# Patient Record
Sex: Female | Born: 1965 | Race: Black or African American | Hispanic: No | Marital: Married | Smoking: Never smoker
Health system: Southern US, Community
[De-identification: ages and names within clinical notes are randomized; demographics above are authoritative.]

## PROBLEM LIST (undated history)

## (undated) DIAGNOSIS — J209 Acute bronchitis, unspecified: Secondary | ICD-10-CM

## (undated) DIAGNOSIS — J45909 Unspecified asthma, uncomplicated: Secondary | ICD-10-CM

## (undated) DIAGNOSIS — R7303 Prediabetes: Secondary | ICD-10-CM

## (undated) DIAGNOSIS — D649 Anemia, unspecified: Secondary | ICD-10-CM

## (undated) DIAGNOSIS — E7800 Pure hypercholesterolemia, unspecified: Secondary | ICD-10-CM

## (undated) DIAGNOSIS — G501 Atypical facial pain: Secondary | ICD-10-CM

## (undated) DIAGNOSIS — M542 Cervicalgia: Secondary | ICD-10-CM

## (undated) DIAGNOSIS — K219 Gastro-esophageal reflux disease without esophagitis: Secondary | ICD-10-CM

## (undated) DIAGNOSIS — J309 Allergic rhinitis, unspecified: Secondary | ICD-10-CM

## (undated) DIAGNOSIS — E119 Type 2 diabetes mellitus without complications: Secondary | ICD-10-CM

## (undated) DIAGNOSIS — R569 Unspecified convulsions: Secondary | ICD-10-CM

## (undated) DIAGNOSIS — R51 Headache: Secondary | ICD-10-CM

## (undated) HISTORY — DX: Cervicalgia: M54.2

## (undated) HISTORY — DX: Acute bronchitis, unspecified: J20.9

## (undated) HISTORY — DX: Allergic rhinitis, unspecified: J30.9

## (undated) HISTORY — PX: ABDOMINAL HYSTERECTOMY: SHX81

## (undated) HISTORY — DX: Headache: R51

## (undated) HISTORY — DX: Gastro-esophageal reflux disease without esophagitis: K21.9

## (undated) HISTORY — DX: Unspecified asthma, uncomplicated: J45.909

## (undated) HISTORY — DX: Atypical facial pain: G50.1

## (undated) HISTORY — DX: Unspecified convulsions: R56.9

## (undated) HISTORY — DX: Pure hypercholesterolemia, unspecified: E78.00

## (undated) HISTORY — DX: Type 2 diabetes mellitus without complications: E11.9

## (undated) HISTORY — PX: EYE SURGERY: SHX253

---

## 1997-12-05 ENCOUNTER — Encounter: Admission: RE | Admit: 1997-12-05 | Discharge: 1998-03-05 | Payer: Self-pay | Admitting: Family Medicine

## 1998-08-16 ENCOUNTER — Emergency Department (HOSPITAL_COMMUNITY): Admission: EM | Admit: 1998-08-16 | Discharge: 1998-08-16 | Payer: Self-pay | Admitting: Emergency Medicine

## 2000-08-20 ENCOUNTER — Encounter: Payer: Self-pay | Admitting: *Deleted

## 2000-08-20 ENCOUNTER — Ambulatory Visit (HOSPITAL_COMMUNITY): Admission: RE | Admit: 2000-08-20 | Discharge: 2000-08-20 | Payer: Self-pay | Admitting: *Deleted

## 2000-09-04 ENCOUNTER — Emergency Department (HOSPITAL_COMMUNITY): Admission: EM | Admit: 2000-09-04 | Discharge: 2000-09-04 | Payer: Self-pay | Admitting: Internal Medicine

## 2000-12-24 ENCOUNTER — Encounter (INDEPENDENT_AMBULATORY_CARE_PROVIDER_SITE_OTHER): Payer: Self-pay | Admitting: *Deleted

## 2000-12-24 ENCOUNTER — Inpatient Hospital Stay (HOSPITAL_COMMUNITY): Admission: AD | Admit: 2000-12-24 | Discharge: 2000-12-27 | Payer: Self-pay | Admitting: *Deleted

## 2000-12-24 ENCOUNTER — Encounter: Payer: Self-pay | Admitting: *Deleted

## 2000-12-31 ENCOUNTER — Inpatient Hospital Stay (HOSPITAL_COMMUNITY): Admission: AD | Admit: 2000-12-31 | Discharge: 2000-12-31 | Payer: Self-pay | Admitting: *Deleted

## 2001-04-02 ENCOUNTER — Other Ambulatory Visit: Admission: RE | Admit: 2001-04-02 | Discharge: 2001-04-02 | Payer: Self-pay | Admitting: Obstetrics and Gynecology

## 2001-10-10 ENCOUNTER — Emergency Department (HOSPITAL_COMMUNITY): Admission: EM | Admit: 2001-10-10 | Discharge: 2001-10-10 | Payer: Self-pay | Admitting: Emergency Medicine

## 2003-12-20 ENCOUNTER — Other Ambulatory Visit: Admission: RE | Admit: 2003-12-20 | Discharge: 2003-12-20 | Payer: Self-pay | Admitting: Obstetrics and Gynecology

## 2004-01-11 ENCOUNTER — Inpatient Hospital Stay (HOSPITAL_COMMUNITY): Admission: RE | Admit: 2004-01-11 | Discharge: 2004-01-14 | Payer: Self-pay | Admitting: Obstetrics and Gynecology

## 2004-01-11 ENCOUNTER — Encounter (INDEPENDENT_AMBULATORY_CARE_PROVIDER_SITE_OTHER): Payer: Self-pay | Admitting: Specialist

## 2004-01-11 ENCOUNTER — Encounter (INDEPENDENT_AMBULATORY_CARE_PROVIDER_SITE_OTHER): Payer: Self-pay | Admitting: *Deleted

## 2004-01-29 HISTORY — PX: VESICOVAGINAL FISTULA CLOSURE W/ TAH: SUR271

## 2004-01-29 HISTORY — PX: CYSTECTOMY: SUR359

## 2004-10-18 ENCOUNTER — Ambulatory Visit: Payer: Self-pay | Admitting: Pulmonary Disease

## 2005-03-10 ENCOUNTER — Ambulatory Visit: Payer: Self-pay | Admitting: Pulmonary Disease

## 2006-03-13 ENCOUNTER — Ambulatory Visit: Payer: Self-pay | Admitting: Pulmonary Disease

## 2006-05-06 ENCOUNTER — Emergency Department (HOSPITAL_COMMUNITY): Admission: EM | Admit: 2006-05-06 | Discharge: 2006-05-06 | Payer: Self-pay | Admitting: Emergency Medicine

## 2006-05-14 ENCOUNTER — Ambulatory Visit: Payer: Self-pay | Admitting: Pulmonary Disease

## 2007-05-20 ENCOUNTER — Ambulatory Visit: Payer: Self-pay | Admitting: Pulmonary Disease

## 2007-06-30 DIAGNOSIS — J309 Allergic rhinitis, unspecified: Secondary | ICD-10-CM | POA: Insufficient documentation

## 2007-06-30 DIAGNOSIS — K219 Gastro-esophageal reflux disease without esophagitis: Secondary | ICD-10-CM

## 2007-10-13 ENCOUNTER — Ambulatory Visit: Payer: Self-pay | Admitting: Pulmonary Disease

## 2007-10-13 ENCOUNTER — Encounter: Payer: Self-pay | Admitting: Adult Health

## 2007-10-13 DIAGNOSIS — M542 Cervicalgia: Secondary | ICD-10-CM

## 2007-10-15 LAB — CONVERTED CEMR LAB
ALT: 21 units/L (ref 0–35)
Basophils Relative: 0.6 % (ref 0.0–1.0)
Bilirubin, Direct: 0.2 mg/dL (ref 0.0–0.3)
CO2: 29 meq/L (ref 19–32)
Eosinophils Absolute: 0.2 10*3/uL (ref 0.0–0.7)
GFR calc non Af Amer: 73 mL/min
Hemoglobin: 13.6 g/dL (ref 12.0–15.0)
Lymphocytes Relative: 24.9 % (ref 12.0–46.0)
MCHC: 33.9 g/dL (ref 30.0–36.0)
MCV: 88.4 fL (ref 78.0–100.0)
Monocytes Absolute: 0.8 10*3/uL (ref 0.1–1.0)
Monocytes Relative: 6.6 % (ref 3.0–12.0)
Neutro Abs: 7.8 10*3/uL — ABNORMAL HIGH (ref 1.4–7.7)
Neutrophils Relative %: 65.8 % (ref 43.0–77.0)
Platelets: 210 10*3/uL (ref 150–400)
RBC: 4.53 M/uL (ref 3.87–5.11)
RDW: 12.6 % (ref 11.5–14.6)
TSH: 1.34 microintl units/mL (ref 0.35–5.50)
WBC: 11.9 10*3/uL — ABNORMAL HIGH (ref 4.5–10.5)

## 2007-12-22 ENCOUNTER — Ambulatory Visit: Payer: Self-pay | Admitting: Pulmonary Disease

## 2007-12-22 DIAGNOSIS — J209 Acute bronchitis, unspecified: Secondary | ICD-10-CM

## 2007-12-22 DIAGNOSIS — R519 Headache, unspecified: Secondary | ICD-10-CM | POA: Insufficient documentation

## 2007-12-22 DIAGNOSIS — R569 Unspecified convulsions: Secondary | ICD-10-CM | POA: Insufficient documentation

## 2007-12-22 DIAGNOSIS — R51 Headache: Secondary | ICD-10-CM

## 2007-12-22 DIAGNOSIS — G501 Atypical facial pain: Secondary | ICD-10-CM | POA: Insufficient documentation

## 2007-12-26 LAB — CONVERTED CEMR LAB
HDL: 61.5 mg/dL (ref >39.0)
Sed Rate: 19 mm/hr (ref 0–22)
VLDL: 9 mg/dL (ref 0–40)

## 2008-03-30 ENCOUNTER — Telehealth: Payer: Self-pay | Admitting: Pulmonary Disease

## 2008-04-03 ENCOUNTER — Ambulatory Visit: Payer: Self-pay | Admitting: Pulmonary Disease

## 2008-10-27 ENCOUNTER — Ambulatory Visit: Payer: Self-pay | Admitting: Pulmonary Disease

## 2008-10-27 LAB — CONVERTED CEMR LAB
ALT: 22 units/L (ref 0–35)
AST: 23 units/L (ref 0–37)
Alkaline Phosphatase: 38 units/L — ABNORMAL LOW (ref 39–117)
BUN: 14 mg/dL (ref 6–23)
Basophils Absolute: 0 10*3/uL (ref 0.0–0.1)
Basophils Relative: 0.4 % (ref 0.0–3.0)
Bilirubin Urine: NEGATIVE
Calcium: 9.4 mg/dL (ref 8.4–10.5)
Cholesterol: 231 mg/dL — ABNORMAL HIGH (ref 0–200)
Creatinine, Ser: 0.8 mg/dL (ref 0.4–1.2)
Direct LDL: 156.6 mg/dL
Eosinophils Absolute: 0.1 10*3/uL (ref 0.0–0.7)
Glucose, Bld: 95 mg/dL (ref 70–99)
HCT: 36.4 % (ref 36.0–46.0)
HDL: 59.1 mg/dL (ref >39.00)
Leukocytes, UA: NEGATIVE
Lymphocytes Relative: 43.6 % (ref 12.0–46.0)
Lymphs Abs: 2.6 10*3/uL (ref 0.7–4.0)
MCHC: 34 g/dL (ref 30.0–36.0)
Monocytes Absolute: 0.5 10*3/uL (ref 0.1–1.0)
Nitrite: NEGATIVE
Platelets: 211 10*3/uL (ref 150.0–400.0)
RBC: 4.16 M/uL (ref 3.87–5.11)
RDW: 12.8 % (ref 11.5–14.6)
Sodium: 143 meq/L (ref 135–145)
Total CHOL/HDL Ratio: 4
Total Protein, Urine: NEGATIVE mg/dL
Urine Glucose: NEGATIVE mg/dL
Urobilinogen, UA: 0.2 (ref 0.0–1.0)
pH: 6.5 (ref 5.0–8.0)

## 2008-11-10 ENCOUNTER — Emergency Department (HOSPITAL_BASED_OUTPATIENT_CLINIC_OR_DEPARTMENT_OTHER): Admission: EM | Admit: 2008-11-10 | Discharge: 2008-11-11 | Payer: Self-pay | Admitting: Emergency Medicine

## 2009-02-10 ENCOUNTER — Encounter: Payer: Self-pay | Admitting: Pulmonary Disease

## 2009-05-07 ENCOUNTER — Ambulatory Visit: Payer: Self-pay | Admitting: Pulmonary Disease

## 2009-05-11 ENCOUNTER — Ambulatory Visit: Payer: Self-pay | Admitting: Pulmonary Disease

## 2009-05-15 ENCOUNTER — Telehealth: Payer: Self-pay | Admitting: Pulmonary Disease

## 2009-05-15 LAB — CONVERTED CEMR LAB
ALT: 18 units/L (ref 0–35)
AST: 19 units/L (ref 0–37)
Bilirubin, Direct: 0.1 mg/dL (ref 0.0–0.3)
Cholesterol: 251 mg/dL — ABNORMAL HIGH (ref 0–200)
Total CHOL/HDL Ratio: 4
Total Protein: 7.9 g/dL (ref 6.0–8.3)
VLDL: 11.6 mg/dL (ref 0.0–40.0)

## 2009-06-01 ENCOUNTER — Emergency Department (HOSPITAL_BASED_OUTPATIENT_CLINIC_OR_DEPARTMENT_OTHER): Admission: EM | Admit: 2009-06-01 | Discharge: 2009-06-01 | Payer: Self-pay | Admitting: Emergency Medicine

## 2010-02-11 ENCOUNTER — Ambulatory Visit: Payer: Self-pay | Admitting: Pulmonary Disease

## 2010-02-11 ENCOUNTER — Ambulatory Visit (HOSPITAL_COMMUNITY): Admission: RE | Admit: 2010-02-11 | Discharge: 2010-02-11 | Payer: Self-pay | Admitting: Gastroenterology

## 2010-02-12 ENCOUNTER — Ambulatory Visit: Payer: Self-pay | Admitting: Pulmonary Disease

## 2010-02-18 LAB — CONVERTED CEMR LAB
ALT: 20 units/L (ref 0–35)
Alkaline Phosphatase: 36 units/L — ABNORMAL LOW (ref 39–117)
BUN: 14 mg/dL (ref 6–23)
Basophils Absolute: 0 10*3/uL (ref 0.0–0.1)
Bilirubin Urine: NEGATIVE
Bilirubin, Direct: 0.1 mg/dL (ref 0.0–0.3)
CO2: 28 meq/L (ref 19–32)
Calcium: 9.6 mg/dL (ref 8.4–10.5)
Chloride: 104 meq/L (ref 96–112)
Creatinine, Ser: 0.8 mg/dL (ref 0.4–1.2)
GFR calc non Af Amer: 94.58 mL/min (ref >60)
Hemoglobin: 12.7 g/dL (ref 12.0–15.0)
Lymphocytes Relative: 46.4 % — ABNORMAL HIGH (ref 12.0–46.0)
Lymphs Abs: 3.4 10*3/uL (ref 0.7–4.0)
MCV: 87.4 fL (ref 78.0–100.0)
Monocytes Absolute: 0.6 10*3/uL (ref 0.1–1.0)
Monocytes Relative: 7.7 % (ref 3.0–12.0)
Neutro Abs: 3.2 10*3/uL (ref 1.4–7.7)
Neutrophils Relative %: 43.7 % (ref 43.0–77.0)
Platelets: 241 10*3/uL (ref 150.0–400.0)
RBC: 4.24 M/uL (ref 3.87–5.11)
RDW: 14 % (ref 11.5–14.6)
Sodium: 141 meq/L (ref 135–145)
Specific Gravity, Urine: 1.02 (ref 1.000–1.030)
Total Protein, Urine: NEGATIVE mg/dL
Triglycerides: 79 mg/dL (ref 0.0–149.0)
VLDL: 15.8 mg/dL (ref 0.0–40.0)
WBC: 7.3 10*3/uL (ref 4.5–10.5)
pH: 6.5 (ref 5.0–8.0)

## 2010-02-28 ENCOUNTER — Ambulatory Visit (HOSPITAL_COMMUNITY): Admission: RE | Admit: 2010-02-28 | Discharge: 2010-02-28 | Payer: Self-pay | Admitting: Gastroenterology

## 2010-03-20 ENCOUNTER — Encounter: Payer: Self-pay | Admitting: Pulmonary Disease

## 2010-04-01 ENCOUNTER — Encounter: Admission: RE | Admit: 2010-04-01 | Discharge: 2010-04-01 | Payer: Self-pay | Admitting: Emergency Medicine

## 2010-04-16 ENCOUNTER — Encounter: Payer: Self-pay | Admitting: Pulmonary Disease

## 2010-04-18 ENCOUNTER — Encounter: Admission: RE | Admit: 2010-04-18 | Discharge: 2010-04-18 | Payer: Self-pay | Admitting: General Surgery

## 2010-05-08 ENCOUNTER — Encounter: Payer: Self-pay | Admitting: Pulmonary Disease

## 2010-05-30 HISTORY — PX: LAPAROSCOPIC CHOLECYSTECTOMY: SUR755

## 2010-06-06 ENCOUNTER — Encounter: Payer: Self-pay | Admitting: Pulmonary Disease

## 2010-06-19 ENCOUNTER — Encounter: Payer: Self-pay | Admitting: Pulmonary Disease

## 2010-07-30 NOTE — Assessment & Plan Note (Signed)
Summary: physical ///kp   CC:  9 month ROV & CPX....  History of Present Illness: 45 y/o BF here for a follow up visit & CPX... she has multiple medical problems as noted below...    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 she states that it started w/ right ear discomfort (no fever, drainage, etc) w/ radiation to her right neck (sternocleidomastoid area to it's insertion on the clavicle)... she has been evaluated at the [facility] on several occas and given antibiotics, nasal sprays, etc- ?some mild relief initially but symptoms always return... she has seen the nurse practitioner w/ Ibuprofen trial- again w/ mild improvement but not consistent relief... the pain in her ear occurs on most days, mod 4-5/10, lasts for sec's to min's, and recurs throughout the day... the neck discomfort seems to be worse in the AM, but is not activity or motion related... she denies visual symptoms, headaches, temporal area pain, or classic tic doloreaux symptomatology... Rx w/ Rest, Heat, Soma250, Vicodin- some improvement but symptoms persist... referred to Neurology but couldn't get appt due to her acct... she will take care of this & set up her appt...   ~  October 27, 2008:  here for CPX- she never f/u w/ Neurology and c/o severe HA recently- we discussed need for Neuro eval and offered to make referral for her but she wants to take care of this on her own (she never did)... using SOMA & VICODIN we have perscribed in the past...   ~  May 07, 2009:  add-on for sinusitis symptoms and to recheck FLP on Simva40 now... tolerating satis, wt= 190# up 3# since last OV... we discussed Rx w/ Depo80, Dosepak, Augmentin, Mucinex, etc...   ~  February 11, 2010:  her for CPX- c/o "stomach issues" x2mo w/ n/v & wakes from sleep once per week... went to [facility], sent to [facility] & w/u in progress w/ gastric emptying scan pending (we don't have records & pt will request notes sent to [place] to review)... she  also has mult somatic complaints- legs ache etc> she stopped Simvastatin, & is taking SOMA 250 + VICODIN Prn.  Current Problem List:  ALLERGIC RHINITIS (ICD-477.9) - she uses OTC antihist and Saline Prn...  Hx of ASTHMATIC BRONCHITIS, ACUTE (ICD-466.0) - takes SYMBICORT 80 2spBid- but just using it Prn (I use it w/ activities)... doing well & denies cough, sputum, hemoptysis, worsening dyspnea,  wheezing, chest pains, snoring, daytime hypersomnolence, etc...   HYPERCHOLESTEROLEMIA, BORDERLINE (ICD-272.4) - prev on Simvastatin but stopped on her own, using diet alone now.  ~  FLP 9/07 showed TChol 219, TG 45, HDL 53, LDL 151...  ~  FLP 6/09 showed TChol 256, TG 46, HDL 62, LDL 176... rec to start Simvast40 but she never filled Rx.  ~  OV 10/09 Rx written for Simvastatin 40mg Qhs...  she states she filled it but not taking regularly.  ~  FLP 4/10 showed TChol 231, TG 69, HDL 59, LDL 157... rec> take Simva40 daily.  ~  FLP 11/10 showed TChol 251, TG 51, HDL 63, LDL 175... rec> incr Simva80, but she stopped on her own.  ~  FLP 8/11 showed TChol 228, TG 79, HDL 57, LDL 165... rec> get back on Simva40 & take every day!  GERD (ICD-530.81) - occas GERD symptoms in the past treated w/ OTC Zantac or Prilosec... no recent problems- swallowing OK, no heartburn, abd pain, n/v, etc...  Hx of HEADACHE (ICD-784.0) -  hx of headaches and neck pain evaluated by Autumn Patty in 1999...  Hx of OTHER CONVULSIONS (ICD-780.39) - she was evaluated by DrStiefel in 1999 for some unusual spells w/ shaking of her left arm and hand, plus paresthesias of her hand and face...full eval w/ MRI and EEG were normal...she was evaluated by DrHickling in 2002 for seizure-like activity around the time of her delivery... 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  ~  OV 4/10: she notes that she still has spells ave 1-2 per month- she feels funny/ eyes twitch/ "I  know when it's going to happen and I sit down", no LOC etc...  FACIAL PAIN, ATYPICAL (ICD-350.2)  ***  SEE ABOVE  *** she never went to see Neurology... NECK PAIN, RIGHT (ICD-723.1)  ~  she takes SOMA 250 Tid Prn and VICODIN Tid Prn for pain...  Health Maintenance -  she states that she needs a GYN doctor- given names and she will call... she had a TAH for fibroids and a left ovarian cystectomy (benign cystic teratoma) in 2005 by DrGottsegen...   Preventive Screening-Counseling & Management  Alcohol-Tobacco     Smoking Status: never  Allergies (verified): No Known Drug Allergies  Comments:  Nurse/Medical Assistant: The patient's medications and allergies were reviewed with the patient and were updated in the Medication and Allergy Lists.  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 Father died- didn't know him, but he had hx of DM Mother alive age 72 w/ HBP 4 Siblings: 2 Brothers- alive & well 2 Sisters- 1= Kristine Linea age __ w/ asthma, allergies, HBP, FM...  Social History: Reviewed history from 10/27/2008 and no changes required. Divorced 3 children- hx asthma, borderline DM Never Smoked Social alcohol Employ= [employer]/ [employer], [employer]  Review of Systems       The patient complains of malaise, nausea, gas/bloating, muscle cramps, and anxiety.  The patient denies fever, chills, sweats, anorexia, fatigue, weakness, weight loss, sleep disorder, blurring, diplopia, eye irritation, eye discharge, vision loss, eye pain,  photophobia, earache, ear discharge, tinnitus, decreased hearing, nasal congestion, nosebleeds, sore throat, hoarseness, chest pain, palpitations, syncope, dyspnea on exertion, orthopnea, PND, peripheral edema, cough, dyspnea at rest, excessive sputum, hemoptysis, wheezing, pleurisy, vomiting, diarrhea, constipation, change in bowel habits, abdominal pain, melena, hematochezia, indigestion/heartburn, dysphagia, odynophagia, dysuria, hematuria, urinary frequency, urinary hesitancy, nocturia, incontinence, back pain, joint pain, joint swelling, muscle weakness, stiffness, arthritis, sciatica, restless legs, leg pain at night, leg pain with exertion, rash, itching, dryness, suspicious lesions, paralysis, paresthesias, seizures, tremors, vertigo, transient blindness, frequent falls, frequent headaches, difficulty walking, depression, memory loss, confusion, cold intolerance, heat intolerance, polydipsia, polyphagia, polyuria, unusual weight change, abnormal bruising, bleeding, enlarged lymph nodes, urticaria, allergic rash, hay fever, and recurrent infections.    Vital Signs:  Patient profile:   45 year old female Height:      70 inches Weight:      207 pounds BMI:     29.81 O2 Sat:      100 % on Room air Temp:     98.0 degrees F oral Pulse rate:  54 / minute BP sitting:   122 / 82  (left arm) Cuff size:   regular  Vitals Entered By: Randell Loop CMA (February 11, 2010 11:03 AM)  O2 Sat at Rest %:  100 O2 Flow:  Room air CC: 9 month ROV & CPX... Is Patient Diabetic? No Pain Assessment Patient in pain? yes      Onset of pain  bil leg pain since sat morning Comments meds udpated today with pt   Physical Exam  Additional Exam:  WD, WN, 44 y/o BF in NAD... GENERAL:  Alert & oriented; pleasant & cooperative... HEENT:  [place]/AT, EOM-wnl, PERRLA, EACs-clear, TMs-wnl, NOSE- sl red, THROAT-clear & wnl. NECK:  Supple w/ fairROM; no JVD; normal carotid impulses w/o bruits; no thyromegaly or nodules  palpated; no lymphadenopathy. CHEST:  Clear to P & A; without wheezes/ rales/ or rhonchi. HEART:  Regular Rhythm; without murmurs/ rubs/ or gallops. ABDOMEN:  Soft & nontender; normal bowel sounds; no organomegaly or masses detected. EXT: without deformities or arthritic changes; no varicose veins/ venous insuffic/ or edema. NEURO:  CN's intact; motor testing normal; sensory testing normal; gait normal & balance OK. DERM:  No lesions noted; no rash etc...    CXR  Procedure date:  02/11/2010  Findings:      CHEST - 2 VIEW Comparison: 10/27/2008   Findings: Heart and mediastinal contours are within normal limits. No focal opacities or effusions.  No acute bony abnormality.   IMPRESSION: No acute cardiopulmonary disease.   Read By:  Charlett Nose,  M.D   EKG  Procedure date:  02/11/2010  Findings:      Sinus bradycardia with rate of:  56/ min... Tracing is WNL, NAD...  SN   MISC. Report  Procedure date:  02/12/2010  Findings:      Lipid Panel (LIPID)   Cholesterol          [H]  228 mg/dL                   0-454   Triglycerides             79.0 mg/dL                  0.9-811.9   HDL                       14.78 mg/dL                 >29.56 Cholesterol LDL - Direct                             164.5 mg/dL  BMP (METABOL)   Sodium                    141 mEq/L                   135-145   Potassium                 4.5 mEq/L                   3.5-5.1   Chloride                  104 mEq/L                   96-112   Carbon Dioxide  28 mEq/L                    19-32   Glucose                   94 mg/dL                    16-10   BUN                       14 mg/dL                    9-60   Creatinine                0.8 mg/dL                   4.5-4.0   Calcium                   9.6 mg/dL                   9.8-11.9   GFR                       94.58 mL/min                >60  Hepatic/Liver Function Panel (HEPATIC)   Total Bilirubin           0.5 mg/dL                    1.4-7.8   Direct Bilirubin          0.1 mg/dL                   2.9-5.6   Alkaline Phosphatase [L]  36 U/L                      39-117   AST                       23 U/L                      0-37   ALT                       20 U/L                      0-35   Total Protein             7.3 g/dL                    2.1-3.0   Albumin                   4.3 g/dL                    8.6-5.7  Comments:      CBC Platelet w/Diff (CBCD)   White Cell Count          7.3 K/uL                    4.5-10.5   Red Cell Count            4.24 Mil/uL  3.87-5.11   Hemoglobin                12.7 g/dL                   16.1-09.6   Hematocrit                37.1 %                      36.0-46.0   MCV                       87.4 fl                     78.0-100.0   Platelet Count            241.0 K/uL                  150.0-400.0   Neutrophil %              43.7 %                      43.0-77.0   Lymphocyte %         [H]  46.4 %                      12.0-46.0   Monocyte %                7.7 %                       3.0-12.0   Eosinophils%              1.5 %                       0.0-5.0   Basophils %               0.7 %                       0.0-3.0  TSH (TSH)   FastTSH                   1.38 uIU/mL                 0.35-5.50  UDip Only (UDIP)   Color                     LT. YELLOW   Clarity                   CLEAR                       Clear   Specific Gravity          1.020                       1.000 - 1.030   Urine Ph                  6.5                         5.0-8.0   Protein                   NEGATIVE  Negative   Urine Glucose             NEGATIVE                    Negative   Ketones                   NEGATIVE                    Negative   Urine Bilirubin           NEGATIVE                    Negative   Blood                     NEGATIVE                    Negative   Urobilinogen              0.2                         0.0 - 1.0   Leukocyte Esterace        NEGATIVE                     Negative   Nitrite                   NEGATIVE                    Negative   Impression & Recommendations:  Problem # 1:  PHYSICAL EXAMINATION (ICD-V70.0)  Orders: EKG w/ Interpretation (93000) T-2 View CXR (71020TC)  Problem # 2:  Hx of ASTHMATIC BRONCHITIS, ACUTE (ICD-466.0) Continue Symbicort... Her updated medication list for this problem includes:    Symbicort 80-4.5 Mcg/act Aero (Budesonide-formoterol fumarate) .Marland Kitchen... 2 inhalations twice daily as directed...  Problem # 3:  HYPERCHOLESTEROLEMIA, BORDERLINE (ICD-272.4) She needs to take the med regularly so we can assess it's efficacy & determine if she needs a stronger statin vs Lipid Clinic referral... Her updated medication list for this problem includes:    Simvastatin 40 Mg Tabs (Simvastatin) .Marland Kitchen... Take 1 tab by mouth at bedtime...  Problem # 4:  GERD (ICD-530.81) New "stomach issues" being eval by Danne Vasek [facility] per pt hx & she will request data to be forwarded to [place] for her records... REC> continue eval & Rx from GI...  Problem # 5:  Hx of HEADACHE (ICD-784.0) Hx HA, "convulsions" & atyp facial pain> should be managed by Neuro & we tried several times to ret her to Neuro for eval but she hasn't followed up w/ them... Her updated medication list for this problem includes:    Vicodin 5-500 Mg Tabs (Hydrocodone-acetaminophen) .Marland Kitchen... Take 1 tab by mouth up to 3 times per day as needed for pain...  Problem # 6:  OTHER MEDICAL PROBLEMS AS NOTED>>>  Complete Medication List: 1)  Symbicort 80-4.5 Mcg/act Aero (Budesonide-formoterol fumarate) .... 2 inhalations twice daily as directed... 2)  Simvastatin 40 Mg Tabs (Simvastatin) .... Take 1 tab by mouth at bedtime.Marland KitchenMarland Kitchen 3)  Vicodin 5-500 Mg Tabs (Hydrocodone-acetaminophen) .... Take 1 tab by mouth up to 3 times per day as needed for pain.Marland KitchenMarland Kitchen 4)  Soma 250 Mg Tabs (Carisoprodol) .... Take 1 tab by mouth up to three times per day as needed for muscle spasm... 5)   Multivitamins Tabs (  Multiple vitamin) .... Take 1 tablet by mouth once a day  Patient Instructions: 1)  Today we updated your med list- see below.... 2)  We refilled your meds as discussed... 3)  Today we did your follow up CXR & EKG... 4)  Please return to our lab one morning this week for your FASTING blood work...  please call the "phone tree" in a few days for your lab results.Marland KitchenMarland Kitchen 5)  Let's get on track w/ our diet (try weight watchers) & exercise program... 6)  Call for any questions.Marland KitchenMarland Kitchen 7)  Please schedule a follow-up appointment in 6 months. Prescriptions: SOMA 250 MG  TABS (CARISOPRODOL) take 1 tab by mouth up to three times per day as needed for muscle spasm...  #90 x 6   Entered and Authorized by:   Michele Mcalpine MD   Signed by:   Michele Mcalpine MD on 02/11/2010   Method used:   Print then Give to Patient   RxID:   [id number] VICODIN 5-500 MG  TABS (HYDROCODONE-ACETAMINOPHEN) take 1 tab by mouth up to 3 times per day as needed for pain...  #90 x 6   Entered and Authorized by:   Michele Mcalpine MD   Signed by:   Michele Mcalpine MD on 02/11/2010   Method used:   Print then Give to Patient   RxID:   [id number] SIMVASTATIN 40 MG TABS (SIMVASTATIN) take 1 tab by mouth at bedtime...  #30 x prn   Entered and Authorized by:   Michele Mcalpine MD   Signed by:   Michele Mcalpine MD on 02/11/2010   Method used:   Print then Give to Patient   RxID:   [id number] SYMBICORT 80-4.5 MCG/ACT  AERO (BUDESONIDE-FORMOTEROL FUMARATE) 2 inhalations twice daily as directed...  #1 x prn   Entered and Authorized by:   Michele Mcalpine MD   Signed by:   Michele Mcalpine MD on 02/11/2010   Method used:   Print then Give to Patient   RxID:   [id number]    Orders Added: 1)  EKG w/ Interpretation [93000] 2)  Est. Patient 40-64 years [99396] 3)  T-2 View CXR [71020TC]    Immunization History:  Influenza Immunization History:    Influenza:  historical (04/12/2009)    CardioPerfect  ECG  ID: [id number] Patient: JACOBI, RYANT DOB: 1966/03/15 Age: 45 Years Old Sex: Female Race: Black Physician: scott nadel Technician: Randell Loop CMA Height: 70 Weight: 207 Status: Unconfirmed Past Medical History:  ALLERGIC RHINITIS (ICD-477.9) Hx of ASTHMATIC BRONCHITIS, ACUTE (ICD-466.0) HYPERCHOLESTEROLEMIA, BORDERLINE (ICD-272.4) GERD (ICD-530.81) Hx of HEADACHE (ICD-784.0) Hx of OTHER CONVULSIONS (ICD-780.39) FACIAL PAIN, ATYPICAL (ICD-350.2) NECK PAIN, RIGHT (ICD-723.1)   Recorded: 02/11/2010 11:28 AM P/PR: 118 ms / 175 ms - Heart rate (maximum exercise) QRS: 88 QT/QTc/QTd: 402 ms / 393 ms / 24 ms - Heart rate (maximum exercise)  P/QRS/T axis: 45 deg / 48 deg / 20 deg - Heart rate (maximum exercise)  Heartrate: 55 bpm  Interpretation:  Sinus bradycardia with rate of:  56/ min... Tracing is WNL, NAD...  SN

## 2010-07-30 NOTE — Consult Note (Signed)
Summary: [facility]  [facility]   Imported By: Sherian Rein 03/28/2010 07:59:23  _____________________________________________________________________  External Attachment:    Type:   Image     Comment:   External Document

## 2010-07-30 NOTE — Letter (Signed)
Summary: [facility]   Imported By: Sherian Rein 05/11/2010 09:57:07  _____________________________________________________________________  External Attachment:    Type:   Image     Comment:   External Document

## 2010-07-30 NOTE — Letter (Signed)
Summary: [facility]   Imported By: Sherian Rein 05/31/2010 13:58:35  _____________________________________________________________________  External Attachment:    Type:   Image     Comment:   External Document

## 2010-08-01 NOTE — Op Note (Signed)
Summary: Adolph Pollack MD  Adolph Pollack MD   Imported By: Lester Nikolaevsk 06/27/2010 08:59:07  _____________________________________________________________________  External Attachment:    Type:   Image     Comment:   External Document

## 2010-08-01 NOTE — Letter (Signed)
Summary: [facility]   Imported By: Sherian Rein 07/12/2010 12:35:02  _____________________________________________________________________  External Attachment:    Type:   Image     Comment:   External Document

## 2010-08-02 NOTE — Procedures (Signed)
Summary: EGD/[facility]  EGD/[facility]   Imported By: Sherian Rein 03/28/2010 08:00:36  _____________________________________________________________________  External Attachment:    Type:   Image     Comment:   External Document

## 2010-08-12 ENCOUNTER — Other Ambulatory Visit: Payer: Self-pay | Admitting: Pulmonary Disease

## 2010-08-12 ENCOUNTER — Ambulatory Visit (INDEPENDENT_AMBULATORY_CARE_PROVIDER_SITE_OTHER): Payer: Self-pay | Admitting: Pulmonary Disease

## 2010-08-12 ENCOUNTER — Other Ambulatory Visit: Payer: 59

## 2010-08-12 ENCOUNTER — Encounter: Payer: Self-pay | Admitting: Pulmonary Disease

## 2010-08-12 DIAGNOSIS — J209 Acute bronchitis, unspecified: Secondary | ICD-10-CM

## 2010-08-12 DIAGNOSIS — E785 Hyperlipidemia, unspecified: Secondary | ICD-10-CM

## 2010-08-12 DIAGNOSIS — J069 Acute upper respiratory infection, unspecified: Secondary | ICD-10-CM

## 2010-08-12 DIAGNOSIS — K219 Gastro-esophageal reflux disease without esophagitis: Secondary | ICD-10-CM

## 2010-08-12 DIAGNOSIS — J309 Allergic rhinitis, unspecified: Secondary | ICD-10-CM

## 2010-08-12 LAB — LIPID PANEL: Cholesterol: 227 mg/dL — ABNORMAL HIGH (ref 0–200)

## 2010-08-18 DIAGNOSIS — E1169 Type 2 diabetes mellitus with other specified complication: Secondary | ICD-10-CM | POA: Insufficient documentation

## 2010-08-18 DIAGNOSIS — E7800 Pure hypercholesterolemia, unspecified: Secondary | ICD-10-CM | POA: Insufficient documentation

## 2010-08-27 NOTE — Assessment & Plan Note (Signed)
Summary: 6 month rov/kp   CC:  6 month ROV & review of mult medical problems....  History of Present Illness: 45 y/o BF here for a follow up visit & CPX... she has multiple medical problems as noted below...    ~  February 11, 2010:  her for CPX- c/o "stomach issues" x18mo w/ n/v & wakes from sleep once per week... went to [facility], sent to [facility] & w/u in progress w/ gastric emptying scan pending (we don't have records & pt will request notes sent to [place] to review)... she also has mult somatic complaints- legs ache etc> she stopped Simvastatin, & is taking SOMA 250 + VICODIN Prn.   ~  August 12, 2010:  DrNat-Mann did GI eval w/ EGD showing sm HH, mild esophagitis (Rx Dexilant);  CT Abd neg x divertics & sm ovarian cysts (s/p hyst);  Hepatobil Scan w/ low norm EF & dx w/ biliary dyskinesia & sent to DrRosenbower for Lap Cholecystectomy & lysis of adhesions 12/11> improved...    Complains of URI w/ ST, congestion & drainage, cough w/ yellow sput & low grade fever etc;  we discussed rx w/ Augmentin, Mucinex (declines- "it makes me woozy"), Hydromet, & nasal saline etc...    She has Hypercholesterolemia but has prev been unable to take Simva40 regularly (see below);  she now states she's been on it regularly but FLP today is no better! therefore we will switch to Crestor 20mg /d...   Current Problem List:  ALLERGIC RHINITIS (ICD-477.9) - she uses OTC antihist and Saline Prn...  Hx of ASTHMATIC BRONCHITIS, ACUTE (ICD-466.0) - takes SYMBICORT 80 2spBid- but just using it Prn (I use it w/ activities)... doing well except for recent symptoms (2/12) as above & we are treating w/ Augmentin, Hydromet, she refuses Mucinex...  HYPERCHOLESTEROLEMIA (ICD-272.0) - on Simvastatin 40mg /d- taking it on & off (freq stops on her own)...  ~  FLP 9/07 showed TChol 219, TG 45, HDL 53, LDL 151...  ~  FLP 6/09 showed TChol 256, TG 46, HDL 62, LDL 176... rec to start Simvast40 but she never filled Rx.  ~  OV 10/09 Rx  written for Simvastatin 40mg Qhs...  she states she filled it but not taking regularly.  ~  FLP 4/10 showed TChol 231, TG 69, HDL 59, LDL 157... rec> take Simva40 daily.  ~  FLP 11/10 showed TChol 251, TG 51, HDL 63, LDL 175... rec> incr Simva80, but she stopped on her own.  ~  FLP 8/11 showed TChol 228, TG 79, HDL 57, LDL 165... rec> get back on Simva40 & take every day!  ~  FLP 2/12 on Simva40 daily she says> TChol 227, TG 98, HDL 57, LDL 158... not working, ch to [employer] 20mg /d.  GERD (ICD-530.81) - occas GERD symptoms in the past treated w/ OTC Zantac or Prilosec... seen by [facility] 2011 w/ EGD 9/11 showing sm HH, mild esophagitis, Rx Dexilant & improved.  Hx of HEADACHE (ICD-784.0) - hx of headaches and neck pain evaluated by DrLove in 1999... FACIAL PAIN, ATYPICAL (ICD-350.2) - hx atyp facial pain in 2009, Rx Soma & Vicodin, she never went to Neuro. NECK PAIN, RIGHT (ICD-723.1) - she takes SOMA 250 Tid Prn and VICODIN Tid Prn for pain (ave 1-2 per month).  Hx of OTHER CONVULSIONS (ICD-780.39) - she was evaluated by DrStiefel in 1999 for some unusual spells w/ shaking of her left arm and hand, plus paresthesias of her hand and face...full eval w/ MRI and EEG were normal...she  was evaluated by DrHickling in 2002 for seizure-like activity around the time of her delivery... 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  ~  OV 4/10: she notes that she still has spells ave 1-2 per month- she feels funny/ eyes twitch/ "I know when it's going to happen and I sit down", no LOC etc...  Health Maintenance -  she states that she needs a GYN doctor- given names and she will call... she had a TAH for fibroids and a left ovarian cystectomy (benign cystic teratoma) in 2005 by DrGottsegen... sm bilat ovarian cysts seen on CT Abd by [facility] 10/11.   Preventive Screening-Counseling & Management  Alcohol-Tobacco     Smoking  Status: never  Allergies (verified): No Known Drug Allergies  Comments:  Nurse/Medical Assistant: The patient's medications and allergies were reviewed with the patient and were updated in the Medication and Allergy Lists.  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 Father died- didn't know him, but he had hx of DM Mother alive age 96 w/ HBP 4 Siblings: 2 Brothers- alive & well 2 Sisters- 1= Kristine Linea age __ w/ asthma, allergies, HBP, FM...  Social History: Reviewed history from 10/27/2008 and no changes required. 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  The patient denies anorexia, fever, weight loss, weight gain, vision loss, decreased hearing, chest pain, syncope, dyspnea on exertion, peripheral edema, headaches, hemoptysis, abdominal pain, melena, hematochezia, severe indigestion/heartburn, hematuria, incontinence, muscle weakness, suspicious skin lesions, transient blindness, difficulty walking, depression, unusual weight change, abnormal bleeding, enlarged lymph nodes, and angioedema.    Vital Signs:  Patient profile:   45 year old female Height:      70 inches Weight:      209.50 pounds BMI:     30.17 O2 Sat:      97 % on Room air Temp:     99.1 degrees F oral Pulse rate:   68 / minute BP sitting:   126 / 84  (left arm) Cuff size:   regular  Vitals  Entered By: Randell Loop CMA (August 12, 2010 9:27 AM)  O2 Sat at Rest %:  97 O2 Flow:  Room air CC: 6 month ROV & review of mult medical problems... Is Patient Diabetic? No Pain Assessment Patient in pain? no      Comments no changes in meds today   Physical Exam  Additional Exam:  WD, WN, 44 y/o BF in NAD... GENERAL:  Alert & oriented; pleasant & cooperative... HEENT:  [place]/AT, EOM-wnl, PERRLA, EACs-clear, TMs-wnl, NOSE- sl red, THROAT-clear & wnl. NECK:  Supple w/ fairROM; no JVD; normal carotid impulses w/o bruits; no thyromegaly or nodules palpated; no lymphadenopathy. CHEST:  Clear to P & A; without wheezes/ rales/ or rhonchi. HEART:  Regular Rhythm; without murmurs/ rubs/ or gallops. ABDOMEN:  Soft & nontender; normal bowel sounds; no organomegaly or masses detected. EXT: without deformities  or arthritic changes; no varicose veins/ venous insuffic/ or edema. NEURO:  CN's intact; motor testing normal; sensory testing normal; gait normal & balance OK. DERM:  No lesions noted; no rash etc...    Impression & Recommendations:  Problem # 1:  UPPER RESPIRATORY INFECTION (ICD-465.9) She has URI & hx AB> will rx w/ Augmentin, Hydromet, states she can't take Mucinex, therefore nasal saline & plenty of fluids... Her updated medication list for this problem includes:    Hydromet 5-1.5 Mg/24ml Syrp (Hydrocodone-homatropine) .Marland Kitchen... 1 tsp by mouth every 6 h as needed for cough...  Problem # 2:  HYPERCHOLESTEROLEMIA (ICD-272.0) FLP today w/ pt stating that she is on the Simva40 regularly> shows no improvement w/ LDL 158...  REC>  change to CRESTOR 20mg /d >> we will call this in for pt. Her updated medication list for this problem includes:    Simvastatin 40 Mg Tabs (Simvastatin) .Marland Kitchen... Take 1 tab by mouth at bedtime...  Orders: TLB-Lipid Panel (80061-LIPID)  Problem # 3:  GERD (ICD-530.81) Improved GI s/p cholecystectomy for biliary dyskinesia...  Problem # 4:  NEURO>>> Stable at  present w/ hx HAs, atyp facial pain, neck pain, etc... she has SOMA & VICODIN for Prn use...  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  Problem # 6:  OTHER MEEICAL PROBLEMS AS NOTED>>>  Complete Medication List: 1)  Symbicort 80-4.5 Mcg/act Aero (Budesonide-formoterol fumarate) .... 2 inhalations twice daily as directed... 2)  Simvastatin 40 Mg Tabs (Simvastatin) .... Take 1 tab by mouth at bedtime.Marland KitchenMarland Kitchen 3)  Vicodin 5-500 Mg Tabs (Hydrocodone-acetaminophen) .... Take 1 tab by mouth up to 3 times per day as needed for pain.Marland KitchenMarland Kitchen 4)  Soma 250 Mg Tabs (Carisoprodol) .... Take 1 tab by mouth up to three times per day as needed for muscle spasm... 5)  Multivitamins Tabs (Multiple vitamin) .... Take 1 tablet by mouth once a day 6)  Augmentin 875-125 Mg Tabs (Amoxicillin-pot clavulanate) .... Take 1 tab by mouth two times a day... 7)  Hydromet 5-1.5 Mg/6ml Syrp (Hydrocodone-homatropine) .Marland Kitchen.. 1 tsp by mouth every 6 h as needed for cough...  Patient Instructions: 1)  Today we updated your med list- see below.... 2)  We wrote new perscriptions for AUGMENTIN to take twice daily for your upper resp infection, and HYDROMET cough syrup for as needed use.Marland KitchenMarland Kitchen 3)  Today we did your follow up Lipid profile on the Simvastatin 40mg /d... please call the "phone tree" in a few days for your lab results.Marland KitchenMarland Kitchen 4)  REMEMBER:  low chol/ low fat diet & incr exercise - the goal is to lose 10-15 lbs!!! 5)  Call for any questions.Marland KitchenMarland Kitchen 6)  Please schedule a follow-up appointment in 6 months. Prescriptions: HYDROMET 5-1.5 MG/5ML SYRP (HYDROCODONE-HOMATROPINE) 1 tsp by mouth every 6 H as needed for cough...  #6 oz x 1   Entered and Authorized by:   Michele Mcalpine MD   Signed by:   Michele Mcalpine MD on 08/12/2010   Method used:   Print then Give to Patient   RxID:   [id number] AUGMENTIN 875-125 MG TABS (AMOXICILLIN-POT  CLAVULANATE) take 1 tab by mouth two times a day...  #14 x 1   Entered and Authorized by:   Michele Mcalpine MD   Signed by:   Michele Mcalpine MD on 08/12/2010   Method used:   Print then Give to Patient   RxID:  [id number]    Immunization History:  Influenza Immunization History:    Influenza:  historical (06/18/2010)

## 2010-09-02 ENCOUNTER — Encounter: Payer: Self-pay | Admitting: Pulmonary Disease

## 2010-09-10 NOTE — Miscellaneous (Signed)
Summary: messages for pt/med change  Clinical Lists Changes  Medications: Changed medication from SIMVASTATIN 40 MG TABS (SIMVASTATIN) take 1 tab by mouth at bedtime... to CRESTOR 20 MG TABS (ROSUVASTATIN CALCIUM) Take 1 tablet by mouth once a day - Signed Rx of CRESTOR 20 MG TABS (ROSUVASTATIN CALCIUM) Take 1 tablet by mouth once a day;  #30 x 6;  Signed;  Entered by: Randell Loop CMA;  Authorized by: Michele Mcalpine MD;  Method used: Telephoned to [facility] [address] [phone]*, [address] [place], [place], [place]  [address], Ph: [phone], Fax: [phone]    Prescriptions: CRESTOR 20 MG TABS (ROSUVASTATIN CALCIUM) Take 1 tablet by mouth once a day  #30 x 6   Entered by:   Randell Loop CMA   Authorized by:   Michele Mcalpine MD   Signed by:   Randell Loop CMA on 09/02/2010   Method used:   Telephoned to ...       [facility] [address] [id number]* (retail)       [address]       [place], [place]  [address]       Ph: [phone]       Fax: [phone]   RxID:   [id number]  i have tried to contact pt since the middle of feb---have left messages for pt to call me back---i called the pharmacy and changed her simvastatin 40mg -- to crestor 20mg  daily---cancelled all refills of the simvastatin---called pt again this am and left a message for her to call me back---if i dont hear from her today i will send out a letter to the pt Randell Loop CMA  September 02, 2010 9:44 AM    pt returned my call and she is aware of change in meds from simvastatin to crestor and pt is aware that this med has been called into her pharmacy----she is aware to call back in 3 months for recheck of lip and hepat to make sure the crestor is working for her Randell Loop CMA  September 02, 2010 10:33 AM

## 2010-11-12 NOTE — Assessment & Plan Note (Signed)
[facility]                             PULMONARY OFFICE NOTE   NAME:Alexis Hartman, Alexis Hartman                    MRN:          [id number]  DATE:05/20/2007                            DOB:          June 29, 1966    HISTORY OF PRESENT ILLNESS:  Patient is a 45 year old African American  female patient of Dr. Jodelle Green who has a known history of allergic  rhinitis and asthmatic bronchitis.  Patient presents today related to  persistent nasal congestion, postnasal drip symptoms and ear fullness.  Patient reports she has had on and off symptoms over the last 3 months,  has been treated 3 times at Urgent Care with different regimens of  antibiotics, nasal steroids and Magic Mouthwash.  Patient reports  symptoms do improve but then return.  Over the last 4 days symptoms have  worsened.  She now has a cough, congestion, sore throat, ear pain and  pressure.  Patient denies any hemoptysis, orthopnea, PND or leg  swelling.   PAST MEDICAL HISTORY:  Reviewed.   CURRENT MEDICATIONS:  Reviewed.   PHYSICAL EXAMINATION:  Patient is a pleasant female in no acute  distress.  She was afebrile with stable vital signs.  HEENT:  Nasal mucosa is erythematous.  Nontender sinuses.  TMs are  normal.  EACs are clear.  NECK:  Supple without cervical adenopathy, no JVD.  Negative nuchal  rigidity.  LUNGS:  Sounds are clear.  CARDIAC:  Regular rate.  ABDOMEN:  Soft and nontender.  EXTREMITIES:  Warm without any edema.   IMPRESSION AND PLAN:  Acute rhinitis flare with an upper respiratory  infection.  Patient is to begin Nasacort AQ 2 puffs twice daily, sample  and prescription were given.  Begin Allegra-D 24 hour daily as needed  for postnasal drip and nasal congestion symptoms.  Add-in Mucinex DM  twice daily.  Prednisone taper over the next week.  Patient will return  back here in 4 weeks or sooner if needed.  Patient was given a  prescription for Omnicef x7 days to have on hold in  case symptoms worsen  with purulent sputum.     Rubye Oaks, NP  Electronically Signed      Lonzo Cloud. Kriste Basque, MD  Electronically Signed   TP/MedQ  DD: 05/20/2007  DT: 05/21/2007  Job #: [id number]

## 2010-11-15 NOTE — H&P (Signed)
[facility]  Patient:    Alexis Hartman, Alexis Hartman                    MRN: [id number] Adm. Date:  [id number] Attending:  Deniece Ree                         History and Physical  HISTORY:                      Patient is a 45 year old gravida 3, para 2 whose estimated date of confinement is December 17, 2000.  Patient was admitted for post dates and for induction.  Patient had demonstrated no prenatal complications. She also desired permanent sterilization which she expressed throughout the pregnancy phase.  At the time of admission she had good fetal heart tones. There were no contractions.  PHYSICAL EXAMINATION  GENERAL:                      Well-developed, well-nourished, gravid female in no acute distress.  HEENT:                        Within normal limits.  NECK:                         Supple.  BREASTS:                       Without masses, tenderness, or discharge.  LUNGS:                        Clear to percussion, auscultation.  HEART:                        Normal sinus rhythm without murmur, rub, or gallop.  ABDOMEN:                      Term gravid, fetal heart beats 136-148 in the left lower quadrant.  EXTREMITIES:                  Within normal limits.  NEUROLOGIC:                   Within normal limits.  PELVIC:                       Cervix 3 cm, vertex, 0 station.                                Artificial rupture of membranes revealed clear fluid.  The interim portion of her pre delivery phase was indicated in that patient began having seizure type activity during which time this was brought under control with the presence of Dr. Harvest Forest.  Further questioning of the spouse revealed patient has had seizure problems for several years.  He indicated she has had numerous neurologic workups including CT scans as well as MRIs and EEGs which he stated has never been diagnosed as this specific problem.  Patient was very calm at this  period of time after the seizure, however, very shortly thereafter she began having more seizure like activity to the point of jerking.  After this had condition for a period of time, she was checked  for a cervical dilatation and realized that the patient was approximately 4-5 cm at that time.  After witnessing the seizure type activity for several minutes, the decision was to proceed with a cesarean section.  The patient was then rushed to the operating room for preparation of a cesarean section during which time in the movement patient continued to have the jerky type motions of seizure activity.  DIAGNOSES:                    Intrauterine pregnancy post dates, multiparity, desire for permanent sterilization, continuous seizure type activity to the point of feeling the infant would be endangered.  PLAN:                         Proceed with cesarean section and bilateral tubal ligation. DD:  12/24/00 TD:  12/24/00 Job: [id number]

## 2010-11-15 NOTE — Consult Note (Signed)
[facility]  Patient:    Alexis Hartman, Alexis Hartman                    MRN: [id number] Adm. Date:  [id number] Attending:  Deniece Ree CC:         Deniece Ree, M.D.  Lonzo Cloud. Kriste Basque, M.D. [facility]   Consultation Report  REASON FOR ADMISSION: I was asked by Deniece Ree, M.D., and Gretta Cool., M.D., to see Alexis Hartman.  The patient is a 46 year old, married, gravida 2, para 3 woman who was admitted post dates today.  I was called to see her around 3:20 p.m. when she was noted to have seizure-like activity as she was being prepared for possible delivery of her baby.  Gretta Cool., M.D., observed rapid twitching of both upper eyelids and conjugate eye movements with a tendency to look upward.  Her upper extremities were flaccid.  She was unresponsive, but continued to breathe throughout the episode, which lasted 2-3 minutes.  This was followed initially by some twitching of the left arm and rapid progression to an alert responsive state. This apparently recurred and the patient was given a total of 10 mg of Valium. On my recommendation, 1 g of Dilantin was given to her.  The patient continued to have seizure activity while Dilantin was being given.  The decision was made to take her to the operating room to deliver the baby by emergency cesarean section.  She was given ______ and briefly succinylcholine to intubate.  This was allowed to pass from her system and she was able to be successfully extubated.  In the postoperative period, the patient has had no seizure-like activity and has been following some simple commands, although she remains quite heavily sedated.  PAST MEDICAL HISTORY:  The patient was last seen in our office on January 08, 1998, for complaints of headache and neck pain.  She was evaluated by Genene Churn. Love, M.D., found to have no focal neurologic deficits, and sent home with a diagnosis of headaches and neck pain with  plans to try her on a course of Relafen and neck exercises.  She was not seen again in follow-up.  The patient was seen by Sonny Dandy, M.D., in January of 1999 with complaints of tingling and paresthesias of her left hand and face followed by shaking of the left arm and hand.  The episodes began with clonic flexion of her fingers and progressed to involve the entire arm with blinking of her eyes.  She was unaware of head deviation.  The episodes last anywhere from 10 seconds to 20 minutes.  She stated that in the aftermath she might be weaker in the left arm.  During the attacks, she said that she could hear, but could not respond.  The attacks occurred twice a month, usually when she was at work.  She was not awakened from sleep by them.  Examination by Sonny Dandy, M.D., found her to be normal.  The patient had a sleep deprived EEG which was normal.  She also had an MRI of the with and without contrast which was normal.  There was no follow-up of this.  The patient was seen initially at [facility] in 1994 by Eulogio Ditch, M.D., again complaining of episodes of ______ creating uncontrollable movements of the left upper extremity that began when while she was having a child in 19.  It occurred one other time between 1987  and 1994 and at that time was occurring once a month from January until she was seen in May.  The episodes would happen at church and in other circumstances and would usually pass after 15 minutes.  She was treated conservatively with cool compresses, but never had episodes of loss of consciousness.  Eulogio Ditch, M.D., felt that she might be having simple partial seizures and recommended MRI and EEG, which were not done.  Altogether, her symptoms had been present for the past 15 years.  She has never been placed on any epileptic medication.  No definite diagnosis of seizures has been made.  She has not been hospitalized,  except for childbirth.  REVIEW OF SYSTEMS:  The patient had a fairly unremarkable pregnancy.  She had steady weight gain from a pregravid weight of 188 pounds to 201 pounds on December 06, 2000.  She had no signs of hypertension, no edema, and no significant changes in her urine.  She is A+, Rh-, sickle trait negative, VDRL nonreactive, rubella immune, hepatitis B surface antigen negative, and HIV nonreactive.  FAMILY HISTORY:  The patients mother is age 63 and in good health.  There is no family history of migraine or epilepsy.  The patients children are healthy.  The infant delivered today appears healthy.  ALLERGIES:  The patient has no known allergies to medications.  SOCIAL HISTORY:  The patient was married in 1998 and went to college for some time.  She works in [employer] fast stream[employer].  She does not use tobacco or alcohol.  PHYSICAL EXAMINATION:  The patient is coming out of surgery.  VITAL SIGNS:  Blood pressure 106/61, resting pulse 72, O2 saturation 100%.  HEENT:  No infection.  NECK:  Supple.  LUNGS:  Clear.  HEART:  No murmurs.  Pulses normal.  ABDOMEN:  Soft and tender, status post C-section.  She has ileus.  EXTREMITIES:  No edema.  NEUROLOGIC:  No aphasia.  The patient was following commands.  She was nodding appropriately.  Cranial nerves:  Round, reactive pupils.  Fundi were normal. Symmetric facial strength.  Motor examination:  She moves all four extremities, wiggles her fingers, and grips well.  Sensation:  Withdrawal x 4. Deep tendon reflexes were brisk.  She had bilateral flexor plantar responses.  IMPRESSION:  Seizure-like behavior, not definitely epilepsy.  780.39  I cannot rule out the possibility of pseudoseizures.  The patient apparently did not respond to Ativan and only responded after she was given ______  PLAN:  Will continue Dilantin for now.  EEG in 10 days at [facility].  If the patient has not focal deficits, I would hold off on  neuro  imaging.  Call me if the patient has further seizures ([phone]) tonight.  My partner is to follow this patient in my absence as needed.  If seizure-like behaviors continue, we may need to transfer her from [facility] to the [facility] Intensive Care Unit. DD:  12/24/00 TD:  12/24/00 Job: [id number]

## 2010-11-15 NOTE — Op Note (Signed)
NAME:  Alexis Hartman, Alexis Hartman                       ACCOUNT NO.:  [id number]   MEDICAL RECORD NO.:  [id number]                   PATIENT TYPE:  INP   LOCATION:  9310                                 FACILITY:  [facility]   PHYSICIAN:  Daniel L. Eda Paschal, M.D.           DATE OF BIRTH:  04-Oct-1965   DATE OF PROCEDURE:  01/11/2004  DATE OF DISCHARGE:                                 OPERATIVE REPORT   PREOPERATIVE DIAGNOSES:  1. Left ovarian neoplasm.  2. Dysmenorrhea.  3. Fibroids versus adenomyosis.   POSTOPERATIVE DIAGNOSES:  1. Benign mature cystic teratoma.  2. Dysmenorrhea.  3. Adenomyosis versus fibroid.   OPERATION:  Total abdominal hysterectomy with left ovarian cystectomy.   SURGEON:  Daniel L. Eda Paschal, M.D.   FIRST ASSISTANT:  Raynald Kemp, M.D.   ANESTHESIA:  General anesthesia.   FINDINGS:  At the time of the surgery, the patient had a large neoplasm of  her left ovary of about 8 cm.  Frozen section was benign mature cystic  teratoma.  There was some viable ovary once the teratoma was removed.  The  other ovary was carefully inspected.  There was no evidence of a bilateral  teratoma and on ultrasound, she had had no suspicious lesions in the ovary.  The uterus was boggy and enlarged and weighed twice normal size at 160 g.  Fallopian tubes were normal.  They had been separated from a previous tubal  ligation. Ileocecal junction was identified and the patient had normal  appendix.   DESCRIPTION OF PROCEDURE:  After adequate general endotracheal anesthesia,  the patient was placed in the supine position, prepped and draped in usual  sterile manner.  Foley catheter was inserted in the patient's bladder.  A  Pfannenstiel incision was made.  The fascia was opened transversely.  The  peritoneum was entered vertically.  Subcutaneous bleeders were clamped and  bovied as encountered.  When the peritoneal cavity was opened, the above  findings were noted.  The peritoneal washings  were obtained and a left  ovarian cystectomy was done.  A sharp incision was made over the capsule of  the ovary and the neoplasm could be successfully dissected free from the  ovary without rupturing it.  It was sent for frozen.  While we were awaiting  for frozen, the ovary was closed with a running 3-0 Prolene which left [place]  with a nice ovary on the left and there was no bleeding noted.  The right  ovary was carefully inspected and was normal as noted above.  Final frozen  section was mature cystic teratoma, benign.  Because the ovary looked normal  and because the ultrasound had shown no suspicious lesion, it was not felt  that bisecting the ovary would help and there was some concern that it would  cause adhesion formation.  A hysterectomy was done because of the patient's  dysmenorrhea.  The round ligaments were bovied and cut.  The vesicouterine  fold of peritoneum and the posterior peritoneum were sharply dissected free.  The utero-ovarian ligaments, round ligaments and fallopian tubes were  clamped, cut, and doubly suture ligated with #1 chromic catgut.  The bladder  flap was advanced by sharp dissection.  The uterine arteries were bilateral  clamped, cut, and doubly suture ligated with #1 chromic catgut.  The  perimetrium was taken down in successive bites by clamping, cutting and  suture ligating with #1 chromic catgut.  Cervicovaginal junction was  identified, trimmed around and the uterus was sent to pathology for tissue  diagnosis after being weighed and weighed 157 g.  Angle sutures were placed  in the angles of the vagina incorporating cardinal ligaments and uterosacral  ligaments for good vault support.  These were done with #1 chromic catgut  and then the cuff was whipped with a 0 Vicryl locking suture.  Two sponge,  needle and instrument counts were correct.  Copious irrigation was done with  sterile saline.  The left adnexa which now was sitting in the cul-de-sac was   sutured to the round ligament with 3-0 Prolene to prevent prolapse and then  the fascia was closed with two running 0 Vicryl.  Copious irrigation was now  done above the fascia and the skin was closed with staples.  The estimated  blood loss for the entire procedure was 100 mL with none replaced.  The  patient tolerated the procedure well and left the operating room in  satisfactory condition draining clear urine from her Foley catheter.                                               Daniel L. Eda Paschal, M.D.    [name]  D:  01/11/2004  T:  01/11/2004  Job:  [id number]

## 2010-11-15 NOTE — Consult Note (Signed)
NAME:  Alexis Hartman, Alexis Hartman                       ACCOUNT NO.:  [id number]   MEDICAL RECORD NO.:  [id number]                   PATIENT TYPE:  SPE   LOCATION:  DFTL                                 FACILITY:  [facility]   PHYSICIAN:  Casimiro Needle L. Thad Ranger, M.D.           DATE OF BIRTH:  July 17, 1965   DATE OF CONSULTATION:  01/11/2004  DATE OF DISCHARGE:                                   CONSULTATION   REQUESTING PHYSICIAN:  1. Quillian Quince, M.D.  2. Rande Brunt. Eda Paschal, M.D.   REASON FOR CONSULTATION:  Possible seizures following surgery.   HISTORY OF PRESENT ILLNESS:  This is an inpatient consultation evaluation of  this existing [facility] patient, a 45 year old woman  who has been seen in the hospital and in subsequent followup in the office  on a few occasions in 2002, for a similar complaint.  She actually had some  seizure-like activity in July 2002, after delivering a baby, and subsequent  workup including MRI and EEG was negative, and she subsequently underwent an  epilepsy monitoring evaluation at [facility] in which the spells were  documented to be not epileptic or psychogenic in origin.  The patient was  admitted today for a hysterectomy, and in the PACU following the procedure  was noted to have seizure-like activity which consisted of fluttering of the  eyes and twitching of the arms.  The patient reports that she would be awake  during these spells, but simply unable to move.  This would last several  minutes then she would come around back to consciousness.  Because these  episodes were persistent, a neurological consultation was subsequently  requested for consideration about management.  The patient at this time  reports some pain in the abdomen after her recent surgery.   PAST MEDICAL HISTORY:  As above.  She denies any other chronic medical  problems, hospitalizations, or surgeries.   Family/social/review of systems, as outlined in the preoperative  admission  H&P.   MEDICATIONS:  She is not on any anticonvulsants and has not been for some  time.  She was not on medications prior to admission to the hospital.   PHYSICAL EXAMINATION:  VITAL SIGNS:  Blood pressure 130/71, pulse 62,  respirations 16, O2 saturation 100%.  GENERAL:  This is an healthy-appearing female laying supine in the hospital  bed in no evident distress.  HEENT:  Head:  Cranium is normocephalic, atraumatic.  Oropharynx is benign.  NECK:  Supple without carotid bruits.  There is no tongue trauma.  HEART:  Regular rate and rhythm without murmur.  NEUROLOGIC:  Mental status:  She is drowsy, but answers to voice.  She is  able to answer questions and follow multi-step commands.  Speech is soft,  but not dysarthric and appropriate in content.  She is fully oriented to  place and time.  Cranial nerves:  Pupils equal and reactive.  Extraocular  movements are full without  nystagmus.  She blinks to threat from both sides.  Face, tongue, and palate move normally and symmetrically.  Motor:  Normal  bulk and tone.  Normal strength in all tested extremity muscles with  expected give-way of the lower extremities due to her recent surgery.  Sensation is intact in all extremities.  Reflexes are 2+ and symmetric.  Toes are downgoing.   During the examination, I witnessed a few episodes lasting about 10 to 20  seconds which consisted of eye fluttering and occasional irregular twitching  movements of the left arm.  She was responsive during these spells, and in  fact vigorously opened her eyes and startled to a loud noise.   IMPRESSION:  Spells.  There is no strong clinical evidence to suggest these  are seizures, and I suspect that they have a psychogenic origin.  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   RECOMMENDATIONS:  I would not treat with anticonvulsants at this time.  If  seizures are  brief and prolonged, it is probably best to simply ignore them.  If they do become prolonged or are refractory, they could be treated with  ammonia capsules and p.r.n. doses of Haldol may also be helpful.  Also, if  they are persistent consider psychiatric consultation.  Thank you for the  consultation.                                               Michael L. Thad Ranger, M.D.    MLR/MEDQ  D:  01/11/2004  T:  01/11/2004  Job:  [id number]

## 2010-11-15 NOTE — Op Note (Signed)
[facility]  Patient:    Alexis Hartman, Alexis Hartman                    MRN: [id number] Proc. Date: 12/24/00 Adm. Date:  [id number] Attending:  Deniece Ree                           Operative Report  PREOPERATIVE DIAGNOSES:       Intrauterine pregnancy with post dates, numerous seizure activity, multiparity, desire for permanent sterilization.  OPERATION:                    Primary low transverse cervical cesarean section, bilateral tubal ligation using the modified Pomeroy procedure.  POSTOPERATIVE DIAGNOSES:      Intrauterine pregnancy with post dates, numerous seizure activity, multiparity, desire for permanent sterilization, viable female infant with Apgars of 8 and 9, cord pH of 7.32.  SURGEON:                      Deniece Ree, M.D.  ASSISTANT:                    Kathreen Cosier, M.D.  ANESTHESIA:                   General by Dr. Harvest Forest.  SERVICE:                      Pediatrics and teaching.  ESTIMATED BLOOD LOSS:         1000 cc.  DRAINS:                       Foley was left to straight drainage.                                Patient tolerated the procedure well and returned to the recovery room in satisfactory condition.  PROCEDURE:                    Patient was taken to the operating room, prepped and draped in the usual fashion for a cesarean section.  Moderate amount of difficulty was ______ in that the patient was going through seizure type activity at that time.  With some difficulty general anesthesia was then performed following which the patient was prepped and draped in the usual fashion.  Pfannenstiel incision was then made.  This was carried down to the fascia at which time the fascia was entered and excised the extent of the incision.  The midline was identified and rectus muscles separated.  Abdominal peritoneum was then entered in a vertical fashion using Metzenbaum scissors. The visceroperitoneum was then excised  bilaterally toward the round ligaments following which the lower uterine segment was scored, entered in the midline, and bluntly dissected open.  Right hand was introduced and the infants head was delivered.  There was a cord around the neck x 1.  This was removed following which the nasopharynx was sucked out with a suction bulb.  Complete delivery was carried out without any problems.  Cord was clamped and the infant turned over to the pediatricians who were in attendance.  Cord pH was also obtained.  The placenta as well as all products of conception were then manually removed from the uterine cavity.  IV Pitocin as well  as IV antibiotics were then begun.  The myometrium was closed using #1 Chromic in a running locking stitch followed by an embrocating stitch including the peritoneum was closed using #1 Chromic in a running stitch.  At this point the right tube was identified, grasped with a Babcock clamp, followed out until the fimbriated end could be identified.  It was knuckled up and ligated using 0 plain catgut in the routine modified Pomeroy procedure type fashion.  This was done likewise on the opposite side.  Both tubal segments were labeled and sent to pathology.  Both tubal stump areas were then cauterized with the use of a cautery.  At this point hemostasis was obtained.  The ovaries bilaterally appeared to be within normal limits.  The abdominal peritoneum was then closed using a #1 Chromic in a running locking stitch followed by closure of the fascia using a #1 Dexon in a running stitch.  The skin was closed with skin staples.  The procedure was terminated.  The patient tolerated the procedure well and is to return to the recovery room.  Patient is being followed at this point by Dr. Sharene Skeans who came in during the course of surgery and will followup from a neurologic standpoint from this point on. DD:  12/24/00 TD:  12/24/00 Job: [id number]

## 2010-11-15 NOTE — Discharge Summary (Signed)
[facility]  Patient:    Alexis Hartman, Alexis Hartman Visit Number: [id number] MRN: [id number]          Service Type: MED Location: [facility] Attending Physician:  Deniece Ree Dictated by:   Kathreen Cosier, M.D. Admit Date:  12/31/2000 Discharge Date: 12/31/2000                             Discharge Summary  HISTORY OF PRESENT ILLNESS:   The patient is a 45 year old, gravida 3, para 2-0-0-2, [facility] December 17, 2000. She was admitted because of postdates. She had no prenatal complications and was admitted for induction.  HOSPITAL COURSE:              The patient had normal vital signs and Dr. Wiliam Ke was called to admission because she started developing seizures. Her spouse said she had seizures for many years but was not on any kind of medication, and she had not been followed by the neurologist in the past. Dr. Wiliam Ke states he was unaware of the history of seizures. She was seen in consult by Dr. Kriste Basque and he felt she had some kind of seizure-like behavior but no definite epilepsy. Because of the seizures, Dr. Wiliam Ke did a primary low transverse cesarean section and tubal ligation on the patient. She had a female, Apgars 8/9, cord pH of 7.32.  Postoperatively, she did well and the neurologist thought she might have been having pseudoseizures. She had no problems after C-section and she was discharged home on the third postoperative day. Her hemoglobin was 9.  DISCHARGE MEDICATIONS:        Tylenol #3.  DISCHARGE FOLLOWUP:           The patient was instructed to return to see Dr. Wiliam Ke in six weeks. Dictated by:   Kathreen Cosier, M.D. Attending Physician:  Deniece Ree DD:  04/01/01 TD:  04/01/01 Job: [id number]

## 2010-11-15 NOTE — Discharge Summary (Signed)
NAME:  Alexis Hartman, Alexis Hartman                       ACCOUNT NO.:  [id number]   MEDICAL RECORD NO.:  [id number]                   PATIENT TYPE:  INP   LOCATION:  9306                                 FACILITY:  [facility]   PHYSICIAN:  Daniel L. Eda Paschal, M.D.           DATE OF BIRTH:  [birth year]   DATE OF ADMISSION:  01/11/2004  DATE OF DISCHARGE:  01/14/2004                                 DISCHARGE SUMMARY   The patient is a 45 year old female who was admitted to the hospital with a  large left adnexal mass, dysmenorrhea and adenomyosis for definitive  surgery.  On the day of admission, she was taken to the operating room and a  total abdominal hysterectomy and left ovarian cystectomy was performed.  Frozen section revealed a benign cystic teratoma.  Postoperatively, she  developed a seizure like activity which had already been diagnosed by her  neurologist.  Her neurologist came by to see her, she was watched in  intensive care overnight because of it but it resolved without specific  treatment.  Other than that, her other problems postoperatively was an  ileus, it responded to IV fluids, Dulcolax suppositories and by the third  postoperative day it had resolved as well. She was discharged on Vicodin,  regular diet, ambulatory activity. She will be seen in the office in four  weeks for followup. Final pathology report revealed benign cystic teratoma  and adenomyosis.   CONDITION ON DISCHARGE:  Improved.   DISCHARGE DIAGNOSES:  Dysmenorrhea, menorrhagia with adenomyosis, benign  cystic teratoma.                                               Daniel L. Eda Paschal, M.D.    [name]  D:  02/01/2004  T:  02/02/2004  Job:  [id number]

## 2011-02-10 ENCOUNTER — Ambulatory Visit (INDEPENDENT_AMBULATORY_CARE_PROVIDER_SITE_OTHER): Payer: 59 | Admitting: Pulmonary Disease

## 2011-02-10 ENCOUNTER — Other Ambulatory Visit: Payer: Self-pay | Admitting: Pulmonary Disease

## 2011-02-10 ENCOUNTER — Encounter: Payer: Self-pay | Admitting: Pulmonary Disease

## 2011-02-10 ENCOUNTER — Other Ambulatory Visit (INDEPENDENT_AMBULATORY_CARE_PROVIDER_SITE_OTHER): Payer: 59

## 2011-02-10 DIAGNOSIS — E7800 Pure hypercholesterolemia, unspecified: Secondary | ICD-10-CM

## 2011-02-10 DIAGNOSIS — R1084 Generalized abdominal pain: Secondary | ICD-10-CM | POA: Insufficient documentation

## 2011-02-10 DIAGNOSIS — R112 Nausea with vomiting, unspecified: Secondary | ICD-10-CM

## 2011-02-10 DIAGNOSIS — M542 Cervicalgia: Secondary | ICD-10-CM

## 2011-02-10 DIAGNOSIS — R768 Other specified abnormal immunological findings in serum: Secondary | ICD-10-CM

## 2011-02-10 DIAGNOSIS — J209 Acute bronchitis, unspecified: Secondary | ICD-10-CM

## 2011-02-10 DIAGNOSIS — K219 Gastro-esophageal reflux disease without esophagitis: Secondary | ICD-10-CM

## 2011-02-10 DIAGNOSIS — R569 Unspecified convulsions: Secondary | ICD-10-CM

## 2011-02-10 DIAGNOSIS — R51 Headache: Secondary | ICD-10-CM

## 2011-02-10 DIAGNOSIS — G501 Atypical facial pain: Secondary | ICD-10-CM

## 2011-02-10 DIAGNOSIS — Z8619 Personal history of other infectious and parasitic diseases: Secondary | ICD-10-CM | POA: Insufficient documentation

## 2011-02-10 LAB — BASIC METABOLIC PANEL
BUN: 16 mg/dL (ref 6–23)
CO2: 27 mEq/L (ref 19–32)
Chloride: 106 mEq/L (ref 96–112)
Creatinine, Ser: 0.9 mg/dL (ref 0.4–1.2)
Glucose, Bld: 106 mg/dL — ABNORMAL HIGH (ref 70–99)
Potassium: 4.7 mEq/L (ref 3.5–5.1)
Sodium: 140 mEq/L (ref 135–145)

## 2011-02-10 LAB — LDL CHOLESTEROL, DIRECT: Direct LDL: 165.9 mg/dL

## 2011-02-10 LAB — CBC WITH DIFFERENTIAL/PLATELET
Basophils Absolute: 0.1 10*3/uL (ref 0.0–0.1)
Basophils Relative: 0.8 % (ref 0.0–3.0)
Eosinophils Relative: 2.5 % (ref 0.0–5.0)
HCT: 39.6 % (ref 36.0–46.0)
Lymphocytes Relative: 54.2 % — ABNORMAL HIGH (ref 12.0–46.0)
MCV: 87.2 fl (ref 78.0–100.0)
Monocytes Absolute: 0.6 10*3/uL (ref 0.1–1.0)
Neutro Abs: 2.6 10*3/uL (ref 1.4–7.7)
Neutrophils Relative %: 34.7 % — ABNORMAL LOW (ref 43.0–77.0)
Platelets: 236 10*3/uL (ref 150.0–400.0)
RBC: 4.54 Mil/uL (ref 3.87–5.11)
RDW: 13.9 % (ref 11.5–14.6)
WBC: 7.4 10*3/uL (ref 4.5–10.5)

## 2011-02-10 LAB — HEPATIC FUNCTION PANEL
ALT: 24 U/L (ref 0–35)
Albumin: 4.4 g/dL (ref 3.5–5.2)
Alkaline Phosphatase: 46 U/L (ref 39–117)
Bilirubin, Direct: 0.1 mg/dL (ref 0.0–0.3)
Total Bilirubin: 0.5 mg/dL (ref 0.3–1.2)

## 2011-02-10 LAB — TSH: TSH: 0.84 u[IU]/mL (ref 0.35–5.50)

## 2011-02-10 LAB — H. PYLORI ANTIBODY, IGG: H Pylori IgG: POSITIVE

## 2011-02-10 MED ORDER — DEXLANSOPRAZOLE 60 MG PO CPDR: 60.0000 mg | DELAYED_RELEASE_CAPSULE | Freq: Every day | ORAL | Status: AC

## 2011-02-10 NOTE — Patient Instructions (Signed)
Today we updated your med list in EPIC...    We wrote a new prescription for Dexilant to take daily...  Since the Crestor seemed to upset your stomach further- you will need the worlds BEST low cholesterol, low fat diet to help your Lipids...  Today we did your follow up fasting blood work...    Please call the [facility]REE in a few days for your results...    Dial [id number] & when prompted enter your patient number followed by the # symbol...    Your patient number is:  [id number]#  We will arrange for a GI follow up w/ your gastroenterologist- DrNat-Mann...  Call for any questions...  Let's plan another general check up in  6months, sooner if needed for problems.Marland KitchenMarland Kitchen

## 2011-02-10 NOTE — Progress Notes (Signed)
Subjective:    Patient ID: Alexis Hartman, female    DOB: [birth year], 45 y.o.   MRN: [id number]  HPI 45 y/o BF here for a follow up visit & CPX... she has multiple medical problems as noted below...   ~  February 11, 2010:  her for CPX- c/o "stomach issues" x55mo w/ n/v & wakes from sleep once per week... went to [facility], sent to [facility] & w/u in progress w/ gastric emptying scan pending (we don't have records & pt will request notes sent to [place] to review)... she also has mult somatic complaints- legs ache etc> she stopped Simvastatin, & is taking SOMA 250 + VICODIN Prn.  ~  August 12, 2010:  DrNat-Mann did GI eval w/ EGD showing sm HH, mild esophagitis (Rx Dexilant);  CT Abd neg x divertics & sm ovarian cysts (s/p hyst);  Hepatobil Scan w/ low norm EF & dx w/ biliary dyskinesia & sent to DrRosenbower for Lap Cholecystectomy & lysis of adhesions 12/11> improved...    Complains of URI w/ ST, congestion & drainage, cough w/ yellow sput & low grade fever etc;  we discussed rx w/ Augmentin, Mucinex (declines- "it makes me woozy"), Hydromet, & nasal saline etc...    She has Hypercholesterolemia but has prev been unable to take Simva40 regularly (see below);  she now states she's been on it regularly but FLP today is no better! therefore we will switch to Crestor 20mg /d...  ~  February 10, 2011:  87mo ROV >>    She notes persistant GI issues w/ abd pain, nausea, vomiting;  She states INTOL to Cres20 due to GI problems & wants to leave this off & try diet alone;      DrRosenbower told her surg prob (lap chole 12/11) wouldn't cure her symptoms;  She hasn't been back to surgeon or GI- DrMann for f/u> prev eval w/ EGD 9/11 showing HH & esophagitis treated w/ Dexilant which helped some.    Taking Naturopathic rx from a cousin- on Aloe Vera juice (2oz per day) & Jarro-zymes (contains lipase, protease, amylase)- 2 w/ each meal & notes that it is definitely helping her BMs but apparently not resolving her pain/ N/ V  issues...    We discussed restarting Dexilant daily; refer back to GI DrMann for further eval; stay off the Crestor for now & rely on diet & wt reduction; see prob list below >>    NOTE:  Labs ret w/ pos HPylori antibodies & we will treat w/ PREV PAK...   Current Problem List:  ALLERGIC RHINITIS (ICD-477.9) - she uses OTC antihist and Saline Prn...  Hx of ASTHMATIC BRONCHITIS, ACUTE (ICD-466.0) - takes SYMBICORT 80 2spBid- but just using it Prn (I use it w/ activities)... doing well except for bronchitic exac 2/12 treated w/ Augmentin, Hydromet, she refuses Mucinex...  HYPERCHOLESTEROLEMIA (ICD-272.0) - prev on Simvastatin 40mg /d but not to goal even when taking fairly regularly; switched to Crestor 20mg /d but she stopped after 70mo stating she was INTOL due to GI issues (but her symptoms really didn't improve off the Crestor). ~  FLP 9/07 showed TChol 219, TG 45, HDL 53, LDL 151... ~  FLP 6/09 showed TChol 256, TG 46, HDL 62, LDL 176... rec to start Simvast40 but she never filled Rx. ~  OV 10/09 Rx written for Simvastatin 40mg Qhs...  she states she filled it but not taking regularly. ~  FLP 4/10 showed TChol 231, TG 69, HDL 59, LDL 157... rec> take Simva40 daily. ~  FLP 11/10 showed TChol 251, TG 51, HDL 63, LDL 175... rec> incr Simva80, but she stopped on her own. ~  FLP 8/11 showed TChol 228, TG 79, HDL 57, LDL 165... rec> get back on Simva40 & take every day! ~  FLP 2/12 on Simva40 daily she says> TChol 227, TG 98, HDL 57, LDL 158... not working, ch to [employer] 20mg /d. ~  Pt reports that she was INTOL to [employer] w/ GI issues & wants to control Chol w/ diet alone for now... ~  FLP 8/12 on diet alone showed TChol 227, TG 78, HDL 58, LDL 166... She declines meds wants to continue diet.  GERD (ICD-530.81) - occas GERD symptoms in the past treated w/ OTC Zantac or Prilosec... seen by [facility] 2011 w/ EGD 9/11 showing sm HH, mild esophagitis, Rx Dexilant & improved. ~  8/12:  Pt ret w/ c/o  persistant abd pain, N/ V, etc; we discussed restart Dexilant & f/u w/ GI, DrNat-Mann;  Labs ret w/ POS HPYLORI Antibodies & treated w/ PrevPak> sent to GI for follow up.  Hx of HEADACHE (ICD-784.0) - hx of headaches and neck pain evaluated by DrLove in 1999... FACIAL PAIN, ATYPICAL (ICD-350.2) - hx atyp facial pain in 2009, Rx Soma & Vicodin, she never went to Neuro. ~  8/12:  She notes "I get a twitch every once in awhile & it lasts 2days"; Soma & Vicodin help... NECK PAIN, RIGHT (ICD-723.1) - she takes SOMA 250 Tid Prn and VICODIN Tid Prn for pain (ave 1-2 per month she says).  Hx of OTHER CONVULSIONS (ICD-780.39) - she was evaluated by DrStiefel in 1999 for some unusual spells w/ shaking of her left arm and hand, plus paresthesias of her hand and face...full eval w/ MRI and EEG were normal...she was evaluated by DrHickling in 2002 for seizure-like activity around the time of her delivery... 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 ~  OV 4/10: she notes that she still has spells ave 1-2 per month- she feels funny/ eyes twitch/ "I know when it's going to happen and I sit down", no LOC etc...  Health Maintenance -  she states that she needs a GYN doctor- given names and she will call... she had a TAH for fibroids and a left ovarian cystectomy (benign cystic teratoma) in 2005 by DrGottsegen... sm bilat ovarian cysts seen on CT Abd by [facility] 10/11.   Current Medications, Allergies, Past Medical History, Past Surgical History, Family History, and Social History were reviewed in [employer] record.    Past Surgical History  Procedure Date  . 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 Eda Paschal  . Cystectomy 01/2004    Dr. Eda Paschal  . Laparoscopic cholecystectomy 05/2010    Dr. Abbey Chatters    Outpatient Encounter Prescriptions as of 02/10/2011  Medication Sig Dispense Refill  .  budesonide-formoterol (SYMBICORT) 80-4.5 MCG/ACT inhaler Inhale 2 puffs into the lungs 2 (two) times daily.        . carisoprodol (SOMA) 250 MG tablet Take 350 mg by mouth 3 (three) times daily. Up to three times a day as needed for spasm       . HYDROcodone-acetaminophen (VICODIN) 2.5-500 MG per tablet Take 1 tablet by mouth every 8 (eight) hours as needed.        . Multiple Vitamin (MULTIVITAMIN) tablet Take 1 tablet by mouth daily.        Marland Kitchen  HYDROcodone-homatropine (HYCODAN) 5-1.5 MG/5ML syrup Take by mouth every 6 (six) hours as needed.        . rosuvastatin (CRESTOR) 20 MG tablet Take 20 mg by mouth daily.        .             << PREV PAK >>           1 dose Bid for 14 days  Rx 8/13 for +HPylori antibody test...      No Known Allergies   Review of Systems        See HPI - all other systems neg except as noted...       The patient complains of hoarseness and prolonged cough.  The patient denies anorexia, fever, weight loss, weight gain, vision loss, decreased hearing, chest pain, syncope, dyspnea on exertion, peripheral edema, headaches, hemoptysis, abdominal pain, melena, hematochezia, severe indigestion/heartburn, hematuria, incontinence, muscle weakness, suspicious skin lesions, transient blindness, difficulty walking, depression, unusual weight change, abnormal bleeding, enlarged lymph nodes, and angioedema.     Objective:   Physical Exam     WD, WN, 45 y/o BF in NAD... GENERAL:  Alert & oriented; pleasant & cooperative... HEENT:  [place]/AT, EOM-wnl, PERRLA, EACs-clear, TMs-wnl, NOSE- sl red, THROAT-clear & wnl. NECK:  Supple w/ fairROM; no JVD; normal carotid impulses w/o bruits; no thyromegaly or nodules palpated; no lymphadenopathy. CHEST:  Clear to P & A; without wheezes/ rales/ or rhonchi. HEART:  Regular Rhythm; without murmurs/ rubs/ or gallops. ABDOMEN:  Soft & essent nontender; normal bowel sounds; no organomegaly or masses detected. EXT: without deformities or arthritic  changes; no varicose veins/ venous insuffic/ or edema. NEURO:  CN's intact; motor testing normal; sensory testing normal; gait normal & balance OK. DERM:  No lesions noted; no rash etc...   Assessment & Plan:   GI> HH, GERD, Hx esophagitis> she is s/p lap chole 12/11 by Clyde Lundborg but he told her that this might not cure her symptoms and sure enough she has recurrent abd discomfort & N/ V intermittently;  She was prev eval for GI by DrNatt-Mann and treated w/ Dexilant which she stopped on her own;  REC> restart Dexilant 60mg /d & follow up w/ DrMann for further eval...  NOTE: LABS RET w/ POS HPYLORI Antibodies> RX w/ PREV PAK Bid for 14 days... f/u for eradication by GI.  Hx AB & AR>  She uses the Symbicort as needed & hasn't needed it recently, states breathing is good...  CHOL>  She is off the Crestor 20mg  after only 1 month trial, states it bothered her stomach but her GI issues continue despite stopping the med; she does not want Chol meds and wants to use diet alone for now...  Hx HAs, Atyp Facial Pain, Neck Pain>  She has intermittent symptoms and is content to use her SOMA & VICODIN w/o additional work up, referrals, etc...  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  Other medical issues as noted.Marland KitchenMarland Kitchen

## 2011-02-11 ENCOUNTER — Other Ambulatory Visit: Payer: Self-pay | Admitting: *Deleted

## 2011-02-11 MED ORDER — AMOXICILL-CLARITHRO-LANSOPRAZ PO MISC: Freq: Two times a day (BID) | ORAL | Status: AC

## 2011-02-11 NOTE — Telephone Encounter (Signed)
Called pt back and lmom to make her aware to hold the dexilant until the prevpac has been finished.

## 2011-02-25 ENCOUNTER — Other Ambulatory Visit: Payer: Self-pay | Admitting: Gastroenterology

## 2011-02-26 ENCOUNTER — Ambulatory Visit
Admission: RE | Admit: 2011-02-26 | Discharge: 2011-02-26 | Disposition: A | Discharge status: Home / Self Care | Payer: 59 | Source: Ambulatory Visit | Attending: Gastroenterology | Admitting: Gastroenterology

## 2011-02-26 MED ORDER — IOHEXOL 300 MG/ML  SOLN: 125.0000 mL | Freq: Once | INTRAMUSCULAR | Status: AC | PRN

## 2011-03-10 ENCOUNTER — Telehealth: Payer: Self-pay | Admitting: Pulmonary Disease

## 2011-03-10 NOTE — Telephone Encounter (Signed)
Called, spoke with pt.  She was seen by Dr. Loreta Ave.  States Dr. Loreta Ave has done several test but still can't find anything to explain pt's symptoms - still having n/v, HA, and abd pain.  Per pt, Dr. Loreta Ave thinks this may be related to stress and suggested to see if pt can take some time off from work to see if this helped with her symptoms.  Pt would like to know if Dr. Kriste Basque thinks this is reasonable and if this would qualify for FMLA before she begins this process.  Please advise.  Thanks!

## 2011-03-11 NOTE — Telephone Encounter (Signed)
Per SN----he can authorize 3 weeks off of work now through end of sept and return early October, but then what?  Does she need to see specialist to help with her nerves, etc?   Or does she want a 2nd opinion at [facility] GI?

## 2011-03-11 NOTE — Telephone Encounter (Signed)
LMTCB

## 2011-03-11 NOTE — Telephone Encounter (Signed)
Pt returned triage's call-please call back at [phone]. Alexis Hartman

## 2011-03-12 NOTE — Telephone Encounter (Signed)
PT CALLED BACK AGAIN- SAYS SHE IS STILL WAITING FOR NURSE TO CALL HER. CALL [phone]. Alexis Hartman

## 2011-03-14 NOTE — Telephone Encounter (Signed)
Patient returning call.

## 2011-03-14 NOTE — Telephone Encounter (Signed)
Called and spoke with pt and she stated that she will drop off the FMLA papers to medical records and take about 2 wks and then she will call us and let us know what she decides as far as 2nd opinion GI appt or to see the specialist  For her nerves.

## 2011-03-14 NOTE — Telephone Encounter (Signed)
[email or web address].Isacc Turney, CMA  

## 2011-03-18 ENCOUNTER — Other Ambulatory Visit: Payer: Self-pay | Admitting: Pulmonary Disease

## 2011-03-18 DIAGNOSIS — R1084 Generalized abdominal pain: Secondary | ICD-10-CM

## 2011-03-18 DIAGNOSIS — R51 Headache: Secondary | ICD-10-CM

## 2011-03-20 ENCOUNTER — Encounter: Payer: Self-pay | Admitting: *Deleted

## 2011-04-01 ENCOUNTER — Telehealth: Payer: Self-pay | Admitting: Pulmonary Disease

## 2011-04-01 NOTE — Telephone Encounter (Signed)
Called, spoke with pt.  She was informed of below statement per leigh.  She verbalized understanding and is aware to keep OV tomorrow with TP.  She verbalized understanding of this and voiced no further questions/cocnerns at this time.

## 2011-04-01 NOTE — Telephone Encounter (Signed)
The first aval. appt they could get her was 11-19 for GI at [facility].  The first set of papers have been filled out for her and sent back but they sent [place] another set of papers that have to be filled out and these have to be done with the pt present so she will need to come in for ov with TP to have these filled out since SN is only here tomorrow and he is booked and out of the office until 10-15.  thanks

## 2011-04-01 NOTE — Telephone Encounter (Signed)
I called and spoke with pt.   States while she was on the phone with the scheduler - Leigh walked by, and she was told Marliss Czar would be working on trying to get her appt at [facility] moved up because SN wanted her to have this while she was out of work.  Pt also states she was calling regarding [employer] forms but was told she would need to come in to have these filled out so they scheduled an appt for tomorrow with TP.  I informed pt I did not see where they had already spoken with Leigh regarding this but I would speak with her and we would work on this and call her back.  Leigh -- pls advise.  Thanks!

## 2011-04-02 ENCOUNTER — Encounter: Payer: Self-pay | Admitting: *Deleted

## 2011-04-02 ENCOUNTER — Ambulatory Visit (INDEPENDENT_AMBULATORY_CARE_PROVIDER_SITE_OTHER): Payer: 59 | Admitting: Adult Health

## 2011-04-02 ENCOUNTER — Encounter: Payer: Self-pay | Admitting: Adult Health

## 2011-04-02 VITALS — BP 148/88 | HR 74 | Temp 97.0°F

## 2011-04-02 DIAGNOSIS — F419 Anxiety disorder, unspecified: Secondary | ICD-10-CM

## 2011-04-02 DIAGNOSIS — K219 Gastro-esophageal reflux disease without esophagitis: Secondary | ICD-10-CM

## 2011-04-02 DIAGNOSIS — Z23 Encounter for immunization: Secondary | ICD-10-CM

## 2011-04-02 DIAGNOSIS — F411 Generalized anxiety disorder: Secondary | ICD-10-CM

## 2011-04-02 NOTE — Patient Instructions (Signed)
We filled out your [employer] paperwork.  Flu shot today Continue on current regimen GERD diet  follow up [facility] GI as planned  Please contact office for sooner follow up if symptoms do not improve or worsen or seek emergency care  Follow up as planned with Dr. Kriste Basque  And As needed

## 2011-04-02 NOTE — Progress Notes (Signed)
Subjective:    Patient ID: Alexis Hartman, female    DOB: [birth year], 45 y.o.   MRN: [id number]  HPI 45 y/o BF here for a follow up visit & CPX... she has multiple medical problems as noted below...   ~  February 11, 2010:  her for CPX- c/o "stomach issues" x60mo w/ n/v & wakes from sleep once per week... went to [facility], sent to [facility] & w/u in progress w/ gastric emptying scan pending (we don't have records & pt will request notes sent to [place] to review)... she also has mult somatic complaints- legs ache etc> she stopped Simvastatin, & is taking SOMA 250 + VICODIN Prn.  ~  August 12, 2010:  DrNat-Mann did GI eval w/ EGD showing sm HH, mild esophagitis (Rx Dexilant);  CT Abd neg x divertics & sm ovarian cysts (s/p hyst);  Hepatobil Scan w/ low norm EF & dx w/ biliary dyskinesia & sent to DrRosenbower for Lap Cholecystectomy & lysis of adhesions 12/11> improved...    Complains of URI w/ ST, congestion & drainage, cough w/ yellow sput & low grade fever etc;  we discussed rx w/ Augmentin, Mucinex (declines- "it makes me woozy"), Hydromet, & nasal saline etc...    She has Hypercholesterolemia but has prev been unable to take Simva40 regularly (see below);  she now states she's been on it regularly but FLP today is no better! therefore we will switch to Crestor 20mg /d...  ~  February 10, 2011:  45mo ROV >>    She notes persistant GI issues w/ abd pain, nausea, vomiting;  She states INTOL to Cres20 due to GI problems & wants to leave this off & try diet alone;      DrRosenbower told her surg prob (lap chole 12/11) wouldn't cure her symptoms;  She hasn't been back to surgeon or GI- DrMann for f/u> prev eval w/ EGD 9/11 showing HH & esophagitis treated w/ Dexilant which helped some.    Taking Naturopathic rx from a cousin- on Aloe Vera juice (2oz per day) & Jarro-zymes (contains lipase, protease, amylase)- 2 w/ each meal & notes that it is definitely helping her BMs but apparently not resolving her pain/ N/ V  issues...    We discussed restarting Dexilant daily; refer back to GI DrMann for further eval; stay off the Crestor for now & rely on diet & wt reduction; see prob list below >>    NOTE:  Labs ret w/ pos HPylori antibodies & we will treat w/ PREV PAK...  ~04/02/2011 Follow up and Insurance paperwork  Pt has a hx of  HH, GERD, Hx esophagitis> she is s/p lap chole 12/11 with persistent recurrent abd ain, She has been evaluate by GI DR. MANN with no improvement despite aggressive GERD tx. Recently tx for H. Pylori with Prev Pack. She has requested a leave of absence from work due to abd pain and has upcoming second opinion at [facility] center in couple of weeks . She needs paperwork filled out today for her work's' insurance to cover her leave.  Please see scanned copy in EMR today.  No bloody stools, urinary symptioms or vomitting.      Current Problem List:  ALLERGIC RHINITIS (ICD-477.9) - she uses OTC antihist and Saline Prn...  Hx of ASTHMATIC BRONCHITIS, ACUTE (ICD-466.0) - takes SYMBICORT 80 2spBid- but just using it Prn (I use it w/ activities)... doing well except for bronchitic exac 2/12 treated w/ Augmentin, Hydromet, she refuses Mucinex...  HYPERCHOLESTEROLEMIA (ICD-272.0) -  prev on Simvastatin 40mg /d but not to goal even when taking fairly regularly; switched to Crestor 20mg /d but she stopped after 12mo stating she was INTOL due to GI issues (but her symptoms really didn't improve off the Crestor). ~  FLP 9/07 showed TChol 219, TG 45, HDL 53, LDL 151... ~  FLP 6/09 showed TChol 256, TG 46, HDL 62, LDL 176... rec to start Simvast40 but she never filled Rx. ~  OV 10/09 Rx written for Simvastatin 40mg Qhs...  she states she filled it but not taking regularly. ~  FLP 4/10 showed TChol 231, TG 69, HDL 59, LDL 157... rec> take Simva40 daily. ~  FLP 11/10 showed TChol 251, TG 51, HDL 63, LDL 175... rec> incr Simva80, but she stopped on her own. ~  FLP 8/11 showed TChol 228, TG 79,  HDL 57, LDL 165... rec> get back on Simva40 & take every day! ~  FLP 2/12 on Simva40 daily she says> TChol 227, TG 98, HDL 57, LDL 158... not working, ch to [employer] 20mg /d. ~  Pt reports that she was INTOL to [employer] w/ GI issues & wants to control Chol w/ diet alone for now... ~  FLP 8/12 on diet alone showed TChol 227, TG 78, HDL 58, LDL 166... She declines meds wants to continue diet.  GERD (ICD-530.81) - occas GERD symptoms in the past treated w/ OTC Zantac or Prilosec... seen by [facility] 2011 w/ EGD 9/11 showing sm HH, mild esophagitis, Rx Dexilant & improved. ~  8/12:  Pt ret w/ c/o persistant abd pain, N/ V, etc; we discussed restart Dexilant & f/u w/ GI, DrNat-Mann;  Labs ret w/ POS HPYLORI Antibodies & treated w/ PrevPak> sent to GI for follow up.  Hx of HEADACHE (ICD-784.0) - hx of headaches and neck pain evaluated by DrLove in 1999... FACIAL PAIN, ATYPICAL (ICD-350.2) - hx atyp facial pain in 2009, Rx Soma & Vicodin, she never went to Neuro. ~  8/12:  She notes "I get a twitch every once in awhile & it lasts 2days"; Soma & Vicodin help... NECK PAIN, RIGHT (ICD-723.1) - she takes SOMA 250 Tid Prn and VICODIN Tid Prn for pain (ave 1-2 per month she says).  Hx of OTHER CONVULSIONS (ICD-780.39) - she was evaluated by DrStiefel in 1999 for some unusual spells w/ shaking of her left arm and hand, plus paresthesias of her hand and face...full eval w/ MRI and EEG were normal...she was evaluated by DrHickling in 2002 for seizure-like activity around the time of her delivery... 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 epilepsy monitoring unit)... ~  OV 4/10: she notes that she still has spells ave 1-2 per month- she feels funny/ eyes twitch/ "I know when it's going to happen and I sit down", no LOC etc...  Health Maintenance -  she states that she needs a GYN doctor- given names and she will call... she had a TAH for fibroids and a left ovarian  cystectomy (benign cystic teratoma) in 2005 by DrGottsegen... sm bilat ovarian cysts seen on CT Abd by [facility] 10/11.   Current Medications, Allergies, Past Medical History, Past Surgical History, Family History, and Social History were reviewed in [employer] record.    Past Surgical History  Procedure Date  . 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 Eda Paschal  . Cystectomy 01/2004    Dr. Eda Paschal  . Laparoscopic cholecystectomy 05/2010  Dr. Abbey Chatters    Outpatient Encounter Prescriptions as of 02/10/2011  Medication Sig Dispense Refill  . budesonide-formoterol (SYMBICORT) 80-4.5 MCG/ACT inhaler Inhale 2 puffs into the lungs 2 (two) times daily.        . carisoprodol (SOMA) 250 MG tablet Take 350 mg by mouth 3 (three) times daily. Up to three times a day as needed for spasm       . HYDROcodone-acetaminophen (VICODIN) 2.5-500 MG per tablet Take 1 tablet by mouth every 8 (eight) hours as needed.        . Multiple Vitamin (MULTIVITAMIN) tablet Take 1 tablet by mouth daily.        Marland Kitchen HYDROcodone-homatropine (HYCODAN) 5-1.5 MG/5ML syrup Take by mouth every 6 (six) hours as needed.        . rosuvastatin (CRESTOR) 20 MG tablet Take 20 mg by mouth daily.        .             << PREV PAK >>           1 dose Bid for 14 days  Rx 8/13 for +HPylori antibody test...      No Known Allergies   Review of Systems         See HPI - all other systems neg except as noted...       The patient complains of hoarseness and prolonged cough.  The patient denies anorexia, fever, weight loss, weight gain, vision loss, decreased hearing, chest pain, syncope, dyspnea on exertion, peripheral edema, headaches, hemoptysis, , melena, hematochezia,  hematuria, incontinence, muscle weakness, suspicious skin lesions, transient blindness, difficulty walking, depression, unusual weight change, abnormal bleeding, enlarged lymph nodes, and angioedema.     Objective:   Physical  Exam      WD, WN, 45 y/o BF in NAD... GENERAL:  Alert & oriented; pleasant & cooperative... HEENT:  [place]/AT, EOM-wnl, PERRLA, EACs-clear, TMs-wnl, NOSE- sl red, THROAT-clear & wnl. NECK:  Supple w/ fairROM; no JVD; normal carotid impulses w/o bruits; no thyromegaly or nodules palpated; no lymphadenopathy. CHEST:  Clear to P & A; without wheezes/ rales/ or rhonchi. HEART:  Regular Rhythm; without murmurs/ rubs/ or gallops. ABDOMEN:  Soft & essent nontender; normal bowel sounds; no organomegaly or masses detected. EXT: without deformities or arthritic changes; no varicose veins/ venous insuffic/ or edema. NEURO:  CN's intact; motor testing normal; sensory testing normal; gait normal & balance OK. DERM:  No lesions noted; no rash etc...   Assessment & Plan:

## 2011-04-09 NOTE — Assessment & Plan Note (Signed)
Persistent GERD and abdominal pain ? Etiology  Awaiting referral to medical center  >34min spent on insurance paperwork and visit.  Plan;  Paperwork completed w/ copy in EMR  GERD diet  follow up [facility] center GI as planned  Please contact office for sooner follow up if symptoms do not improve or worsen or seek emergency care  Follow up as planned with Dr. Kriste Basque  And As ne

## 2011-04-10 ENCOUNTER — Ambulatory Visit (INDEPENDENT_AMBULATORY_CARE_PROVIDER_SITE_OTHER): Payer: 59 | Admitting: Professional

## 2011-04-10 DIAGNOSIS — F411 Generalized anxiety disorder: Secondary | ICD-10-CM

## 2011-04-24 ENCOUNTER — Ambulatory Visit: Payer: 59 | Admitting: Professional

## 2011-05-01 ENCOUNTER — Ambulatory Visit (INDEPENDENT_AMBULATORY_CARE_PROVIDER_SITE_OTHER): Payer: 59 | Admitting: Professional

## 2011-05-01 DIAGNOSIS — F411 Generalized anxiety disorder: Secondary | ICD-10-CM

## 2011-05-15 ENCOUNTER — Ambulatory Visit (INDEPENDENT_AMBULATORY_CARE_PROVIDER_SITE_OTHER): Payer: 59 | Admitting: Professional

## 2011-05-15 DIAGNOSIS — F411 Generalized anxiety disorder: Secondary | ICD-10-CM

## 2011-07-15 ENCOUNTER — Ambulatory Visit (INDEPENDENT_AMBULATORY_CARE_PROVIDER_SITE_OTHER): Payer: 59 | Admitting: Professional

## 2011-07-15 DIAGNOSIS — F411 Generalized anxiety disorder: Secondary | ICD-10-CM

## 2011-08-12 ENCOUNTER — Other Ambulatory Visit (INDEPENDENT_AMBULATORY_CARE_PROVIDER_SITE_OTHER): Payer: 59

## 2011-08-12 ENCOUNTER — Telehealth: Payer: Self-pay | Admitting: Pulmonary Disease

## 2011-08-12 ENCOUNTER — Ambulatory Visit (INDEPENDENT_AMBULATORY_CARE_PROVIDER_SITE_OTHER): Payer: 59 | Admitting: Pulmonary Disease

## 2011-08-12 ENCOUNTER — Encounter: Payer: Self-pay | Admitting: Pulmonary Disease

## 2011-08-12 DIAGNOSIS — K219 Gastro-esophageal reflux disease without esophagitis: Secondary | ICD-10-CM

## 2011-08-12 DIAGNOSIS — R1084 Generalized abdominal pain: Secondary | ICD-10-CM

## 2011-08-12 DIAGNOSIS — E7800 Pure hypercholesterolemia, unspecified: Secondary | ICD-10-CM

## 2011-08-12 DIAGNOSIS — J209 Acute bronchitis, unspecified: Secondary | ICD-10-CM

## 2011-08-12 DIAGNOSIS — R112 Nausea with vomiting, unspecified: Secondary | ICD-10-CM

## 2011-08-12 LAB — HEPATIC FUNCTION PANEL
ALT: 25 U/L (ref 0–35)
Total Bilirubin: 0.7 mg/dL (ref 0.3–1.2)

## 2011-08-12 LAB — LIPID PANEL
Cholesterol: 209 mg/dL — ABNORMAL HIGH (ref 0–200)
HDL: 59.1 mg/dL (ref >39.00)
Triglycerides: 76 mg/dL (ref 0.0–149.0)
VLDL: 15.2 mg/dL (ref 0.0–40.0)

## 2011-08-12 LAB — CBC WITH DIFFERENTIAL/PLATELET
Basophils Relative: 0.7 % (ref 0.0–3.0)
Eosinophils Relative: 2.1 % (ref 0.0–5.0)
HCT: 38.7 % (ref 36.0–46.0)
MCV: 87.1 fl (ref 78.0–100.0)
Monocytes Absolute: 0.8 10*3/uL (ref 0.1–1.0)
Neutrophils Relative %: 34.1 % — ABNORMAL LOW (ref 43.0–77.0)
RBC: 4.44 Mil/uL (ref 3.87–5.11)
WBC: 7.9 10*3/uL (ref 4.5–10.5)

## 2011-08-12 LAB — BASIC METABOLIC PANEL
BUN: 16 mg/dL (ref 6–23)
Calcium: 9.6 mg/dL (ref 8.4–10.5)
Creatinine, Ser: 0.9 mg/dL (ref 0.4–1.2)
GFR: 85.66 mL/min (ref >60.00)

## 2011-08-12 LAB — LDL CHOLESTEROL, DIRECT: Direct LDL: 138.1 mg/dL

## 2011-08-12 NOTE — Patient Instructions (Signed)
Today we updated your med list in our EPIC system...    Continue your current medications the same...  Today we did your follow up fasting blood work...    Please call the PHONE TREE in a few days for your results...    Dial [id number] & when prompted enter your patient number followed by the # symbol...    Your patient number is:  [id number]#  We will arrange for a GI second opinion form [facility]...    Our [facility] will call you w/ this info once it is arranged...  Call for any questions...  Let's plan a follow up appt in 6 months, sooner if needed for problems.Marland KitchenMarland Kitchen

## 2011-08-12 NOTE — Progress Notes (Signed)
Subjective:    Patient ID: Alexis Hartman, female    DOB: 04-01-1966, 46 y.o.   MRN: [id number]  HPI 46 y/o BF here for a follow up visit & CPX... she has multiple medical problems as noted below...   ~  August 12, 2010:  DrNat-Mann did GI eval w/ EGD showing sm HH, mild esophagitis (Rx Dexilant);  CT Abd neg x divertics & sm ovarian cysts (s/p hyst);  Hepatobil Scan w/ low norm EF & dx w/ biliary dyskinesia & sent to DrRosenbower for Lap Cholecystectomy & lysis of adhesions 12/11> improved...    Complains of URI w/ ST, congestion & drainage, cough w/ yellow sput & low grade fever etc;  we discussed rx w/ Augmentin, Mucinex (declines- "it makes me woozy"), Hydromet, & nasal saline etc...    She has Hypercholesterolemia but has prev been unable to take Simva40 regularly (see below);  she now states she's been on it regularly but FLP today is no better! therefore we will switch to Crestor 20mg /d...  ~  February 10, 2011:  88mo ROV >>    She notes persistant GI issues w/ abd pain, nausea, vomiting;  She states INTOL to [employer] due to GI problems & wants to leave this off & try diet alone;      DrRosenbower told her surg (lap chole 12/11) wouldn't cure her symptoms;  She hasn't been back to surgeon or GI- DrMann for f/u> prev eval w/ EGD 9/11 showing HH & esophagitis treated w/ Dexilant which helped some.    Taking Naturopathic rx from a cousin- on Aloe Vera juice (2oz per day) & Jarro-zymes (contains lipase, protease, amylase)- 2 w/ each meal & notes that it is definitely helping her BMs but apparently not resolving her pain/ N/ V issues...    We discussed restarting Dexilant daily; refer back to GI DrMann for further eval; stay off the Crestor for now & rely on diet & wt reduction; see prob list below >>    NOTE:  Labs ret w/ pos HPylori antibodies & we will treat w/ PREV PAK ==> symptoms did not resolve w/ this RX.  ~  August 12, 2011:  88mo ROV & she reports persistent GI symptoms of abd pain/ N&V  (note> reflux symptoms resolved on PPI)... She went to see her gastroenterologist DrNatt-Mann but is totally frustrated because all she told her was to continue the Dexilant- even when pt mentions that her abd pain, N&V continue despite Dexilant- all the doctor will tell her is to continue the Dexilant, no offer for further eval, no offer for other medication, no offer for 2nd opinion at medical center;  Note: we do not have records from [facility]... Pt requests further eval for her persistent symptoms and we will refer to [facility] for 2nd opinion...    Fasting labs today showed LDL= 138 & pt rec to try low dose Crestor 5mg /d... Chems=wnl, CBC=wnl, Sed=15...  See prob list below>>          Problem List:  ALLERGIC RHINITIS (ICD-477.9) - she uses OTC antihist and Saline Prn...  Hx of ASTHMATIC BRONCHITIS, ACUTE (ICD-466.0) - takes SYMBICORT 80 2spBid- but just using it Prn (I use it w/ activities)... doing well except for bronchitic exac 2/12 treated w/ Augmentin, Hydromet, she refuses Mucinex...  HYPERCHOLESTEROLEMIA (ICD-272.0) - prev on Simvastatin 40mg /d but not to goal even when taking fairly regularly; switched to Crestor 20mg /d but she stopped after 33mo stating she was INTOL due to GI issues (  but her symptoms really didn't improve off the Crestor). ~  FLP 9/07 showed TChol 219, TG 45, HDL 53, LDL 151... ~  FLP 6/09 showed TChol 256, TG 46, HDL 62, LDL 176... rec to start Simvast40 but she never filled Rx. ~  OV 10/09 Rx written for Simvastatin 40mg Qhs...  she states she filled it but not taking regularly. ~  FLP 4/10 showed TChol 231, TG 69, HDL 59, LDL 157... rec> take Simva40 daily. ~  FLP 11/10 showed TChol 251, TG 51, HDL 63, LDL 175... rec> incr Simva80, but she stopped on her own. ~  FLP 8/11 showed TChol 228, TG 79, HDL 57, LDL 165... rec> get back on Simva40 & take every day! ~  FLP 2/12 on Simva40 daily she says> TChol 227, TG 98, HDL 57, LDL 158... not working, ch to [employer]  20mg /d. ~  Pt reports that she was INTOL to [employer] w/ GI issues & wants to control Chol w/ diet alone for now... ~  FLP 8/12 on diet alone showed TChol 227, TG 78, HDL 58, LDL 166... She declines meds wants to continue diet. ~  FLP 2/13 on diet alone showed TChol 209, TG 76, HDL 59, LDL 138... Rec to try CRESTOR 5mg /d...  GERD (ICD-530.81) - occas GERD symptoms in the past treated w/ OTC Zantac or Prilosec... seen by [facility] 2011 w/ EGD 9/11 showing sm HH, mild esophagitis, Rx Dexilant & improved... ~  8/12 & 2/13:  Pt ret w/ c/o persistant abd pain, N/ V, etc; we discussed restart Dexilant & f/u w/ GI, DrNat-Mann;  Labs ret w/ POS HPYLORI Antibodies & treated w/ PrevPak> sent to GI for follow up but pt very frustrated- no further w/u from [facility], pt told to just continue the Dexilant (reflux is better) but Abd pain N&V continue & pt wants further eval & requests 2nd opinion consult at [facility] GI Dept ...  Hx of HEADACHE (ICD-784.0) - hx of headaches and neck pain evaluated by DrLove in 1999... FACIAL PAIN, ATYPICAL (ICD-350.2) - hx atyp facial pain in 2009, Rx SOMA & VICODIN, she never went to Neuro. ~  8/12:  She notes "I get a twitch every once in awhile & it lasts 2days"; Soma & Vicodin help... NECK PAIN, RIGHT (ICD-723.1) - she takes SOMA 250 Tid Prn and VICODIN Tid Prn for pain (ave 1-2 per month she says).  Hx of OTHER CONVULSIONS (ICD-780.39) - she was evaluated by DrStiefel in 1999 for some unusual spells w/ shaking of her left arm and hand, plus paresthesias of her hand and face...full eval w/ MRI and EEG were normal...she was evaluated by DrHickling in 2002 for seizure-like activity around the time of her delivery... 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 ~  OV 4/10: she notes that she still has spells ave 1-2 per month- she feels funny/ eyes twitch/ "I know when it's going to happen and I sit down", no LOC  etc...  Health Maintenance -  she has been told that she needs a GYN doctor- given names and she will call... she had a TAH for fibroids and a left ovarian cystectomy (benign cystic teratoma) in 2005 by DrGottsegen... sm bilat ovarian cysts seen on CT Abd by [facility] 10/11.   Past Surgical History  Procedure Date  . 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 Eda Paschal  . Cystectomy 01/2004  Dr. Eda Paschal  . Laparoscopic cholecystectomy 05/2010    Dr. Abbey Chatters    Outpatient Encounter Prescriptions as of 08/12/2011  Medication Sig Dispense Refill  . budesonide-formoterol (SYMBICORT) 80-4.5 MCG/ACT inhaler Inhale 2 puffs into the lungs 2 (two) times daily.        . carisoprodol (SOMA) 250 MG tablet Take 350 mg by mouth 3 (three) times daily. Up to three times a day as needed for spasm       . fish oil-omega-3 fatty acids 1000 MG capsule Take 2 g by mouth daily.        Marland Kitchen HYDROcodone-acetaminophen (VICODIN) 2.5-500 MG per tablet Take 1 tablet by mouth every 8 (eight) hours as needed.        Marland Kitchen HYDROcodone-homatropine (HYCODAN) 5-1.5 MG/5ML syrup Take by mouth every 6 (six) hours as needed.        . Multiple Vitamin (MULTIVITAMIN) tablet Take 1 tablet by mouth daily.          No Known Allergies   Current Medications, Allergies, Past Medical History, Past Surgical History, Family History, and Social History were reviewed in [employer] record.    Review of Systems        See HPI - all other systems neg except as noted...       The patient complains of hoarseness and prolonged cough.  The patient denies anorexia, fever, weight loss, weight gain, vision loss, decreased hearing, chest pain, syncope, dyspnea on exertion, peripheral edema, headaches, hemoptysis, abdominal pain, melena, hematochezia, severe indigestion/heartburn, hematuria, incontinence, muscle weakness, suspicious skin lesions, transient blindness, difficulty walking, depression, unusual  weight change, abnormal bleeding, enlarged lymph nodes, and angioedema.     Objective:   Physical Exam     WD, WN, 45 y/o BF in NAD... GENERAL:  Alert & oriented; pleasant & cooperative... HEENT:  [place]/AT, EOM-wnl, PERRLA, EACs-clear, TMs-wnl, NOSE- sl red, THROAT-clear & wnl. NECK:  Supple w/ fairROM; no JVD; normal carotid impulses w/o bruits; no thyromegaly or nodules palpated; no lymphadenopathy. CHEST:  Clear to P & A; without wheezes/ rales/ or rhonchi. HEART:  Regular Rhythm; without murmurs/ rubs/ or gallops. ABDOMEN:  Soft & essent nontender; normal bowel sounds; no organomegaly or masses detected. EXT: without deformities or arthritic changes; no varicose veins/ venous insuffic/ or edema. NEURO:  CN's intact; motor testing normal; sensory testing normal; gait normal & balance OK. DERM:  No lesions noted; no rash etc...  RADIOLOGY DATA:  Reviewed in the EPIC EMR & discussed w/ the patient...  LABORATORY DATA:  Reviewed in the EPIC EMR & discussed w/ the patient...   Assessment & Plan:   GI> HH, GERD, Hx esophagitis> she is s/p lap chole 12/11 by Clyde Lundborg but he told her that this might not cure her symptoms and sure enough she has recurrent abd discomfort & N/ V intermittently;  She was prev eval for GI by [facility] and treated w/ Dexilant which she stopped on her own;  We prev rec restart Dexilant 60mg /d & follow up w/ DrMann for further eval (NOTE: LABS RET w/ POS HPYLORI Antibodies> RX w/ PREV PAK Bid for 14 days)... Pt is very frustrated by follow up w/ her gastroenterologist & not satisfied w/ her work up for abd pain/ N&V ==> requesting 2nd opinion consult w/ GI at [facility] & we will set this up for her...  Hx AB & AR>  She uses the Symbicort as needed & hasn't needed it recently, states breathing is good...  CHOL>  Follow  up FLP on diet alone shows LDL=138 & she is rec to try low dose statin Rx w/ Crestor 5mg /d, plus diet & exercise...  Hx HAs, Atyp Facial Pain, Neck  Pain>  She has intermittent symptoms and is content to use her SOMA & VICODIN w/o additional work up, referrals, etc...  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  Other medical issues as noted...   Patient's Medications  New Prescriptions   No medications on file  Previous Medications   BUDESONIDE-FORMOTEROL (SYMBICORT) 80-4.5 MCG/ACT INHALER    Inhale 2 puffs into the lungs 2 (two) times daily.     CARISOPRODOL (SOMA) 250 MG TABLET    Take 350 mg by mouth 3 (three) times daily. Up to three times a day as needed for spasm    FISH OIL-OMEGA-3 FATTY ACIDS 1000 MG CAPSULE    Take 2 g by mouth daily.     HYDROCODONE-ACETAMINOPHEN (VICODIN) 2.5-500 MG PER TABLET    Take 1 tablet by mouth every 8 (eight) hours as needed.     HYDROCODONE-HOMATROPINE (HYCODAN) 5-1.5 MG/5ML SYRUP    Take by mouth every 6 (six) hours as needed.     MULTIPLE VITAMIN (MULTIVITAMIN) TABLET    Take 1 tablet by mouth daily.    Modified Medications   No medications on file  Discontinued Medications   No medications on file

## 2011-08-12 NOTE — Telephone Encounter (Signed)
Leigh, I do not see where you called pt.  Please advise, thanks!

## 2011-08-12 NOTE — Telephone Encounter (Signed)
i did not call this pt.  We sent an order for her to see GI at [facility].  Not sure if it was [facility] calling her or not.  thanks

## 2011-08-12 NOTE — Telephone Encounter (Signed)
Per Almyra Free send to her bc she tried calling pt. Will do. Please advise libby thanks

## 2011-08-15 NOTE — Telephone Encounter (Signed)
Yes pt is aware of her appt in [place]

## 2011-08-15 NOTE — Telephone Encounter (Signed)
Alexis Hartman have you already spoke with pt. Please advise thanks

## 2011-08-18 ENCOUNTER — Other Ambulatory Visit: Payer: Self-pay | Admitting: *Deleted

## 2011-08-18 MED ORDER — ROSUVASTATIN CALCIUM 5 MG PO TABS: 5.0000 mg | ORAL_TABLET | Freq: Every day | ORAL | Status: DC

## 2011-08-20 ENCOUNTER — Telehealth: Payer: Self-pay | Admitting: Pulmonary Disease

## 2011-08-20 MED ORDER — AMOXICILLIN-POT CLAVULANATE 875-125 MG PO TABS: 1.0000 | ORAL_TABLET | Freq: Two times a day (BID) | ORAL | Status: AC

## 2011-08-20 MED ORDER — PREDNISONE 5 MG PO TABS: ORAL_TABLET | ORAL | Status: DC

## 2011-08-20 NOTE — Telephone Encounter (Signed)
Per SN okay to call in Augmentin 875 mg 1 po BID #14, prednisone 5 mg 6 day pak, otc mucinex 2 po BID w/ plenty of fluids, and take OTC align QD while on the antibiotic.--I have sent rx's into the pharmacy  [facility] x1 to make pt aware of this.

## 2011-08-20 NOTE — Telephone Encounter (Signed)
Called spoke with patient who c/o head congestion, swollen neck glands on left side and sore when she turns her head, PND, dull achy HA, lightheaded, increased SOB, some nasal drainage with dark yellow mucus x3days.  Denies f/c/s.  Pt aware SN has no openings and is okay with script being called in.  Last ov with SN 2.12.13.  Upcoming 01/2012.  NKDA.  [facility] [address].  Dr Kriste Basque please advise, thanks!

## 2011-08-20 NOTE — Telephone Encounter (Signed)
I spoke with pt and is aware of SN recs. She voiced her understanding and had no questions 

## 2011-08-20 NOTE — Telephone Encounter (Signed)
Returning call can be reached at [phone].Alexis Hartman

## 2011-09-02 ENCOUNTER — Ambulatory Visit (INDEPENDENT_AMBULATORY_CARE_PROVIDER_SITE_OTHER): Payer: 59 | Admitting: Professional

## 2011-09-02 DIAGNOSIS — F411 Generalized anxiety disorder: Secondary | ICD-10-CM

## 2012-02-13 ENCOUNTER — Other Ambulatory Visit (INDEPENDENT_AMBULATORY_CARE_PROVIDER_SITE_OTHER): Payer: 59

## 2012-02-13 ENCOUNTER — Encounter: Payer: Self-pay | Admitting: Pulmonary Disease

## 2012-02-13 ENCOUNTER — Ambulatory Visit (INDEPENDENT_AMBULATORY_CARE_PROVIDER_SITE_OTHER): Payer: 59 | Admitting: Pulmonary Disease

## 2012-02-13 VITALS — BP 120/80 | HR 50 | Temp 97.0°F | Ht 70.0 in | Wt 201.2 lb

## 2012-02-13 DIAGNOSIS — J309 Allergic rhinitis, unspecified: Secondary | ICD-10-CM

## 2012-02-13 DIAGNOSIS — J209 Acute bronchitis, unspecified: Secondary | ICD-10-CM

## 2012-02-13 DIAGNOSIS — K219 Gastro-esophageal reflux disease without esophagitis: Secondary | ICD-10-CM

## 2012-02-13 DIAGNOSIS — R51 Headache: Secondary | ICD-10-CM

## 2012-02-13 DIAGNOSIS — M542 Cervicalgia: Secondary | ICD-10-CM

## 2012-02-13 DIAGNOSIS — E7800 Pure hypercholesterolemia, unspecified: Secondary | ICD-10-CM

## 2012-02-13 DIAGNOSIS — R569 Unspecified convulsions: Secondary | ICD-10-CM

## 2012-02-13 LAB — LIPID PANEL
HDL: 59.5 mg/dL (ref >39.00)
VLDL: 15.4 mg/dL (ref 0.0–40.0)

## 2012-02-13 LAB — HEPATIC FUNCTION PANEL
Albumin: 4.3 g/dL (ref 3.5–5.2)
Bilirubin, Direct: 0.1 mg/dL (ref 0.0–0.3)
Total Protein: 7.7 g/dL (ref 6.0–8.3)

## 2012-02-13 LAB — LDL CHOLESTEROL, DIRECT: Direct LDL: 148.6 mg/dL

## 2012-02-13 MED ORDER — CARISOPRODOL 250 MG PO TABS: 350.0000 mg | ORAL_TABLET | Freq: Three times a day (TID) | ORAL | Status: DC

## 2012-02-13 MED ORDER — HYDROCODONE-ACETAMINOPHEN 2.5-500 MG PO TABS: 1.0000 | ORAL_TABLET | Freq: Three times a day (TID) | ORAL | Status: DC | PRN

## 2012-02-13 NOTE — Patient Instructions (Addendum)
Today we updated your med list in our EPIC system...    Continue your current medications the same...  Congrats on the fabulous response to HERBALIFE!!!  Today we did your follow up FASTING Lipid profile...    We will call you w/ the results...  Call for any questions...  Let's plan another follow up visit in  6months.Marland KitchenMarland Kitchen

## 2012-02-13 NOTE — Progress Notes (Signed)
Subjective:    Patient ID: Alexis Hartman, female    DOB: 12-14-1965, 46 y.o.   MRN: [id number]  HPI 46 y/o BF here for a follow up visit... Alexis Hartman has multiple medical problems as noted below...   ~  August 12, 2010:  DrNat-Mann did GI eval w/ EGD showing sm HH, mild esophagitis (Rx Dexilant);  CT Abd neg x divertics & sm ovarian cysts (s/p hyst);  Hepatobil Scan w/ low norm EF & dx w/ biliary dyskinesia & sent to DrRosenbower for Lap Cholecystectomy & lysis of adhesions 12/11> improved...    Complains of URI w/ ST, congestion & drainage, cough w/ yellow sput & low grade fever etc;  we discussed rx w/ Augmentin, Mucinex (declines- "it makes me woozy"), Hydromet, & nasal saline etc...    Alexis Hartman has Hypercholesterolemia but has prev been unable to take Simva40 regularly (see below);  Alexis Hartman now states Alexis Hartman's been on it regularly but FLP today is no better! therefore we will switch to Crestor 20mg /d...  ~  February 10, 2011:  29mo ROV >>    Alexis Hartman notes persistant GI issues w/ abd pain, nausea, vomiting;  Alexis Hartman states INTOL to Cres20 due to GI problems & wants to leave this off & try diet alone;      DrRosenbower told her surg (lap chole 12/11) wouldn't cure her symptoms;  Alexis Hartman hasn't been back to surgeon or GI- DrMann for f/u> prev eval w/ EGD 9/11 showing HH & esophagitis treated w/ Dexilant which helped some.    Taking Naturopathic rx from a cousin- on Aloe Vera juice (2oz per day) & Jarro-zymes (contains lipase, protease, amylase)- 2 w/ each meal & notes that it is definitely helping her BMs but apparently not resolving her pain/ N/ V issues...    We discussed restarting Dexilant daily; refer back to GI DrMann for further eval; stay off the Crestor for now & rely on diet & wt reduction; see prob list below >>    NOTE:  Labs ret w/ pos HPylori antibodies & we will treat w/ PREV PAK ==> symptoms did not resolve w/ this RX.  ~  August 12, 2011:  29mo ROV & Alexis Hartman reports persistent GI symptoms of abd pain/ N&V (note>  reflux symptoms resolved on PPI)... Alexis Hartman went to see her gastroenterologist DrNatt-Mann but is totally frustrated because all Alexis Hartman told her was to continue the Dexilant- even when Alexis Hartman mentions that her abd pain, N&V continue despite Dexilant- all the doctor will tell her is to continue the Dexilant, no offer for further eval, no offer for other medication, no offer for 2nd opinion at medical center;  Note: we do not have records from [facility]... Alexis Hartman requests further eval for her persistent symptoms and we will refer to [facility] for 2nd opinion...    Fasting labs today showed LDL= 138 & Alexis Hartman rec to try low dose Crestor 5mg /d... Chems=wnl, CBC=wnl, Sed=15...  See prob list below>>  ~  February 13, 2012:  29mo ROV & Alexis Hartman reports that Alexis Hartman saw DrFina et al at [facility] 4/13 for GI consultation- "they did a digestive study" Alexis Hartman didn't have colonoscopy but Alexis Hartman reports that all tests were neg & they agreed w/ Dexilant therapy (we do not have notes from [facility]);  [name] says her nephew started selling Herballife products and Alexis Hartman is delighted to report that Alexis Hartman has lost 15# & feels great on these supplements; all prev GI symptoms have resolved & Alexis Hartman feels much better!    Alexis Hartman  notes some mild nasal congestion & uses OTC meds as needed; on Symbicort & encouraged to use it mor regularly; Chol has been treated w/ low dose Crestor & FishOil but f/u FLP shows that Alexis Hartman is still not at goals (reminded to take it every day); Alexis Hartman continues to use Soma & Vicodin for pain averaging only 1-2 per month Alexis Hartman says.    We reviewed prob list, meds, xrays and labs> see below for updates >> LABS 8/13:  FLP- not at goals w/ TChol=218 LDL 149 on Cres5...          Problem List:  ALLERGIC RHINITIS (ICD-477.9) - Alexis Hartman uses OTC antihist and Saline Prn...  Hx of ASTHMATIC BRONCHITIS, ACUTE (ICD-466.0) - takes SYMBICORT 80 2spBid- but just using it Prn (I use it w/ activities)... doing well except for bronchitic exac 2/12 treated w/ Augmentin, Hydromet, Alexis Hartman  refuses Mucinex...  HYPERCHOLESTEROLEMIA (ICD-272.0) - prev on Simvastatin 40mg /d but not to goal even when taking fairly regularly; switched to Crestor 20mg /d but Alexis Hartman stopped after 64mo stating Alexis Hartman was INTOL due to GI issues (but her symptoms really didn't improve off the Crestor). ~  FLP 9/07 showed TChol 219, TG 45, HDL 53, LDL 151... ~  FLP 6/09 showed TChol 256, TG 46, HDL 62, LDL 176... rec to start Simvast40 but Alexis Hartman never filled Rx. ~  OV 10/09 Rx written for Simvastatin 40mg Qhs...  Alexis Hartman states Alexis Hartman filled it but not taking regularly. ~  FLP 4/10 showed TChol 231, TG 69, HDL 59, LDL 157... rec> take Simva40 daily. ~  FLP 11/10 showed TChol 251, TG 51, HDL 63, LDL 175... rec> incr Simva80, but Alexis Hartman stopped on her own. ~  FLP 8/11 showed TChol 228, TG 79, HDL 57, LDL 165... rec> get back on Simva40 & take every day! ~  FLP 2/12 on Simva40 daily Alexis Hartman says> TChol 227, TG 98, HDL 57, LDL 158... not working, ch to [employer] 20mg /d. ~  Alexis Hartman reports that Alexis Hartman was INTOL to [employer] w/ GI issues & wants to control Chol w/ diet alone for now... ~  FLP 8/12 on diet alone showed TChol 227, TG 78, HDL 58, LDL 166... Alexis Hartman declines meds wants to continue diet. ~  FLP 2/13 on diet alone showed TChol 209, TG 76, HDL 59, LDL 138... Rec to try CRESTOR 5mg /d... ~  FLP 8/13 ?on Cres5 showed TChol 218, TG 77, HDL 60, LDL 149... rec to take Cres5 every day + diet/exercise.  GERD (ICD-530.81) - occas GERD symptoms in the past treated w/ OTC Zantac or Prilosec... seen by [facility] 2011 w/ EGD 9/11 showing sm HH, mild esophagitis, Rx Dexilant & improved... ~  8/12 & 2/13:  Alexis Hartman ret w/ c/o persistant abd pain, N/ V, etc; we discussed restart Dexilant & f/u w/ GI, DrNat-Mann;  Labs ret w/ POS HPYLORI Antibodies & treated w/ PrevPak> sent to GI for follow up but Alexis Hartman very frustrated- no further w/u from [facility], Alexis Hartman told to just continue the Dexilant (reflux is better) but Abd pain N&V continue & Alexis Hartman wants further eval & requests 2nd  opinion consult at [facility] GI Dept ... ~  8/13:  Alexis Hartman reports GI eval at [facility] 4/13> we don't have notes from them- Alexis Hartman states they did "digestive studies" & everything was neg, they rec Dexilant rx; Alexis Hartman started taking Herbalife products w/ resolution of all her GI symptoms & now doing well...  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 ~  8/12:  Alexis Hartman notes "I get a twitch every once in awhile & it lasts 2days"; Soma & Vicodin help... NECK PAIN, RIGHT (ICD-723.1) - Alexis Hartman takes SOMA 250 Tid Prn and VICODIN Tid Prn for pain (ave 1-2 per month Alexis Hartman says).  Hx of OTHER CONVULSIONS (ICD-780.39) - Alexis Hartman was evaluated by DrStiefel in 1999 for some unusual spells w/ shaking of her left arm and hand, plus paresthesias of her hand and face...full eval w/ MRI and EEG were normal...Alexis Hartman was evaluated by DrHickling in 2002 for seizure-like activity around the time of her delivery... 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 ~  OV 4/10: Alexis Hartman notes that Alexis Hartman still has spells ave 1-2 per month- Alexis Hartman feels funny/ eyes twitch/ "I know when it's going to happen and I sit down", no LOC etc...  Health Maintenance -  Alexis Hartman has been told that Alexis Hartman needs a GYN doctor- given names and Alexis Hartman will call... Alexis Hartman had a TAH for fibroids and a left ovarian cystectomy (benign cystic teratoma) in 2005 by DrGottsegen... sm bilat ovarian cysts seen on CT Abd by [facility] 10/11.   Past Surgical History  Procedure Date  . 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 Eda Paschal  . Cystectomy 01/2004    Dr. Eda Paschal  . Laparoscopic cholecystectomy 05/2010    Dr. Abbey Chatters    Outpatient Encounter Prescriptions as of 02/13/2012  Medication Sig Dispense Refill  . budesonide-formoterol (SYMBICORT) 80-4.5 MCG/ACT  inhaler Inhale 2 puffs into the lungs 2 (two) times daily.        . carisoprodol (SOMA) 250 MG tablet Take 350 mg by mouth 3 (three) times daily. Up to three times a day as needed for spasm       . fish oil-omega-3 fatty acids 1000 MG capsule Take 2 g by mouth daily.        Marland Kitchen HYDROcodone-acetaminophen (VICODIN) 2.5-500 MG per tablet Take 1 tablet by mouth every 8 (eight) hours as needed.        . Multiple Vitamin (MULTIVITAMIN) tablet Take 1 tablet by mouth daily.        . rosuvastatin (CRESTOR) 5 MG tablet Take 1 tablet (5 mg total) by mouth at bedtime.  90 tablet  3  . DISCONTD: HYDROcodone-homatropine (HYCODAN) 5-1.5 MG/5ML syrup Take by mouth every 6 (six) hours as needed.        Marland Kitchen DISCONTD: predniSONE (DELTASONE) 5 MG tablet 6 day pack take as directed  21 tablet  0    No Known Allergies   Current Medications, Allergies, Past Medical History, Past Surgical History, Family History, and Social History were reviewed in [employer] record.    Review of Systems        See HPI - all other systems neg except as noted...       The patient complains of hoarseness and prolonged cough.  The patient denies anorexia, fever, weight loss, weight gain, vision loss, decreased hearing, chest pain, syncope, dyspnea on exertion, peripheral edema, headaches, hemoptysis, abdominal pain, melena, hematochezia, severe indigestion/heartburn, hematuria, incontinence, muscle weakness, suspicious skin lesions, transient blindness, difficulty walking, depression, unusual weight change, abnormal bleeding, enlarged lymph nodes, and angioedema.     Objective:   Physical Exam     WD, WN, 46 y/o  BF in NAD... GENERAL:  Alert & oriented; pleasant & cooperative... HEENT:  Lyle/AT, EOM-wnl, PERRLA, EACs-clear, TMs-wnl, NOSE- sl red, THROAT-clear & wnl. NECK:  Supple w/ fairROM; no JVD; normal carotid impulses w/o bruits; no thyromegaly or nodules palpated; no lymphadenopathy. CHEST:  Clear to P & A;  without wheezes/ rales/ or rhonchi. HEART:  Regular Rhythm; without murmurs/ rubs/ or gallops. ABDOMEN:  Soft & essent nontender; normal bowel sounds; no organomegaly or masses detected. EXT: without deformities or arthritic changes; no varicose veins/ venous insuffic/ or edema. NEURO:  CN's intact; motor testing normal; sensory testing normal; gait normal & balance OK. DERM:  No lesions noted; no rash etc...  RADIOLOGY DATA:  Reviewed in the EPIC EMR & discussed w/ the patient...  LABORATORY DATA:  Reviewed in the EPIC EMR & discussed w/ the patient...   Assessment & Plan:   GI> HH, GERD, Hx esophagitis> see above- Alexis Hartman had GI eval DrMann, then 2nd opinion at [facility] treated for +HPylori w/ Prevpak, & advised to stay on Dexilant;  Alexis Hartman decided to take Herbalife products & now feels much better w/ these supplements...  Hx AB & AR>  Alexis Hartman uses the Symbicort as needed & hasn't needed it recently, states breathing is good...  CHOL>  On low dose statin Rx w/ Crestor 5mg /d, plus diet & exercise> FLP not at goal  Hx HAs, Atyp Facial Pain, Neck Pain>  Alexis Hartman has intermittent symptoms and is content to use her SOMA & VICODIN w/o additional work up, referrals, etc...  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  Other medical issues as noted...   Patient's Medications  New Prescriptions   No medications on file  Previous Medications   BUDESONIDE-FORMOTEROL (SYMBICORT) 80-4.5 MCG/ACT INHALER    Inhale 2 puffs into the lungs 2 (two) times daily.     FISH OIL-OMEGA-3 FATTY ACIDS 1000 MG CAPSULE    Take 2 g by mouth daily.     MULTIPLE VITAMIN (MULTIVITAMIN) TABLET    Take 1 tablet by mouth daily.     ROSUVASTATIN (CRESTOR) 5 MG TABLET    Take 1 tablet (5 mg total) by mouth at bedtime.  Modified Medications   Modified Medication Previous Medication    CARISOPRODOL (SOMA) 250 MG TABLET carisoprodol (SOMA) 250 MG tablet      Take 1.5 tablets (375 mg total) by mouth 3 (three) times daily. Up to three times a day as needed for spasm    Take 350 mg by mouth 3 (three) times daily. Up to three times a day as needed for spasm    HYDROCODONE-ACETAMINOPHEN (VICODIN) 2.5-500 MG PER TABLET HYDROcodone-acetaminophen (VICODIN) 2.5-500 MG per tablet      Take 1 tablet by mouth 3 (three) times daily as needed.    Take 1 tablet by mouth every 8 (eight) hours as needed.    Discontinued Medications   HYDROCODONE-HOMATROPINE (HYCODAN) 5-1.5 MG/5ML SYRUP    Take by mouth every 6 (six) hours as needed.     PREDNISONE (DELTASONE) 5 MG TABLET    6 day pack take as directed

## 2012-05-06 ENCOUNTER — Encounter (HOSPITAL_BASED_OUTPATIENT_CLINIC_OR_DEPARTMENT_OTHER): Payer: Self-pay | Admitting: *Deleted

## 2012-05-06 ENCOUNTER — Emergency Department (HOSPITAL_BASED_OUTPATIENT_CLINIC_OR_DEPARTMENT_OTHER)
Admission: EM | Admit: 2012-05-06 | Discharge: 2012-05-06 | Disposition: A | Discharge status: Home / Self Care | Payer: 59 | Attending: Emergency Medicine | Admitting: Emergency Medicine

## 2012-05-06 DIAGNOSIS — S61209A Unspecified open wound of unspecified finger without damage to nail, initial encounter: Secondary | ICD-10-CM | POA: Insufficient documentation

## 2012-05-06 DIAGNOSIS — Z8669 Personal history of other diseases of the nervous system and sense organs: Secondary | ICD-10-CM | POA: Insufficient documentation

## 2012-05-06 DIAGNOSIS — Y939 Activity, unspecified: Secondary | ICD-10-CM | POA: Insufficient documentation

## 2012-05-06 DIAGNOSIS — Z79899 Other long term (current) drug therapy: Secondary | ICD-10-CM | POA: Insufficient documentation

## 2012-05-06 DIAGNOSIS — Y929 Unspecified place or not applicable: Secondary | ICD-10-CM | POA: Insufficient documentation

## 2012-05-06 DIAGNOSIS — K219 Gastro-esophageal reflux disease without esophagitis: Secondary | ICD-10-CM | POA: Insufficient documentation

## 2012-05-06 DIAGNOSIS — S61219A Laceration without foreign body of unspecified finger without damage to nail, initial encounter: Secondary | ICD-10-CM

## 2012-05-06 DIAGNOSIS — E7800 Pure hypercholesterolemia, unspecified: Secondary | ICD-10-CM | POA: Insufficient documentation

## 2012-05-06 DIAGNOSIS — W269XXA Contact with unspecified sharp object(s), initial encounter: Secondary | ICD-10-CM | POA: Insufficient documentation

## 2012-05-06 NOTE — ED Provider Notes (Signed)
History     CSN: [id number]  Arrival date & time 05/06/12  1445   None     Chief Complaint  Patient presents with  . Laceration    (Consider location/radiation/quality/duration/timing/severity/associated sxs/prior treatment) Patient is a 46 y.o. female presenting with skin laceration. The history is provided by the patient. No language interpreter was used.  Laceration  The incident occurred less than 1 hour ago. Pain location: right thumb. Size: 5mm. The laceration mechanism was a a metal edge. The pain is mild. Possible foreign bodies include metal. Her tetanus status is UTD.    Past Medical History  Diagnosis Date  . Allergic rhinitis, cause unspecified   . Acute bronchitis   . Pure hypercholesterolemia   . Esophageal reflux   . Headache   . Other convulsions   . Atypical face pain   . Cervicalgia     Past Surgical History  Procedure Date  . 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 Eda Paschal  . Cystectomy 01/2004    Dr. Eda Paschal  . Laparoscopic cholecystectomy 05/2010    Dr. Abbey Chatters    Family History  Problem Relation Age of Onset  . Diabetes Father   . Hypertension Mother   . Asthma Sister     History  Substance Use Topics  . Smoking status: Never Smoker   . Smokeless tobacco: Not on file  . Alcohol Use: No    OB History    Grav Para Term Preterm Abortions TAB SAB Ect Mult Living                  Review of Systems  All other systems reviewed and are negative.    Allergies  Review of patient's allergies indicates no known allergies.  Home Medications   Current Outpatient Rx  Name  Route  Sig  Dispense  Refill  . BUDESONIDE-FORMOTEROL FUMARATE 80-4.5 MCG/ACT IN AERO   Inhalation   Inhale 2 puffs into the lungs 2 (two) times daily.           Marland Kitchen CARISOPRODOL 250 MG PO TABS   Oral   Take 1.5 tablets (375 mg total) by mouth 3 (three) times daily. Up to three times a day as needed for spasm   90 tablet   5   . OMEGA-3  FATTY ACIDS 1000 MG PO CAPS   Oral   Take 2 g by mouth daily.           Marland Kitchen HYDROCODONE-ACETAMINOPHEN 2.5-500 MG PO TABS   Oral   Take 1 tablet by mouth 3 (three) times daily as needed.   90 tablet   5   . ONE-DAILY MULTI VITAMINS PO TABS   Oral   Take 1 tablet by mouth daily.           Marland Kitchen ROSUVASTATIN CALCIUM 5 MG PO TABS   Oral   Take 1 tablet (5 mg total) by mouth at bedtime.   90 tablet   3     BP 124/78  Pulse 67  Temp 98.9 F (37.2 C) (Oral)  Resp 20  SpO2 99%  Physical Exam  Nursing note and vitals reviewed. Constitutional: She is oriented to person, place, and time. She appears well-developed and well-nourished.  Musculoskeletal: She exhibits tenderness.  Neurological: She is alert and oriented to person, place, and time. She has normal reflexes.  Skin: Skin is dry.       5 mm laceration tip of right thumb, no gapping  Psychiatric:  She has a normal mood and affect.    ED Course  LACERATION REPAIR Date/Time: 05/06/2012 3:26 PM Performed by: Elson Areas Authorized by: Elson Areas Consent: Verbal consent not obtained. Risks and benefits: risks, benefits and alternatives were discussed Consent given by: patient Required items: required blood products, implants, devices, and special equipment available Time out: Immediately prior to procedure a "time out" was called to verify the correct patient, procedure, equipment, support staff and site/side marked as required. Body area: upper extremity Location details: right thumb Laceration length: 0.5 cm Foreign bodies: no foreign bodies Vascular damage: no Skin closure: glue Dressing: 4x4 sterile gauze Patient tolerance: Patient tolerated the procedure well with no immediate complications.   (including critical care time)  Labs Reviewed - No data to display No results found.   No diagnosis found.    MDM  Pt counseled on wound care        Lonia Skinner [place], [place] 05/06/12 1528

## 2012-05-06 NOTE — ED Notes (Signed)
Laceration to her right thumb this am on a kitchen utensil blade. Cannot get the bleeding to stop with pressure.

## 2012-05-07 NOTE — ED Provider Notes (Signed)
Medical screening examination/treatment/procedure(s) were performed by non-physician practitioner and as supervising physician I was immediately available for consultation/collaboration.   Carleene Cooper III, MD 05/07/12 [phone]

## 2012-05-14 IMAGING — NM NM HEPATO W/GB/PHARM/[PERSON_NAME]
2 series · 12 of 12 positions shown · non-contrast
Comparison: No prior nuclear imaging.  Abdominal ultrasound
earlier same date.

CLINICAL DATA: Abdominal pain.  Nausea and vomiting.  Normal
abdominal ultrasound earlier same date.

NUCLEAR MEDICINE HEPATOBILIARY IMAGING WITH GALLBLADDER EF
02/28/2010:
TECHNIQUE: Sequential images of the abdomen were obtained [DATE] minutes following intravenous administration of
radiopharmaceutical.  After slow intravenous infusion of
micrograms Cholecystokinin, gallbladder ejection fraction was
determined.
Radiopharmaceutical:  5.2 mCi 1c-KKm Choletec

[he hepatobiliary · 3.43mm/px · 6 of 47 frames shown (1 of 2)]
[frame 4/47]
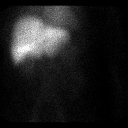
[frame 12/47]
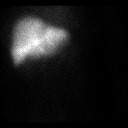
[frame 20/47]
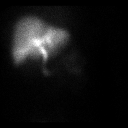
[frame 28/47]
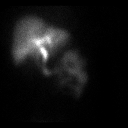
[frame 36/47]
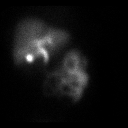
[frame 44/47]
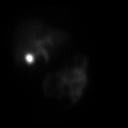

[he hepatobiliary · 3.43mm/px · 6 of 30 frames shown (2 of 2)]
[frame 3/30]
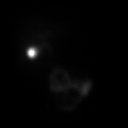
[frame 8/30]
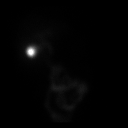
[frame 13/30]
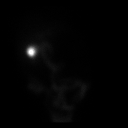
[frame 18/30]
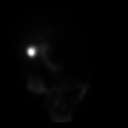
[frame 23/30]
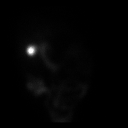
[frame 28/30]
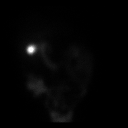

[12 of 12 positions shown; findings below may reference images not displayed]

FINDINGS: Hepatic uptake of Choletec is normal.  Intrahepatic bile
ducts visualized within the first 10 minutes.  Common bile duct
visualized within the first 15 minutes.  Small bowel activity
identified within the first 20 minutes.  Gallbladder activity
visualized at approximately 40 minutes, with normal filling of the
gallbladder throughout the remainder of the imaging sequence.  No
hepatic retention of the radiopharmaceutical.

Gallbladder ejection fraction measured 35% at 30 minutes (normal is
greater than or equal to 30%).

The patient experienced no symptoms during CCK infusion.
IMPRESSION: 1.  Normal hepatobiliary imaging.
2.  Low normal gallbladder ejection fraction of 35%.
3.  No symptoms during cholecystokinin infusion.

## 2012-08-18 ENCOUNTER — Ambulatory Visit: Payer: 59 | Admitting: Pulmonary Disease

## 2013-04-15 ENCOUNTER — Ambulatory Visit: Payer: 59 | Admitting: Pulmonary Disease

## 2013-05-13 ENCOUNTER — Encounter: Payer: Self-pay | Admitting: Pulmonary Disease

## 2013-05-13 ENCOUNTER — Ambulatory Visit (INDEPENDENT_AMBULATORY_CARE_PROVIDER_SITE_OTHER): Payer: 59 | Admitting: Pulmonary Disease

## 2013-05-13 ENCOUNTER — Ambulatory Visit (INDEPENDENT_AMBULATORY_CARE_PROVIDER_SITE_OTHER)
Admission: RE | Admit: 2013-05-13 | Discharge: 2013-05-13 | Disposition: A | Discharge status: Home / Self Care | Payer: 59 | Source: Ambulatory Visit | Attending: Pulmonary Disease | Admitting: Pulmonary Disease

## 2013-05-13 ENCOUNTER — Other Ambulatory Visit (INDEPENDENT_AMBULATORY_CARE_PROVIDER_SITE_OTHER): Payer: 59

## 2013-05-13 ENCOUNTER — Encounter (INDEPENDENT_AMBULATORY_CARE_PROVIDER_SITE_OTHER): Payer: Self-pay

## 2013-05-13 VITALS — BP 122/70 | HR 64 | Temp 98.2°F | Ht 70.0 in | Wt 211.6 lb

## 2013-05-13 DIAGNOSIS — Z0000 Encounter for general adult medical examination without abnormal findings: Secondary | ICD-10-CM

## 2013-05-13 DIAGNOSIS — R569 Unspecified convulsions: Secondary | ICD-10-CM

## 2013-05-13 DIAGNOSIS — E7800 Pure hypercholesterolemia, unspecified: Secondary | ICD-10-CM

## 2013-05-13 DIAGNOSIS — K219 Gastro-esophageal reflux disease without esophagitis: Secondary | ICD-10-CM

## 2013-05-13 DIAGNOSIS — R51 Headache: Secondary | ICD-10-CM

## 2013-05-13 DIAGNOSIS — J209 Acute bronchitis, unspecified: Secondary | ICD-10-CM

## 2013-05-13 DIAGNOSIS — J309 Allergic rhinitis, unspecified: Secondary | ICD-10-CM

## 2013-05-13 DIAGNOSIS — M542 Cervicalgia: Secondary | ICD-10-CM

## 2013-05-13 DIAGNOSIS — R0789 Other chest pain: Secondary | ICD-10-CM

## 2013-05-13 LAB — LIPID PANEL
Cholesterol: 243 mg/dL — ABNORMAL HIGH (ref 0–200)
Total CHOL/HDL Ratio: 4
Triglycerides: 68 mg/dL (ref 0.0–149.0)

## 2013-05-13 LAB — CBC WITH DIFFERENTIAL/PLATELET
Basophils Absolute: 0 10*3/uL (ref 0.0–0.1)
Basophils Relative: 0.6 % (ref 0.0–3.0)
Hemoglobin: 13.4 g/dL (ref 12.0–15.0)
Lymphocytes Relative: 54.4 % — ABNORMAL HIGH (ref 12.0–46.0)
Monocytes Relative: 9 % (ref 3.0–12.0)
Neutro Abs: 2.7 10*3/uL (ref 1.4–7.7)
RBC: 4.65 Mil/uL (ref 3.87–5.11)

## 2013-05-13 LAB — BASIC METABOLIC PANEL
BUN: 13 mg/dL (ref 6–23)
CO2: 30 mEq/L (ref 19–32)
Calcium: 10.1 mg/dL (ref 8.4–10.5)
Creatinine, Ser: 0.9 mg/dL (ref 0.4–1.2)
Glucose, Bld: 98 mg/dL (ref 70–99)

## 2013-05-13 LAB — HEPATIC FUNCTION PANEL
Albumin: 4.4 g/dL (ref 3.5–5.2)
Total Protein: 7.8 g/dL (ref 6.0–8.3)

## 2013-05-13 MED ORDER — ROSUVASTATIN CALCIUM 5 MG PO TABS: 5.0000 mg | ORAL_TABLET | Freq: Every day | ORAL | Status: DC

## 2013-05-13 MED ORDER — BUDESONIDE-FORMOTEROL FUMARATE 80-4.5 MCG/ACT IN AERO: 2.0000 | INHALATION_SPRAY | Freq: Two times a day (BID) | RESPIRATORY_TRACT | Status: DC

## 2013-05-13 MED ORDER — CARISOPRODOL 250 MG PO TABS: 250.0000 mg | ORAL_TABLET | Freq: Three times a day (TID) | ORAL | Status: DC | PRN

## 2013-05-13 MED ORDER — HYDROCODONE-ACETAMINOPHEN 5-325 MG PO TABS: 1.0000 | ORAL_TABLET | Freq: Three times a day (TID) | ORAL | Status: DC | PRN

## 2013-05-13 NOTE — Patient Instructions (Signed)
Today we updated your med list in our EPIC system...    Today we checked your CXR & breathing test... Plus an EKG and your FASTING blood work...    We will contact you w/ the results when available...   We decided to continue the [facility]- 2sprays twice daily on a regular basis (not just as needed)...  We refilled your SOMA & VICODIN to use as needed for the neck pain/ muscle spasm and headaches...  We also refilled the CRESTOR 5mg  - this is the lowest dose medication that has a good chance of helping your Lipids...    Be sure to follow a low cholesterol, low fat diet, and get the weight down...  Call for any questions...  Let's plan a follow up visit in 29mo, sooner if needed for problems.Marland KitchenMarland Kitchen

## 2013-05-13 NOTE — Progress Notes (Signed)
Subjective:    Patient ID: Alexis Hartman, female    DOB: 1965/09/28, 47 y.o.   MRN: [id number]  HPI 47 y/o BF here for a follow up visit... she has multiple medical problems as noted below...   ~  August 12, 2011:  55mo ROV & she reports persistent GI symptoms of abd pain/ N&V (note> reflux symptoms resolved on PPI)... She went to see her gastroenterologist Alexis Hartman but is totally frustrated because all she told her was to continue the Dexilant- even when pt mentions that her abd pain, N&V continue despite Dexilant- all the doctor will tell her is to continue the Dexilant, no offer for further eval, no offer for other medication, no offer for 2nd opinion at medical center;  Note: we do not have records from [facility]... Pt requests further eval for her persistent symptoms and we will refer to [facility] GI Dept for 2nd opinion...    Fasting labs today showed LDL= 138 & pt rec to try low dose Crestor 5mg /d... Chems=wnl, CBC=wnl, Sed=15...  See prob list below>>  ~  February 13, 2012:  55mo ROV & Alexis Hartman reports that she saw Alexis Hartman et al at [facility] 4/13 for GI consultation- "they did a digestive study" she didn't have colonoscopy but she reports that all tests were neg & they agreed w/ Dexilant therapy (we do not have notes from [facility]);  Alexis Hartman says her nephew started selling Herballife products and she is delighted to report that she has lost 15# & feels great on these supplements; all prev GI symptoms have resolved & she feels much better!    She notes some mild nasal congestion & uses OTC meds as needed; on Symbicort & encouraged to use it mor regularly; Chol has been treated w/ low dose Crestor & FishOil but f/u FLP shows that she is still not at goals (reminded to take it every day); she continues to use Soma & Vicodin for pain averaging only 1-2 per month she says.    We reviewed prob list, meds, xrays and labs> see below for updates >> LABS 8/13:  FLP- not at goals w/ TChol=218 LDL 149 on Cres5...   ~   May 13, 2013:  67mo ROV & CPX> [name] returns after a 67mo hiatus w/ mult somatic complaints and "my chest is bothering me" c/o dry cough, chest pain & tenderness, HA, "my teeth hurt", "talking is a challenge"; also notes right ear discomfort, wheezing 7 chest tight esp at night, nasal congestion;  Hasn't been feeling well- tates she went to [facility] & they found a spot on her lung, told ?pneumonia, and treated w/ Doxy & Pred (she blames wt gain on the latter);  Exam is neg & CXR/ PFT/ Labs here are wnl- she is reassured... We reviewed the following medical problems during today's office visit >>     AR/ AB> on Symbicort80- 2sp Bid; she was using this just prn (she was out) & advised to use it regularly; PFT w/ borderline restriction, no obstruction; med refilled...    CHOL> prev on Cres5- she stopped on her own; FLP 11/14 on diet alone showed TChol 243, TG 68, HDL 66, LDL 165; she has refused to stay on meds; Rec to start Atorva20 & stay on it w/ f/u labs 26mo; plus diet, exercise, wt reduction...    Overweight> she weighs 211# which is up 9# in the last yr; we reviewed diet, exercise prescriptions and wt loss strategies...    GERD, Hx +HPylori> prev on Dexilant  but she stopped in favor of Herbalife product her relative was selling & states it helped, all symptoms resolved, swallowing is normal, etc...    S/p Hyst & hx bilat ovarian cysts> GYN= Alexis Hartman on [place] according to the pt they do her PAP, Mammograms, etc; we do not have records from them & nothing avail in Epic...    Hx neck pain & Headaches> she was prev eval by drLove for Neuro; on Vicodin & Soma- but she states only using 1-2 per month on average ("it helps my HAs & ear pain" she says)...    Vit D defic> Labs 11/14 showed VitD level = 29; Rec to take OTC VitD supplement ~2000u every day, plus Women's MVI & Calcium supplement...    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 We reviewed prob list, meds, xrays and labs> see below for updates >> she refuses the 2014 flu vaccine...  CXR 11/14 showed normal heart size, clear lungs, NAD.Marland KitchenMarland Kitchen EKG 11/14 showed NSR, rate68, poor tracing- ?mild NSSTTWA... PFT 11/14 showed FVC=2.81 (80%), FEV1=2.38 (85%), %1sec=85, mid-flows=110% predicted... LABS 11/14:  FLP- chol not controlled on diet alone;  Chems- wnl;  CBC- wnl;  TSH=0.97;  VitD=29...           Problem List:  ALLERGIC RHINITIS (ICD-477.9) - she uses OTC antihist and Saline Prn...  Hx of ASTHMATIC BRONCHITIS, ACUTE (ICD-466.0) - takes SYMBICORT 80 2spBid- but just using it Prn (I use it w/ activities)... doing well except for bronchitic exac 2/12 treated w/ Augmentin, Hydromet, she refuses Mucinex... ~  11/14: presents w/ mult somatic complaints and "my chest is bothering me"> c/o dry cough, chest pain & tenderness, HA, "my teeth hurt", "talking is a challenge"; also notes right ear discomfort, wheezing & chest tight esp at night, nasal congestion;  Hasn't been feeling well- states she went to [facility] & they found a spot on her lung, told ?pneumonia, and treated w/ Doxy & Pred (she blames wt gain on the latter);  Exam is neg & CXR/ PFT/ Labs here are wnl- she is reassured. ~  CXR 11/14 showed normal heart size, clear lungs, NAD... ~  PFT 11/14 showed FVC=2.81 (80%), FEV1=2.38 (85%), %1sec=85, mid-flows=110% predicted.  HYPERCHOLESTEROLEMIA (ICD-272.0) - prev on Simvastatin 40mg /d but not to goal even when taking fairly regularly; switched to Crestor 20mg /d but she stopped after 3mo stating she was INTOL due to GI issues that did not resolve off the med. ~  FLP 9/07 showed TChol 219, TG 45, HDL 53, LDL 151... ~  FLP 6/09 showed TChol 256, TG 46, HDL 62, LDL 176... rec to start Simvast40 but she never filled Rx. ~  OV 10/09 Rx written for Simvastatin 40mg Qhs...  she states she filled it but not taking  regularly. ~  FLP 4/10 showed TChol 231, TG 69, HDL 59, LDL 157... rec> take Simva40 daily. ~  FLP 11/10 showed TChol 251, TG 51, HDL 63, LDL 175... rec> incr Simva80, but she stopped on her own. ~  FLP 8/11 showed TChol 228, TG 79, HDL 57, LDL 165... rec> get back on Simva40 & take every day! ~  FLP 2/12 on Simva40 daily she says> TChol 227, TG 98, HDL 57, LDL 158... not working, ch to [employer] 20mg /d. ~  Pt reports that she was INTOL to [employer] w/ GI issues & wants to control Chol  w/ diet alone for now... ~  FLP 8/12 on diet alone showed TChol 227, TG 78, HDL 58, LDL 166... She declines meds wants to continue diet. ~  FLP 2/13 on diet alone showed TChol 209, TG 76, HDL 59, LDL 138... Rec to try CRESTOR 5mg /d... ~  FLP 8/13 ?on Cres5 showed TChol 218, TG 77, HDL 60, LDL 149... rec to take Cres5 every day + diet/exercise. ~  FLP 11/14 on diet alone showed TChol 243, TG 68, HDL 66, LDL 165... rec to start Atorva20 if she will take it every day w/ f/u FLP/ liver in 71mo...  GERD (ICD-530.81) - occas GERD symptoms in the past treated w/ OTC Zantac or Prilosec... seen by [facility] 2011 w/ EGD 9/11 showing sm HH, mild esophagitis, Rx Dexilant & improved... ~  8/12 & 2/13:  Pt ret w/ c/o persistant abd pain, N/ V, etc; we discussed restart Dexilant & f/u w/ GI, DrNat-Mann;  Labs ret w/ POS HPYLORI Antibodies & treated w/ PrevPak> sent to GI for follow up but pt very frustrated- no further w/u from [facility], pt told to just continue the Dexilant (reflux is better) but Abd pain N&V continue & pt wants further eval & requests 2nd opinion consult at [facility] GI Dept ... ~  8/13:  She reports GI eval at [facility] 4/13> we don't have notes from them- she states they did "digestive studies" & everything was neg, they rec Dexilant rx; she started taking Herbalife products w/ resolution of all her GI symptoms & now doing well... ~  11/14: she notes still doing well & not requiring meds...  Hx of HEADACHE (ICD-784.0) - hx of  headaches and neck pain evaluated by DrLove in 1999... FACIAL PAIN, ATYPICAL (ICD-350.2) - hx atyp facial pain in 2009, Rx SOMA & VICODIN, she never went to Neuro. ~  8/12:  She notes "I get a twitch every once in awhile & it lasts 2days"; Soma & Vicodin help... NECK PAIN, RIGHT (ICD-723.1) - she takes SOMA 250 Tid Prn and VICODIN Tid Prn for pain (ave 1-2 per month she says).  Hx of OTHER CONVULSIONS (ICD-780.39) - she was evaluated by DrStiefel in 1999 for some unusual spells w/ shaking of her left arm and hand, plus paresthesias of her hand and face...full eval w/ MRI and EEG were normal...she was evaluated by DrHickling in 2002 for seizure-like activity around the time of her delivery... 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 ~  OV 4/10: she notes that she still has spells ave 1-2 per month- she feels funny/ eyes twitch/ "I know when it's going to happen and I sit down", no LOC etc...  Health Maintenance -  States she is seeing [facility] [address] for her Gyn needs- she had a TAH for fibroids and a left ovarian cystectomy (benign cystic teratoma) in 2005 by DrGottsegen... sm bilat ovarian cysts seen on CT Abd by [facility] 10/11... She refuses the seasonal Flu vaccines...   Past Surgical History  Procedure Laterality Date  . 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 Eda Paschal  . Cystectomy  01/2004    Dr. Eda Paschal  . Laparoscopic cholecystectomy  05/2010    Dr. Abbey Chatters    Outpatient Encounter Prescriptions as of 05/13/2013  Medication Sig  . budesonide-formoterol (SYMBICORT) 80-4.5 MCG/ACT inhaler Inhale 2 puffs into the lungs as needed.   . carisoprodol (SOMA) 250  MG tablet Take 1.5 tablets (375 mg total) by mouth 3 (three) times daily. Up to three times a day as needed for spasm  . fish oil-omega-3 fatty acids 1000 MG capsule Take 2 g by mouth daily.    Marland Kitchen HYDROcodone-acetaminophen  (VICODIN) 2.5-500 MG per tablet Take 1 tablet by mouth 3 (three) times daily as needed.  . Multiple Vitamin (MULTIVITAMIN) tablet Take 1 tablet by mouth daily.    . NON FORMULARY Herbalife  . [DISCONTINUED] rosuvastatin (CRESTOR) 5 MG tablet Take 1 tablet (5 mg total) by mouth at bedtime.    No Known Allergies   Current Medications, Allergies, Past Medical History, Past Surgical History, Alexis History, and Social History were reviewed in [employer] record.    Review of Systems        See HPI - all other systems neg except as noted...       The patient complains of hoarseness and prolonged cough.  The patient denies anorexia, fever, weight loss, weight gain, vision loss, decreased hearing, chest pain, syncope, dyspnea on exertion, peripheral edema, headaches, hemoptysis, abdominal pain, melena, hematochezia, severe indigestion/heartburn, hematuria, incontinence, muscle weakness, suspicious skin lesions, transient blindness, difficulty walking, depression, unusual weight change, abnormal bleeding, enlarged lymph nodes, and angioedema.     Objective:   Physical Exam     WD, WN, 47 y/o BF in NAD... GENERAL:  Alert & oriented; pleasant & cooperative... HEENT:  [place]/AT, EOM-wnl, PERRLA, EACs-clear, TMs-wnl, NOSE- sl red, THROAT-clear & wnl. NECK:  Supple w/ fairROM; no JVD; normal carotid impulses w/o bruits; no thyromegaly or nodules palpated; no lymphadenopathy. CHEST:  Clear to P & A; without wheezes/ rales/ or rhonchi. HEART:  Regular Rhythm; without murmurs/ rubs/ or gallops. ABDOMEN:  Soft & essent nontender; normal bowel sounds; no organomegaly or masses detected. EXT: without deformities or arthritic changes; no varicose veins/ venous insuffic/ or edema. NEURO:  CN's intact; motor testing normal; sensory testing normal; gait normal & balance OK. DERM:  No lesions noted; no rash etc...  RADIOLOGY DATA:  Reviewed in the EPIC EMR & discussed w/ the  patient...  LABORATORY DATA:  Reviewed in the EPIC EMR & discussed w/ the patient...   Assessment & Plan:    CPX>>   GI> HH, GERD, Hx esophagitis> see above- she had GI eval DrMann, then 2nd opinion at [facility], treated for +HPylori w/ Prevpak, & advised to stay on Dexilant;  She decided to take Herbalife products & now feels much better w/ these supplements- no recurrence of symptoms...  Hx AB & AR>  She uses the Symbicort as needed but recent breathing problems indicate she should take the med more regularly...  CHOL>  Prev on low dose statin Rx w/ Crestor 5mg /d, but she wouldn't stick w/ it; FLP on diet alone is NOT at goals 7 encouraged to take Atorva20 w/ f./u labs in 31mo...  Hx HAs, Atyp Facial Pain, Neck Pain>  She has intermittent symptoms and is content to use her SOMA & VICODIN w/o additional work up, referrals, etc... She requests refills...  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  Other medical issues as noted...   Patient's Medications  New Prescriptions   HYDROCODONE-ACETAMINOPHEN (NORCO) 5-325 MG PER TABLET    Take 1 tablet by mouth 3 (three) times daily as needed for moderate pain.  ROSUVASTATIN (CRESTOR) 5 MG TABLET    Take 1 tablet (5 mg total) by mouth daily.  Previous Medications   FISH OIL-OMEGA-3 FATTY ACIDS 1000 MG CAPSULE    Take 2 g by mouth daily.     MULTIPLE VITAMIN (MULTIVITAMIN) TABLET    Take 1 tablet by mouth daily.     NON FORMULARY    Herbalife  Modified Medications   Modified Medication Previous Medication   BUDESONIDE-FORMOTEROL (SYMBICORT) 80-4.5 MCG/ACT INHALER budesonide-formoterol (SYMBICORT) 80-4.5 MCG/ACT inhaler      Inhale 2 puffs into the lungs 2 (two) times daily.    Inhale 2 puffs into the lungs as needed.    CARISOPRODOL (SOMA) 250 MG TABLET carisoprodol (SOMA) 250 MG tablet      Take 1  tablet (250 mg total) by mouth 3 (three) times daily as needed.    Take 1.5 tablets (375 mg total) by mouth 3 (three) times daily. Up to three times a day as needed for spasm  Discontinued Medications   HYDROCODONE-ACETAMINOPHEN (VICODIN) 2.5-500 MG PER TABLET    Take 1 tablet by mouth 3 (three) times daily as needed.   ROSUVASTATIN (CRESTOR) 5 MG TABLET    Take 1 tablet (5 mg total) by mouth at bedtime.

## 2013-05-14 LAB — VITAMIN D 25 HYDROXY (VIT D DEFICIENCY, FRACTURES): Vit D, 25-Hydroxy: 29 ng/mL — ABNORMAL LOW (ref 30–89)

## 2013-05-19 ENCOUNTER — Telehealth: Payer: Self-pay | Admitting: Pulmonary Disease

## 2013-05-19 DIAGNOSIS — E7800 Pure hypercholesterolemia, unspecified: Secondary | ICD-10-CM

## 2013-05-19 MED ORDER — ATORVASTATIN CALCIUM 20 MG PO TABS: 20.0000 mg | ORAL_TABLET | Freq: Every day | ORAL | Status: DC

## 2013-05-19 NOTE — Telephone Encounter (Signed)
Called and spoke with pt and she is aware of lab results per SN. Pt voiced her understanding and nothing further is needed 

## 2013-11-15 ENCOUNTER — Ambulatory Visit: Payer: 59 | Admitting: Pulmonary Disease

## 2014-03-31 ENCOUNTER — Ambulatory Visit (INDEPENDENT_AMBULATORY_CARE_PROVIDER_SITE_OTHER): Payer: 59 | Admitting: Internal Medicine

## 2014-03-31 ENCOUNTER — Telehealth: Payer: Self-pay | Admitting: Pulmonary Disease

## 2014-03-31 ENCOUNTER — Encounter (INDEPENDENT_AMBULATORY_CARE_PROVIDER_SITE_OTHER): Payer: Self-pay

## 2014-03-31 ENCOUNTER — Encounter: Payer: Self-pay | Admitting: Internal Medicine

## 2014-03-31 VITALS — BP 126/84 | HR 70 | Temp 98.5°F | Ht 70.0 in | Wt 209.8 lb

## 2014-03-31 DIAGNOSIS — J069 Acute upper respiratory infection, unspecified: Secondary | ICD-10-CM

## 2014-03-31 DIAGNOSIS — J209 Acute bronchitis, unspecified: Secondary | ICD-10-CM

## 2014-03-31 MED ORDER — METHYLPREDNISOLONE ACETATE 80 MG/ML IJ SUSP
120.0000 mg | Freq: Once | INTRAMUSCULAR | Status: AC
  Administered 2014-03-31: 120 mg via INTRAMUSCULAR

## 2014-03-31 MED ORDER — AZITHROMYCIN 250 MG PO TABS: ORAL_TABLET | ORAL | Status: DC

## 2014-03-31 NOTE — Assessment & Plan Note (Signed)
Probably viral uri at this point with little to suggest bacterial involvement so Zpak prob ok   Explained natural history of uri and why it's necessary in patients at risk to treat GERD aggressively  at least  short term   to reduce risk of evolving cyclical cough initially  triggered by epithelial injury and a heightened sensitivty to the effects of any upper airway irritants,  most importantly acid - related.  That is, the more sensitive the epithelium damaged for virus, the more the cough, the more the secondary reflux (especially in those prone to reflux) the more the irritation of the sensitive mucosa and so on in a cyclical pattern.   See instructions for specific recommendations which were reviewed directly with the patient who was given a copy with highlighter outlining the key components.

## 2014-03-31 NOTE — Progress Notes (Signed)
Subjective:    Patient ID: Alexis Hartman, female    DOB: 23-Jul-1965, 48 y.o.   MRN: [id number]  HPI 48 y/o BF here for a follow up visit... she has multiple medical problems as noted below...   ~  August 12, 2011:  54moROV & she reports persistent GI symptoms of abd pain/ N&V (note> reflux symptoms resolved on PPI)... She went to see her gastroenterologist DrNatt-Mann but is totally frustrated because all she told her was to continue the [place] even when pt mentions that her abd pain, N&V continue despite Dexilant- all the doctor will tell her is to continue the [place] no offer for further eval, no offer for other medication, no offer for 2nd opinion at medical center;  Note: [facility] Pt requests further eval for her persistent symptoms and [place]...    Fasting labs today showed LDL= 138 & pt rec to try low dose Crestor 580md... Chems=wnl, CBC=wnl, Sed=15...  See prob list below>>  ~  February 13, 2012:  25m424moV & [name] that she saw [facility]Fina [facility] al [facility]- "they did a digestive study" she didn't have colonoscopy but she reports that all tests were neg & they agreed w/ Dexilant therapy (we do not have notes from [facility] her nephew started selling Herballife products and she is delighted to report that she has lost 15# & feels great on these supplements; all prev GI symptoms have resolved & she feels much better!    She notes some mild nasal congestion & uses OTC meds as needed; on Symbicort & encouraged to use it mor regularly; Chol has been treated w/ low dose Crestor & FishOil but f/u FLP shows that she is still not at goals (reminded to take it every day); she continues to use Soma & Vicodin for pain averaging only 1-2 per month she says.    We reviewed prob list, meds, xrays and labs> see below for updates >> LABS 8/13:  FLP- not at goals w/ TChol=218 LDL 149 on Cres5...   ~   May 13, 2013:  25m125mo & CPX> AndrNereydaurns after a 25mo31mous w/ mult somatic complaints and "my chest is bothering me" c/o dry cough, chest pain & tenderness, HA, "my teeth hurt", "talking is a challenge"; also notes right ear discomfort, wheezing 7 chest tight esp at night, nasal congestion;  Hasn't been feeling well- [facility], told ?pneumonia, and treated w/ Doxy & Pred (she blames wt gain on the latter);  Exam is neg & CXR/ PFT/ Labs here are wnl- she is reassured... We reviewed the following medical problems during today's office visit >>     AR/ AB> on Symbicort80- 2sp Bid; she was using this just prn (she was out) & advised to use it regularly; PFT w/ borderline restriction, no obstruction; med refilled...    CHOL> prev on Crest- she stopped on her own; FLP 11/14 on diet alone showed TChol 243, TG 68, HDL 66, LDL 165; she has refused to stay on meds; Rec to start AtorvLahaina on it w/ f/u labs 24mo; 50mo diet, exercise, wt reduction...    Overweight> she weighs 211# which is up 9# in the last yr; we reviewed diet, exercise prescriptions and wt loss strategies...    GERD, Hx +HPylori> prev on Dexilant  but she stopped in favor of Herbalife product her relative was selling & states it helped, all symptoms resolved, swallowing is normal, etc...    S/p Hyst & hx bilat ovarian cysts> GYN= [facility] on [place] according to the pt they do her PAP, Mammograms, etc; we do not have records from them & nothing avail in [place]...    Hx neck pain & Headaches> she was prev eval by dr Erling Cruz for Neuro; on Vicodin & Soma- but she states only using 1-2 per month on average ("it helps my HAs & ear pain" she says)...    Vit D defic> Labs 11/14 showed VitD level = 29; Rec to take OTC VitD supplement ~2000u every day, plus Women's MVI & Calcium supplement...    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 We reviewed prob list, meds, xrays and labs> see below for updates >> she refuses the 2014 flu vaccine...  CXR 11/14 showed normal heart size, clear lungs, NAD.Marland KitchenMarland Kitchen EKG 11/14 showed NSR, rate68, poor tracing- ?mild NSSTTWA... PFT 11/14 showed FVC=2.81 (80%), FEV1=2.38 (85%), %1sec=85, mid-flows=110% predicted... LABS 11/14:  FLP- chol not controlled on diet alone;  Chems- wnl;  CBC- wnl;  TSH=0.97;  VitD=29..   03/31/14 phone call:   c/o head congestion, dry cough, headache, chest discomfort and right ear pain radiating down ride side of neck. Pt states she has take Benadryl, sinus and cold medication and mucinex without relief  03/31/2014 f/u ov/Alexis Hartman re:  Chief Complaint  Patient presents with  . Acute Visit    Pt c/o nasal congestion, non prod cough, and rt ear pain x 4 days.   not using symbicort except sometime with exercise but not lately R upper molars hurt but no purulent secretions or sob, cough is dry   No obvious day to day or daytime variabilty or assoc cp or chest tightness, subjective wheeze overt sinus or hb symptoms. No unusual exp hx or h/o childhood pna/ asthma or knowledge of premature birth.  Sleeping ok without nocturnal  or early am exacerbation  of respiratory  c/o's or need for noct saba. Also denies any obvious fluctuation of symptoms with weather or environmental changes or other aggravating or alleviating factors except as outlined above   Current Medications, Allergies, Complete Past Medical History, Past Surgical History, Alexis History, and Social History were reviewed in [employer] record.  ROS  The following are not active complaints unless bolded sore throat, dysphagia, dental problems, itching, sneezing,  nasal congestion or excess/ purulent secretions, ear ache,   fever, chills, sweats, unintended wt loss, pleuritic or exertional cp, hemoptysis,  orthopnea pnd or leg  swelling, presyncope, palpitations, heartburn, abdominal pain, anorexia, nausea, vomiting, diarrhea  or change in bowel or urinary habits, change in stools or urine, dysuria,hematuria,  rash, arthralgias, visual complaints, headache, numbness weakness or ataxia or problems with walking or coordination,  change in mood/affect or memory.                 Problem List:  ALLERGIC RHINITIS (ICD-477.9) - she uses OTC antihist and Saline Prn...  Hx of ASTHMATIC BRONCHITIS, ACUTE (ICD-466.0) - takes SYMBICORT 80 2spBid- but just using it Prn (I use it w/ activities)... doing well except for bronchitic exac 2/12 treated w/ Augmentin, Hydromet, she refuses Mucinex... ~  11/14: presents w/ mult somatic complaints and "my chest  is bothering me"> c/o dry cough, chest pain & tenderness, HA, "my teeth hurt", "talking is a challenge"; also notes right ear discomfort, wheezing & chest tight esp at night, nasal congestion;  Hasn't been feeling well- states she went to [facility] & they found a spot on her lung, told ?pneumonia, and treated w/ Doxy & Pred (she blames wt gain on the latter);  Exam is neg & CXR/ PFT/ Labs here are wnl- she is reassured. ~  CXR 11/14 showed normal heart size, clear lungs, NAD... ~  PFT 11/14 showed FVC=2.81 (80%), FEV1=2.38 (85%), %1sec=85, mid-flows=110% predicted.  HYPERCHOLESTEROLEMIA (ICD-272.0) - prev on Simvastatin 773m/d but not to goal even when taking fairly regularly; switched to Crestor 23md but she stopped after 73m50moating she was INTOL due to GI issues that did not resolve off the med. ~  FLPCl[place]07 showed TChol 219, TG 45, HDL 53, LDL 151... ~  FLPOklee09 showed TChol 256, TG 46, HDL 62, LDL 176... rec to start Simvast40 but she never filled Rx. ~  OV 10/09 Rx written for Simvastatin 17m17m...  she states she filled it but not taking regularly. ~  FLP 4/10 showed TChol 231, TG 69, HDL 59, LDL 157... rec> take Simva40 daily. ~  FLP [place]10 showed TChol 251, TG 51, HDL 63, LDL  175... rec> incr Simva80, but she stopped on her own. ~  FLP [place] showed TChol 228, TG 79, HDL 57, LDL 165... rec> get back on Simva40 & take every day! ~  FLP [place]2 on Simva40 daily she says> TChol 227, TG 98, HDL 57, LDL 158... not working, ch to [employer]29m~  Pt reports that she was INTOL to [employer] issues & wants to control Chol w/ diet alone for now... ~  FLP [place] on diet alone showed TChol 227, TG 78, HDL 58, LDL 166... She declines meds wants to continue diet. ~  FLP 2/13 on diet alone showed TChol 209, TG 76, HDL 59, LDL 138... Rec to try CRESTOR 5mg/d55m ~  FLP 8/Bonnie?on Cres5 showed TChol 218, TG 77, HDL 60, LDL 149... rec to take Cres5 every day + diet/exercise. ~  FLP 11/14 on diet alone showed TChol 243, TG 68, HDL 66, LDL 165... 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 [facility]/11 showing sm HH, mild esophagitis, Rx Dexilant & improved... ~  8/12 & 2/13:  Pt ret w/ c/o persistant abd pain, N/ V, etc; we discussed restart Dexilant & f/u w/ GI, DrNat-Mann;  Labs ret w/ POS HPYLORI Antibodies & treated w/ PrevPak> sent to GI for follow up but pt very frustrated- no further [facility]Mann[facility] to just continue the Dexilant (reflux is better) but Abd pain N&V continue & pt wants further eval & requests [facility] 8[facility]:  [facility]- she states they did "digestive studies" & everything was neg, they rec Dexilant rx; she started taking Herbalife products w/ resolution of all her GI symptoms & now doing well... ~  11/14: she notes still doing well & not requiring meds...  Hx of HEADACHE (ICD-784.0) - hx of headaches and neck pain evaluated by DrLove in 1999... FACIAL PAIN, ATYPICAL (ICD-350.2) - hx  atyp facial pain in 2009, Rx SOMA & VICODIN, she  never went to Neuro. ~  8/12:  She notes "I get a twitch every once in awhile & it lasts 2days"; Soma & Vicodin help... NECK PAIN, RIGHT (ICD-723.1) - she takes SOMA 250 Tid Prn and VICODIN Tid Prn for pain (ave 1-2 per month she says).  Hx of OTHER CONVULSIONS (ICD-780.39) - she was evaluated by DrStiefel in 1999 for some unusual spells w/ shaking of her left arm and hand, plus paresthesias of her hand and face...full eval w/ MRI and EEG were normal...she was evaluated by DrHickling in 2002 for seizure-like activity around the time of her delivery... work up was neg and she was felt to have a non-epileptiform seizure (she was [facility])... ~  OV 4/10: she notes that she still has spells ave 1-2 per month- she feels funny/ eyes twitch/ "I know when it's going to happen and I sit down", no LOC etc...  Health Maintenance -  States she is [facility] [address] for her Gyn needs- she had a TAH for fibroids and a left ovarian cystectomy (benign cystic teratoma) in 2005 by DrGottsegen... sm bilat ovarian cysts seen on CT Abd [facility]... She refuses the seasonal Flu vaccines...   Past Surgical History  Procedure Laterality Date  . 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 Cherylann Banas  . Cystectomy  01/2004    Dr. Cherylann Banas  . Laparoscopic cholecystectomy  05/2010    Dr. Zella Richer        Objective:   Physical Exam     WD, WN, amb bf  in NAD/ not acutely ill appearing at all   Wt Readings from Last 3 Encounters:  03/31/14 209 lb 12.8 oz (95.165 kg)  05/13/13 211 lb 9.6 oz (95.981 kg)  02/13/12 201 lb 3.2 oz (91.264 kg)      GENERAL:  Alert & oriented; pleasant & cooperative  HEENT: nl dentition, turbinates, and orophanx. Nl external ear canals without cough reflex   NECK :  without JVD/Nodes/TM/ nl carotid upstrokes bilaterally   LUNGS: no acc muscle use, clear to A and P bilaterally without cough on insp or exp  maneuvers   CV:  RRR  no s3 or murmur or increase in P2, no edema   ABD:  soft and nontender with nl excursion in the supine position. No bruits or organomegaly, bowel sounds nl  MS:  warm without deformities, calf tenderness, cyanosis or clubbing  SKIN: warm and dry without lesions    NEURO:  alert, approp, no deficits   RADIOLOGY DATA:  Reviewed in the EPIC EMR & discussed w/ the patient...  LABORATORY DATA:  Reviewed in the EPIC EMR & discussed w/ the patient...   Assessment & Plan:      .          Marland KitchenMarland Kitchen

## 2014-03-31 NOTE — Telephone Encounter (Signed)
Called and spoke to pt. Pt c/o head congestion, dry cough, headache, chest discomfort and right ear pain radiating down ride side of neck. Pt states she has take Benadryl, sinus and cold medication and mucinex without relief. Appt made with MW at 430p d/t SN leaving office early today.

## 2014-03-31 NOTE — Patient Instructions (Addendum)
advil cold and sinus or mucinex D  As per bottle (avoid antihistamines)   Zpak   When coughing, throat clearing > stop fish oil, mint and menthol and use sugarless candy ice cubes  Follow up with Dr Kriste BasqueNadel next week if not a lot better

## 2014-08-22 ENCOUNTER — Telehealth: Payer: Self-pay | Admitting: Pulmonary Disease

## 2014-08-22 NOTE — Telephone Encounter (Signed)
Spoke with the pt  Appt with SN for cpx fasting 08/25/14 at 9 am  Nothing further needed

## 2014-08-22 NOTE — Telephone Encounter (Signed)
Spoke with the pt. Advised her that we would have to check with SN to see if we can schedule this. Also made her aware that she will more than likely have to pay her specialty copay when she is seen.  SN - please advise if we can schedule her physical.

## 2014-08-22 NOTE — Telephone Encounter (Signed)
Per SN---  Ok to schedule pt for a cpx.  thanks

## 2014-08-22 NOTE — Telephone Encounter (Signed)
lmtcb x1 

## 2014-08-25 ENCOUNTER — Other Ambulatory Visit (INDEPENDENT_AMBULATORY_CARE_PROVIDER_SITE_OTHER): Payer: 59

## 2014-08-25 ENCOUNTER — Encounter: Payer: Self-pay | Admitting: Pulmonary Disease

## 2014-08-25 ENCOUNTER — Ambulatory Visit (INDEPENDENT_AMBULATORY_CARE_PROVIDER_SITE_OTHER): Payer: 59 | Admitting: Pulmonary Disease

## 2014-08-25 ENCOUNTER — Ambulatory Visit (INDEPENDENT_AMBULATORY_CARE_PROVIDER_SITE_OTHER)
Admission: RE | Admit: 2014-08-25 | Discharge: 2014-08-25 | Disposition: A | Discharge status: Home / Self Care | Payer: 59 | Source: Ambulatory Visit | Attending: Pulmonary Disease | Admitting: Pulmonary Disease

## 2014-08-25 VITALS — BP 132/84 | HR 57 | Temp 98.8°F | Ht 70.0 in | Wt 201.1 lb

## 2014-08-25 DIAGNOSIS — Z0000 Encounter for general adult medical examination without abnormal findings: Secondary | ICD-10-CM

## 2014-08-25 DIAGNOSIS — E7800 Pure hypercholesterolemia, unspecified: Secondary | ICD-10-CM

## 2014-08-25 DIAGNOSIS — J209 Acute bronchitis, unspecified: Secondary | ICD-10-CM

## 2014-08-25 DIAGNOSIS — IMO0002 Reserved for concepts with insufficient information to code with codable children: Secondary | ICD-10-CM

## 2014-08-25 DIAGNOSIS — M6749 Ganglion, multiple sites: Secondary | ICD-10-CM

## 2014-08-25 DIAGNOSIS — K219 Gastro-esophageal reflux disease without esophagitis: Secondary | ICD-10-CM

## 2014-08-25 LAB — CBC WITH DIFFERENTIAL/PLATELET
BASOS ABS: 0.1 10*3/uL (ref 0.0–0.1)
Basophils Relative: 1 % (ref 0.0–3.0)
Eosinophils Absolute: 0.1 10*3/uL (ref 0.0–0.7)
Eosinophils Relative: 1.3 % (ref 0.0–5.0)
HCT: 38.8 % (ref 36.0–46.0)
HEMOGLOBIN: 13.1 g/dL (ref 12.0–15.0)
LYMPHS PCT: 50.9 % — AB (ref 12.0–46.0)
Lymphs Abs: 3.8 10*3/uL (ref 0.7–4.0)
MCHC: 33.9 g/dL (ref 30.0–36.0)
MCV: 85.6 fl (ref 78.0–100.0)
MONOS PCT: 5.6 % (ref 3.0–12.0)
Monocytes Absolute: 0.4 10*3/uL (ref 0.1–1.0)
Neutro Abs: 3 10*3/uL (ref 1.4–7.7)
Neutrophils Relative %: 41.2 % — ABNORMAL LOW (ref 43.0–77.0)
Platelets: 237 10*3/uL (ref 150.0–400.0)
RBC: 4.53 Mil/uL (ref 3.87–5.11)
RDW: 13.7 % (ref 11.5–15.5)
WBC: 7.4 10*3/uL (ref 4.0–10.5)

## 2014-08-25 LAB — BASIC METABOLIC PANEL
BUN: 12 mg/dL (ref 6–23)
CHLORIDE: 106 meq/L (ref 96–112)
CO2: 29 mEq/L (ref 19–32)
Calcium: 9.8 mg/dL (ref 8.4–10.5)
Creatinine, Ser: 1 mg/dL (ref 0.40–1.20)
GFR: 75.83 mL/min (ref >60.00)
GLUCOSE: 97 mg/dL (ref 70–99)
POTASSIUM: 4.3 meq/L (ref 3.5–5.1)
SODIUM: 139 meq/L (ref 135–145)

## 2014-08-25 LAB — HEPATIC FUNCTION PANEL
ALK PHOS: 41 U/L (ref 39–117)
ALT: 17 U/L (ref 0–35)
AST: 16 U/L (ref 0–37)
Albumin: 4.4 g/dL (ref 3.5–5.2)
Bilirubin, Direct: 0.1 mg/dL (ref 0.0–0.3)
TOTAL PROTEIN: 7.5 g/dL (ref 6.0–8.3)
Total Bilirubin: 0.5 mg/dL (ref 0.2–1.2)

## 2014-08-25 LAB — LIPID PANEL
CHOL/HDL RATIO: 4
CHOLESTEROL: 216 mg/dL — AB (ref 0–200)
HDL: 57.1 mg/dL (ref >39.00)
LDL CALC: 141 mg/dL — AB (ref 0–99)
NonHDL: 158.9
Triglycerides: 91 mg/dL (ref 0.0–149.0)
VLDL: 18.2 mg/dL (ref 0.0–40.0)

## 2014-08-25 LAB — TSH: TSH: 0.76 u[IU]/mL (ref 0.35–4.50)

## 2014-08-25 LAB — VITAMIN D 25 HYDROXY (VIT D DEFICIENCY, FRACTURES): VITD: 18.59 ng/mL — AB (ref 30.00–100.00)

## 2014-08-25 MED ORDER — LEVOFLOXACIN 500 MG PO TABS: 500.0000 mg | ORAL_TABLET | Freq: Every day | ORAL | Status: DC

## 2014-08-25 NOTE — Patient Instructions (Signed)
Today we updated your med list in our EPIC system...    Continue your current medications the same...   For your Bronchitis>>  We wrote a new prescription for LEVAQUIN- 500mg  one tab daily til gone...    Be sure to take an OTC ALIGN daily while on the antibiotic...  Drink plenty of fluids and take OTC MUCINEX 600mg - one tab 4 times daily     And use OTC DELSYM cough syrup as needed- 2 tsp twice daily...  We will [facility] of the cyst on your left wrist...  Today we did your follow up CXR & FASTING blood work...    We will contact you w/ the results when available...   Call for any questions...  Let's plan a follow up visit in [id number], sooner if needed for problems.Marland Kitchen..Marland Kitchen

## 2014-08-25 NOTE — Progress Notes (Signed)
Subjective:    Patient ID: Alexis Hartman, female    DOB: 11-12-1965, 49 y.o.   MRN: [id number]  HPI 49 y/o BF here for a follow up visit... she has multiple medical problems as noted below...  ~  SEE PREV EPIC NOTES FOR OLDER DATA >>   ~  February 13, 2012:  29moROV & Alexis Hartman reports [facility]- "they did a digestive study" she didn't have colonoscopy but she reports that all tests were neg & they agreed w/ Dexilant therapy ([facility];  Alexis Hartman her nephew started selling Herballife products and she is delighted to report that she has lost 15# & feels great on these supplements; all prev GI symptoms have resolved & she feels much better!    She notes some mild nasal congestion & uses OTC meds as needed; on Symbicort & encouraged to use it mor regularly; Chol has been treated w/ low dose Crestor & FishOil but f/u FLP shows that she is still not at goals (reminded to take it every day); she continues to use Soma & Vicodin for pain averaging only 1-2 per month she says.    We reviewed prob list, meds, xrays and labs> see below for updates >>  LABS 8/13:  FLP- not at goals w/ TChol=218 LDL 149 on Cres5...   ~  May 13, 2013:  187moOV & CPX> Alexis Hartman after a 1560moatus w/ mult somatic complaints and "my chest is bothering me" c/o dry cough, chest pain & tenderness, HA, "my teeth hurt", "talking is a challenge"; also notes right ear discomfort, wheezing 7 chest tight esp at night, nasal congestion;  Hasn't been feeling well- [facility] told ?pneumonia, and treated w/ Doxy & Pred (she blames wt gain on the latter);  Exam is neg & CXR/ PFT/ Labs here are wnl- she is reassured... We reviewed the following medical problems during today's office visit >>     AR/ AB> on Symbicort80- 2sp Bid; she was using this just prn (she was out) & advised to use it regularly; PFT w/ borderline  restriction, no obstruction; med refilled...    CHOL> prev on Cres5- she stopped on her own; FLP 11/14 on diet alone showed TChol 243, TG 68, HDL 66, LDL 165; she has refused to stay on meds; Rec to start [place] it w/ f/u labs 107mo41mous diet, exercise, wt reduction...    Overweight> she weighs 211# which is up 9# in the last yr; we reviewed diet, exercise prescriptions and wt loss strategies...    GERD, Hx +HPylori> prev on Dexilant but she stopped in favor of Herbalife product her relative was selling & states it helped, all symptoms resolved, swallowing is normal, etc...    S/p Hyst & hx bilat ovarian cysts> [facility] [place] [facility] they do her PAP, Mammograms, etc; we do not have records from them & [facility]   Hx neck pain & Headaches> she was prev eval by Alexis Hartman for Hartman; on Vicodin & Soma- but she states only using 1-2 per month on average ("it helps my HAs & ear pain" she says)...    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 We reviewed prob list, meds, xrays and labs> see below for updates >> she refuses the 2014 flu vaccine...   CXR 11/14 showed normal heart size, clear lungs, NAD.Marland KitchenMarland Kitchen  EKG 11/14 showed NSR, rate68, poor tracing- ?mild NSSTTWA...  PFT 11/14 showed FVC=2.81 (80%), FEV1=2.38 (85%), %1sec=85, mid-flows=110% predicted...  LABS 11/14:  FLP- chol not controlled on diet alone;  Chems- wnl;  CBC- wnl;  TSH=0.97;  VitD=29...  ~  August 25, 2014:  72moROV & CPX> AEulahreports that she has married her childhood sweetheart, NRori Hartman she has been generally health over the past yr but notes recent URI w/ symptoms over the last 3-4wks including sore throat,  paroxysmal cough w/ thick yellow-green sput, head & chest congestion w/ HA, and incr DOE but no fever/ CP or hemoptysis; she has tried OTC meds including Tylenol, Mucinex, etc; Note that she stopped her prev Symbicort (none for ~176yrand she has not had the 2015 Flu vaccine... Exam is essentially neg- VSS, clear chest w/o w/r/r, and she is npon-toxic appearing... CXR (clear, NAD) & Labs (all ok) are below... We decided to cover her w/ Levaquin & the OTC meds...  We reviewed the following medical problems during today's office visit >>     AR/ AB> off prev Symbicort; PFT 2014 w/ borderline restriction, no obstruction; she gets occas URIs and rec to get the yearly flu vaccine & avoid infections...    CHOL> off prev Cres5, states Atorva caused joint pains; FLP 2/16 on diet alone showed TChol 216, TG 91, HDL 57, LDL 141 and she prefers diet/ exercise, declines meds...    Overweight> she weighs 201# which is down 10# in the last yr; we reviewed diet, exercise prescriptions and wt loss strategies...    GERD, Hx +HPylori> prev on Dexilant but she stopped in favor of Herbalife product her relative was selling & states it helped, all symptoms resolved, swallowing is normal, etc...    S/p Hyst & hx bilat ovarian cysts> [facility] [place] [facility] they do her PAP, Mammograms, etc; we do not have records from them & nothing avail in Epic; she will call for f/u visit...    Hx neck pain & Headaches> she was prev eval by Alexis [place] Hartman; on Vicodin & Soma- but she states only using 1-2 per month on average ("it helps my HAs & ear pain" she says)...    Vit D defic> on OTC VitD supplement ~2000u every day, plus Women's MVI & Calcium supplement; Labs 2/16 showed Vit D level = 19 and asked to double the VitD supplement to ~4000u daily...    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 We reviewed prob list, meds, xrays and labs> see below for updates >>   CXR 2/16 showed norm heart size, clear lungs w/ min basilar scarring, NAD...  LABS 2/16:  FLP- not at goals on diet alone w/ LDL=141;  Chems- wnl;  CBC- wnl;  TSH=0.76;  VitD=19 PLAN>> For her URI/ bronchitis we will Rx w/ Levaquin, Mucinex, fluids, Delsym, etc;  Chol is still too hi w/ LDL=141 but she declines meds and asked to follow low chol/ low fat  diet & get wt down;  Finally her VitD level is lower despite OTC supplement of ~2000u daily (?compliance) and asked to double this supplement to ~4000u daily;  She will contact GYN for needed f/u visit & she sees DrNat-Mann for GI...           Problem List:  ALLERGIC RHINITIS (ICD-477.9) - she uses OTC antihist and Saline Prn...  Hx of ASTHMATIC BRONCHITIS, ACUTE (ICD-466.0) - takes SYMBICORT 80 2spBid- but just using it Prn (I use it w/ activities)... doing well except for bronchitic exac 2/12 treated w/ Augmentin, Hydromet, she refuses Mucinex... ~  11/14: presents w/ mult somatic complaints and "my chest is bothering me"> c/o dry cough, chest pain & tenderness, HA, "my teeth hurt", "talking is a challenge"; also notes right ear discomfort, wheezing & chest tight esp at night, nasal congestion;  Hasn't been feeling well- states she went [facility] & they found a spot on her lung, told ?pneumonia, and treated w/ Doxy & Pred (she blames wt gain on the latter);  Exam is neg & CXR/ PFT/ Labs here are wnl- she is reassured. ~  CXR 11/14 showed normal heart size, clear lungs, NAD... ~  PFT 11/14 showed FVC=2.81 (80%), FEV1=2.38 (85%), %1sec=85, mid-flows=110% predicted. ~  2/16: here for CPX & mentioned URI, bronchitic infection- treated w/ Levaquin, Mucinex/ fluids, Delsym, etc... ~  CXR 2/16 showed norm heart size, clear lungs w/ min basilar scarring, NAD...  HYPERCHOLESTEROLEMIA (ICD-272.0) - prev on Simvastatin 68m/d but not to goal even when taking fairly  regularly; switched to Crestor 215md but she stopped after 5m58moating she was INTOL due to GI issues that did not resolve off the med. ~  FLP[place]07 showed TChol 219, TG 45, HDL 53, LDL 151... ~  FLP[place]09 showed TChol 256, TG 46, HDL 62, LDL 176... rec to start Simvast40 but she never filled Rx. ~  OV 10/09 Rx written for Simvastatin 46m65m...  she states she filled it but not taking regularly. ~  FLP 4/10 showed TChol 231, TG 69, HDL 59, LDL 157... rec> take Simva40 daily. ~  FLP [place]10 showed TChol 251, TG 51, HDL 63, LDL 175... rec> incr Simva80, but she stopped on her own. ~  FLP [place]1 showed TChol 228, TG 79, HDL 57, LDL 165... rec> get back on Simva40 & take every day! ~  FLP [place]2 on Simva40 daily she says> TChol 227, TG 98, HDL 57, LDL 158... not working, ch to [employer]g78m~  Pt reports that she was INTOL to [employer]rest[employer] CorporationI issues & wants to control Chol w/ diet alone for now... ~  FLP [place] on diet alone showed TChol 227, TG 78, HDL 58, LDL 166... She declines meds wants to continue diet. ~  FLP 2/13 on diet alone showed TChol 209, TG 76, HDL 59, LDL 138... Rec to try CRESTOR 5mg/d25m ~  FLP 8/[place]?on Cres5 showed TChol 218, TG 77, HDL 60, LDL 149... rec to take Cres5 every day + diet/exercise. ~  FLP 11/14 on diet alone showed TChol 243, TG 68, HDL 66, LDL 165... rec to start Atorva20 if she will take it every day w/ f/u FLP/ liver in 14mo (s46mos INTOL w/ joint pain & stopped on her own)... ~  FLP 2/[place] diet alone showed TChol 216, TG 91, HDL 57, LDL 141 and she prefers diet/ exercise, declines meds  GERD (ICD-530.81) - occas GERD symptoms in the past treated w/ OTC Zantac or  Prilosec... [facility] by [facility] 2011 w/ EGD 9/11 showing sm HH, mild esophagitis, Rx Dexilant & improved... ~  8/12 & 2/13:  Pt ret w/ c/o persistant abd pain, N/ V, etc; we discussed restart Dexilant & f/u w/ GI, DrNat-Mann;  Labs ret w/ POS HPYLORI Antibodies & treated w/ PrevPak> sent to GI for follow up but pt very  frustrated- no further w/u from [facility], pt told to just continue the Dexilant (reflux is better) but Abd pain N&V continue & pt wants further eval & requests 2nd opinion consult at [place] ... ~  8/13:  She reports GI eval at [facility] 4/[facility]> we don't have notes from them- she states they did "digestive studies" & everything was neg, they rec Dexilant rx; she started taking Herbalife products w/ resolution of all her GI symptoms & now doing well... ~  11/14: she notes still doing well & not requiring meds...  Hx of HEADACHE (ICD-784.0) - hx of headaches and neck pain evaluated by Alexis Hartman in 1999... FACIAL PAIN, ATYPICAL (ICD-350.2) - hx atyp facial pain in 2009, Rx SOMA & VICODIN, she never went to Hartman. ~  8/12:  She notes "I get a twitch every once in awhile & it lasts 2days"; Soma & Vicodin help... NECK PAIN, RIGHT (ICD-723.1) - she takes SOMA 250 Tid Prn and VICODIN Tid Prn for pain (ave 1-2 per month she says).  Hx of OTHER CONVULSIONS (ICD-780.39) - she was evaluated by DrStiefel in 1999 for some unusual spells w/ shaking of her left arm and hand, plus paresthesias of her hand and face...full eval w/ MRI and EEG were normal...she was evaluated by DrHickling in 2002 for seizure-like activity around the time of her delivery... work up was neg and she was felt to have a non-epileptiform seizure (she was seen at the [facility] epilepsy monitoring unit)... ~  OV 4/10: she notes that she still has spells ave 1-2 per month- she feels funny/ eyes twitch/ "I know when it's going to happen and I sit down", no LOC etc...  Health Maintenance -  States she is [facility] [address] for her Gyn needs- she had a TAH for fibroids and a left ovarian cystectomy (benign cystic teratoma) in 2005 by DrGottsegen... sm bilat ovarian cysts seen on CT Abd by [facility] 10/11... She refuses the seasonal Flu vaccines...   Past Surgical History  Procedure Laterality Date  . Vesicovaginal fistula closure w/ tah  01/2004     Dr. Cherylann Banas  . Cystectomy  01/2004    Dr. Cherylann Banas  . Laparoscopic cholecystectomy  05/2010    Dr. Zella Richer    Outpatient Encounter Prescriptions as of 08/25/2014  Medication Sig  . budesonide-formoterol (SYMBICORT) 80-4.5 MCG/ACT inhaler Inhale 2 puffs into the lungs 2 (two) times daily as needed.  . carisoprodol (SOMA) 250 MG tablet Take 1 tablet (250 mg total) by mouth 3 (three) times daily as needed.  . Cholecalciferol (VITAMIN D) 2000 UNITS CAPS Take 1 capsule by mouth daily.  Marland Kitchen HYDROcodone-acetaminophen (NORCO) 5-325 MG per tablet Take 1 tablet by mouth 3 (three) times daily as needed for moderate pain.  . Multiple Vitamin (MULTIVITAMIN) tablet Take 1 tablet by mouth daily.    . [DISCONTINUED] azithromycin (ZITHROMAX) 250 MG tablet Take 2 on day one then 1 daily x 4 days (Patient not taking: Reported on 08/25/2014)  . [DISCONTINUED] fish oil-omega-3 fatty acids 1000 MG capsule Take 2 g by mouth daily.    . [DISCONTINUED] NON FORMULARY Herbalife  No Known Allergies   Current Medications, Allergies, Past Medical History, Past Surgical History, Family History, and Social History were reviewed in [employer] record.    Review of Systems        See HPI - all other systems neg except as noted...       The patient complains of hoarseness and prolonged cough.  The patient denies anorexia, fever, weight loss, weight gain, vision loss, decreased hearing, chest pain, syncope, dyspnea on exertion, peripheral edema, headaches, hemoptysis, abdominal pain, melena, hematochezia, severe indigestion/heartburn, hematuria, incontinence, muscle weakness, suspicious skin lesions, transient blindness, difficulty walking, depression, unusual weight change, abnormal bleeding, enlarged lymph nodes, and angioedema.     Objective:   Physical Exam     WD, WN, 49 y/o BF in NAD... GENERAL:  Alert & oriented; pleasant & cooperative... HEENT:  [place]/AT, EOM-wnl, PERRLA, EACs-clear,  TMs-wnl, NOSE- sl red, THROAT-clear & wnl. NECK:  Supple w/ fairROM; no JVD; normal carotid impulses w/o bruits; no thyromegaly or nodules palpated; no lymphadenopathy. CHEST:  Clear to P & A; without wheezes/ rales/ or rhonchi. HEART:  Regular Rhythm; without murmurs/ rubs/ or gallops. ABDOMEN:  Soft & essent nontender; normal bowel sounds; no organomegaly or masses detected. EXT: without deformities or arthritic changes; no varicose veins/ venous insuffic/ or edema. Hartman:  CN's intact; motor testing normal; sensory testing normal; gait normal & balance OK. DERM:  No lesions noted; no rash etc...  RADIOLOGY DATA:  Reviewed in the EPIC EMR & discussed w/ the patient...  LABORATORY DATA:  Reviewed in the EPIC EMR & discussed w/ the patient...   Assessment & Plan:    CPX>>   Hx AB & AR>  She uses the Symbicort as needed but recent breathing problems indicate she should take the med more regularly...  CHOL>  Prev on low dose statin Rx w/ Crestor 83m/d, but she wouldn't stick w/ it; FLP on diet alone is NOT at goals 7 encouraged to take Atorva20 w/ f./u labs in 342mo.  GI> HH, GERD, Hx esophagitis> see above- she had prev GI eval DrMann, [facility] +[facility]/ Prevpak, & advised to stay on [place] She decided to take Herbalife products & now feels much better w/ these supplements- no recurrence of symptoms...  Hx HAs, Atyp Facial Pain, Neck Pain>  She has intermittent symptoms and is content to use her SOMA & VICODIN w/o additional work up, referrals, etc... She requests refills...  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  Other medical issues as noted...   Patient's Medications  New Prescriptions   LEVOFLOXACIN (LEVAQUIN) 500 MG TABLET    Take 1 tablet (500 mg total) by mouth daily.  Previous Medications    CARISOPRODOL (SOMA) 250 MG TABLET    Take 1 tablet (250 mg total) by mouth 3 (three) times daily as needed.   CHOLECALCIFEROL (VITAMIN D) 2000 UNITS CAPS    Take 1 capsule by mouth daily.   HYDROCODONE-ACETAMINOPHEN (NORCO) 5-325 MG PER TABLET    Take 1 tablet by mouth 3 (three) times daily as needed for moderate pain.   MULTIPLE VITAMIN (MULTIVITAMIN) TABLET    Take 1 tablet by mouth daily.    Modified Medications   No medications on file  Discontinued Medications   AZITHROMYCIN (ZITHROMAX) 250 MG TABLET  Take 2 on day one then 1 daily x 4 days   BUDESONIDE-FORMOTEROL (SYMBICORT) 80-4.5 MCG/ACT INHALER    Inhale 2 puffs into the lungs 2 (two) times daily as needed.   FISH OIL-OMEGA-3 FATTY ACIDS 1000 MG CAPSULE    Take 2 g by mouth daily.     NON FORMULARY    Herbalife

## 2014-08-28 ENCOUNTER — Telehealth: Payer: Self-pay | Admitting: Pulmonary Disease

## 2014-08-28 MED ORDER — HYDROCODONE-ACETAMINOPHEN 5-325 MG PO TABS: 1.0000 | ORAL_TABLET | Freq: Three times a day (TID) | ORAL | Status: DC | PRN

## 2014-08-28 MED ORDER — CARISOPRODOL 250 MG PO TABS: 250.0000 mg | ORAL_TABLET | Freq: Three times a day (TID) | ORAL | Status: DC | PRN

## 2014-08-28 NOTE — Telephone Encounter (Signed)
Received refill request through MyChart for Soma 250mg  and Hydrocodone 5/325.  Last seen 08/25/14 Tresa GarterSoma was last filled 05/13/13 #90 Hydrocodone was last filled 05/13/13 #90  SN - please advise on refill.

## 2014-08-28 NOTE — Telephone Encounter (Signed)
Spoke with pt, she will be picking these medications up from our office.    This has been placed up front.  Nothing further needed.

## 2014-08-28 NOTE — Telephone Encounter (Signed)
Pt returning call - [phone]

## 2014-08-28 NOTE — Telephone Encounter (Signed)
Per SN: ok to refill after checking with pharm to see if another MD is filling.  [facility]  Was advised last rx for Tresa GarterSoma and Awanda Minkorco is from May 13, 2013 by Dr. Kriste BasqueNadel.   Rxs printed and placed on SN's cart for signature.  lmomtcb for pt to see if she would like these mailed or would like to pick up rxs.

## 2014-08-28 NOTE — Telephone Encounter (Signed)
Per SN: ok to refill after checking with pharm to see if another MD is filling.  [facility]a [facility]. Was advised last rxs are from 05/13/2013 from Dr. Kriste BasqueNadel.  Rxs printed and placed on SN's cart for signature.  lmomtcb for pt to see if she would like to pick these up

## 2014-09-05 NOTE — Telephone Encounter (Signed)
Called and lmomtcb to see if this pt got her rx.

## 2014-09-12 NOTE — Telephone Encounter (Signed)
Have attempted to call pt and lmom for several days.  Will sign off message at this time.

## 2015-06-19 ENCOUNTER — Encounter: Payer: Self-pay | Admitting: Pulmonary Disease

## 2016-07-15 DIAGNOSIS — Z0000 Encounter for general adult medical examination without abnormal findings: Secondary | ICD-10-CM | POA: Diagnosis not present

## 2016-10-13 DIAGNOSIS — S39012A Strain of muscle, fascia and tendon of lower back, initial encounter: Secondary | ICD-10-CM | POA: Diagnosis not present

## 2016-11-13 DIAGNOSIS — M545 Low back pain: Secondary | ICD-10-CM | POA: Diagnosis not present

## 2016-11-13 DIAGNOSIS — M5442 Lumbago with sciatica, left side: Secondary | ICD-10-CM | POA: Diagnosis not present

## 2016-11-28 ENCOUNTER — Encounter: Payer: Self-pay | Admitting: Internal Medicine

## 2016-11-28 ENCOUNTER — Other Ambulatory Visit (INDEPENDENT_AMBULATORY_CARE_PROVIDER_SITE_OTHER): Payer: 59

## 2016-11-28 ENCOUNTER — Ambulatory Visit (INDEPENDENT_AMBULATORY_CARE_PROVIDER_SITE_OTHER): Payer: 59 | Admitting: Internal Medicine

## 2016-11-28 VITALS — BP 140/96 | HR 71 | Temp 98.1°F | Resp 16 | Ht 70.0 in | Wt 197.0 lb

## 2016-11-28 DIAGNOSIS — Z833 Family history of diabetes mellitus: Secondary | ICD-10-CM | POA: Insufficient documentation

## 2016-11-28 DIAGNOSIS — J4520 Mild intermittent asthma, uncomplicated: Secondary | ICD-10-CM | POA: Diagnosis not present

## 2016-11-28 DIAGNOSIS — Z1211 Encounter for screening for malignant neoplasm of colon: Secondary | ICD-10-CM | POA: Diagnosis not present

## 2016-11-28 DIAGNOSIS — Z0000 Encounter for general adult medical examination without abnormal findings: Secondary | ICD-10-CM

## 2016-11-28 DIAGNOSIS — E7800 Pure hypercholesterolemia, unspecified: Secondary | ICD-10-CM | POA: Diagnosis not present

## 2016-11-28 DIAGNOSIS — F445 Conversion disorder with seizures or convulsions: Secondary | ICD-10-CM | POA: Insufficient documentation

## 2016-11-28 DIAGNOSIS — J45909 Unspecified asthma, uncomplicated: Secondary | ICD-10-CM | POA: Insufficient documentation

## 2016-11-28 DIAGNOSIS — R0789 Other chest pain: Secondary | ICD-10-CM | POA: Insufficient documentation

## 2016-11-28 DIAGNOSIS — Z23 Encounter for immunization: Secondary | ICD-10-CM

## 2016-11-28 DIAGNOSIS — M545 Low back pain: Secondary | ICD-10-CM | POA: Diagnosis not present

## 2016-11-28 DIAGNOSIS — M5442 Lumbago with sciatica, left side: Secondary | ICD-10-CM | POA: Diagnosis not present

## 2016-11-28 DIAGNOSIS — R569 Unspecified convulsions: Secondary | ICD-10-CM | POA: Insufficient documentation

## 2016-11-28 LAB — LIPID PANEL
CHOLESTEROL: 242 mg/dL — AB (ref 0–200)
HDL: 74.3 mg/dL (ref >39.00)
LDL CALC: 152 mg/dL — AB (ref 0–99)
NonHDL: 167.64
TRIGLYCERIDES: 80 mg/dL (ref 0.0–149.0)
Total CHOL/HDL Ratio: 3
VLDL: 16 mg/dL (ref 0.0–40.0)

## 2016-11-28 LAB — CBC WITH DIFFERENTIAL/PLATELET
BASOS ABS: 0.1 10*3/uL (ref 0.0–0.1)
Basophils Relative: 0.6 % (ref 0.0–3.0)
EOS ABS: 0.1 10*3/uL (ref 0.0–0.7)
Eosinophils Relative: 1 % (ref 0.0–5.0)
HEMATOCRIT: 40.2 % (ref 36.0–46.0)
Hemoglobin: 13.2 g/dL (ref 12.0–15.0)
Lymphocytes Relative: 47.2 % — ABNORMAL HIGH (ref 12.0–46.0)
Lymphs Abs: 4.9 10*3/uL — ABNORMAL HIGH (ref 0.7–4.0)
MCHC: 32.8 g/dL (ref 30.0–36.0)
MCV: 86.6 fl (ref 78.0–100.0)
MONOS PCT: 7 % (ref 3.0–12.0)
Monocytes Absolute: 0.7 10*3/uL (ref 0.1–1.0)
NEUTROS ABS: 4.6 10*3/uL (ref 1.4–7.7)
Neutrophils Relative %: 44.2 % (ref 43.0–77.0)
PLATELETS: 218 10*3/uL (ref 150.0–400.0)
RBC: 4.65 Mil/uL (ref 3.87–5.11)
RDW: 13.8 % (ref 11.5–15.5)
WBC: 10.4 10*3/uL (ref 4.0–10.5)

## 2016-11-28 LAB — COMPREHENSIVE METABOLIC PANEL
ALBUMIN: 4.6 g/dL (ref 3.5–5.2)
ALK PHOS: 47 U/L (ref 39–117)
ALT: 23 U/L (ref 0–35)
AST: 29 U/L (ref 0–37)
BILIRUBIN TOTAL: 0.6 mg/dL (ref 0.2–1.2)
BUN: 19 mg/dL (ref 6–23)
CALCIUM: 10 mg/dL (ref 8.4–10.5)
CO2: 28 mEq/L (ref 19–32)
CREATININE: 0.97 mg/dL (ref 0.40–1.20)
Chloride: 104 mEq/L (ref 96–112)
GFR: 77.83 mL/min (ref >60.00)
Glucose, Bld: 95 mg/dL (ref 70–99)
Potassium: 4.5 mEq/L (ref 3.5–5.1)
Sodium: 138 mEq/L (ref 135–145)
TOTAL PROTEIN: 7.6 g/dL (ref 6.0–8.3)

## 2016-11-28 LAB — TSH: TSH: 1.09 u[IU]/mL (ref 0.35–4.50)

## 2016-11-28 LAB — HEMOGLOBIN A1C: HEMOGLOBIN A1C: 6.6 % — AB (ref 4.6–6.5)

## 2016-11-28 NOTE — Assessment & Plan Note (Signed)
Resolved for the most part EKG today normal, no acute changes Likely related to mild URI which has improved Exercising regularly with no symptoms Call if returns

## 2016-11-28 NOTE — Assessment & Plan Note (Signed)
Check lipid panel  Did not tolerate statin - does not want to retry Regular exercise and healthy diet encouraged

## 2016-11-28 NOTE — Assessment & Plan Note (Signed)
Symptomatic when exercising, hot air Does not need an inhaler

## 2016-11-28 NOTE — Assessment & Plan Note (Signed)
Neurological work up negative Unable to speak, but can hear - always knows when it will happen Worse when she was pregnant Occur rarely

## 2016-11-28 NOTE — Progress Notes (Signed)
Subjective:    Patient ID: Alexis Hartman, female    DOB: 10-25-1965, 51 y.o.   MRN: [id number]  HPI She is here to establish with a new pcp.    Chest discomfort/tighness:  It was intermittent over the past week.  It has improved. It was very transient when it came and she thought is was likely related to chest congestion.  She recently took prednisone for back/hip pain and it help her cough up some phlegm - that seemed to help.  She denies any significant cough, sob or wheeze now.  She denies cold symptoms.  She just wanted to mention the chest tightness she was having - she last had it a couple of days ago and it was transient and mild.  She is exercising regularly and has no difficulty.     Elevated blood pressure:  Her blood pressure is usually not elevated.  She has had increased stress at work.  She is compliant with a low sodium diet.  She exercises 6 days a week - she is up at 4 am.     Medications and allergies reviewed with patient and updated if appropriate.  Patient Active Problem List   Diagnosis Date Noted  . Asthma 11/28/2016  . 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 HYPERCHOLESTEROLEMIA 08/18/2010  . NECK PAIN, RIGHT 10/13/2007  . ALLERGIC RHINITIS 06/30/2007  . GERD 06/30/2007    Current Outpatient Prescriptions on File Prior to Visit  Medication Sig Dispense Refill  . Cholecalciferol (VITAMIN D) 2000 UNITS CAPS Take 1 capsule by mouth daily.    . Multiple Vitamin (MULTIVITAMIN) tablet Take 1 tablet by mouth daily.       No current facility-administered medications on file prior to visit.     Past Medical History:  Diagnosis Date  . Acute bronchitis   . Allergic rhinitis, cause unspecified   . Asthma   . Atypical face pain   . Cervicalgia   . Esophageal reflux   . Headache(784.0)   . Other convulsions   . Pure hypercholesterolemia     Past Surgical History:  Procedure Laterality Date  . CYSTECTOMY  01/2004     Dr. Eda Paschal  . LAPAROSCOPIC CHOLECYSTECTOMY  05/2010   Dr. Abbey Chatters  . VESICOVAGINAL FISTULA CLOSURE W/ TAH  01/2004   Dr. Eda Paschal    Social History   Social History  . Marital status: Married    Spouse name: N/A  . Number of children: 3  . Years of education: N/A   Occupational History  . manager [employer]   Social History Main Topics  . Smoking status: Never Smoker  . Smokeless tobacco: Never Used  . Alcohol use No  . Drug use: No  . Sexual activity: Not Asked   Other Topics Concern  . None   Social History Narrative  . None    Family History  Problem Relation Age of Onset  . Diabetes Father   . Hypertension Mother   . Diabetes Mother   . Asthma Sister     Review of Systems  Constitutional: Negative for chills and fever.  HENT: Positive for congestion.   Eyes: Negative for visual disturbance.  Respiratory: Positive for cough (improved, ? recent URI) and wheezing (mild, not recent from ? URI). Negative for shortness of breath.   Cardiovascular: Positive for chest pain. Negative for palpitations and leg swelling.  Gastrointestinal: Positive for abdominal pain (intermittent, mild). Negative for blood in stool, constipation  and diarrhea.       No gerd  Genitourinary: Negative for dysuria and hematuria.  Musculoskeletal: Positive for arthralgias (hip pain) and back pain.  Neurological: Positive for headaches (posterior head - rare). Negative for light-headedness.  Psychiatric/Behavioral: Negative for dysphoric mood. The patient is not nervous/anxious.        Objective:   Vitals:   11/28/16 1339  BP: (!) 140/96  Pulse: 71  Resp: 16  Temp: 98.1 F (36.7 C)   Filed Weights   11/28/16 1339  Weight: 197 lb (89.4 kg)   Body mass index is 28.27 kg/m.  Wt Readings from Last 3 Encounters:  11/28/16 197 lb (89.4 kg)  08/25/14 201 lb 2 oz (91.2 kg)  03/31/14 209 lb 12.8 oz (95.2 kg)     Physical Exam Constitutional: She appears  well-developed and well-nourished. No distress.  HENT:  Head: Normocephalic and atraumatic.  Right Ear: External ear normal. Normal ear canal and TM Left Ear: External ear normal.  Normal ear canal and TM Mouth/Throat: Oropharynx is clear and moist.  Eyes: Conjunctivae and EOM are normal.  Neck: Neck supple. No tracheal deviation present. No thyromegaly present.  No carotid bruit  Cardiovascular: Normal rate, regular rhythm and normal heart sounds.   No murmur heard.  No edema. Pulmonary/Chest: Effort normal and breath sounds normal. No respiratory distress. She has no wheezes. She has no rales.  Breast: deferred to Gyn Abdominal: Soft. She exhibits no distension. There is no tenderness.  Lymphadenopathy: She has no cervical adenopathy.  Skin: Skin is warm and dry. She is not diaphoretic.  Psychiatric: She has a normal mood and affect. Her behavior is normal.         Assessment & Plan:   Physical exam: Screening blood work ordered Immunizations  tdap today Colonoscopy   Never had one - referred Mammogram  Up to date  Gyn   Due - will schedule Eye exams - Up to date  EKG  - done today for chest pain that has been intermittent - NSR at 60 bpm, normal EKG - now normal compared to EKG from 2014 Exercise - regular exercise 6/week Weight - has lost weight Skin   No concerns Substance abuse  none  See Problem List for Assessment and Plan of chronic medical problems.  FU annually

## 2016-11-28 NOTE — Patient Instructions (Addendum)
Test(s) ordered today. Your results will be released to MyChart (or called to you) after review, usually within 72hours after test completion. If any changes need to be made, you will be notified at that same time.  All other Health Maintenance issues reviewed.   All recommended immunizations and age-appropriate screenings are up-to-date or discussed.  Tetanus immunization administered today.   Medications reviewed and updated.  No changes recommended at this time.   Please followup in one year   Health Maintenance, Female Adopting a healthy lifestyle and getting preventive care can go a long way to promote health and wellness. Talk with your health care provider about what schedule of regular examinations is right for you. This is a good chance for you to check in with your provider about disease prevention and staying healthy. In between checkups, there are plenty of things you can do on your own. Experts have done a lot of research about which lifestyle changes and preventive measures are most likely to keep you healthy. Ask your health care provider for more information. Weight and diet Eat a healthy diet  Be sure to include plenty of vegetables, fruits, low-fat dairy products, and lean protein.  Do not eat a lot of foods high in solid fats, added sugars, or salt.  Get regular exercise. This is one of the most important things you can do for your health. ? Most adults should exercise for at least 150 minutes each week. The exercise should increase your heart rate and make you sweat (moderate-intensity exercise). ? Most adults should also do strengthening exercises at least twice a week. This is in addition to the moderate-intensity exercise.  Maintain a healthy weight  Body mass index (BMI) is a measurement that can be used to identify possible weight problems. It estimates body fat based on height and weight. Your health care provider can help determine your BMI and help you  achieve or maintain a healthy weight.  For females 20 years of age and older: ? A BMI below 18.5 is considered underweight. ? A BMI of 18.5 to 24.9 is normal. ? A BMI of 25 to 29.9 is considered overweight. ? A BMI of 30 and above is considered obese.  Watch levels of cholesterol and blood lipids  You should start having your blood tested for lipids and cholesterol at 51 years of age, then have this test every 5 years.  You may need to have your cholesterol levels checked more often if: ? Your lipid or cholesterol levels are high. ? You are older than 50 years of age. ? You are at high risk for heart disease.  Cancer screening Lung Cancer  Lung cancer screening is recommended for adults 55-80 years old who are at high risk for lung cancer because of a history of smoking.  A yearly low-dose CT scan of the lungs is recommended for people who: ? Currently smoke. ? Have quit within the past 15 years. ? Have at least a 30-pack-year history of smoking. A pack year is smoking an average of one pack of cigarettes a day for 1 year.  Yearly screening should continue until it has been 15 years since you quit.  Yearly screening should stop if you develop a health problem that would prevent you from having lung cancer treatment.  Breast Cancer  Practice breast self-awareness. This means understanding how your breasts normally appear and feel.  It also means doing regular breast self-exams. Let your health care provider know about any changes,   no matter how small.  If you are in your 20s or 30s, you should have a clinical breast exam (CBE) by a health care provider every 1-3 years as part of a regular health exam.  If you are 66 or older, have a CBE every year. Also consider having a breast X-ray (mammogram) every year.  If you have a family history of breast cancer, talk to your health care provider about genetic screening.  If you are at high risk for breast cancer, talk to your health  care provider about having an MRI and a mammogram every year.  Breast cancer gene (BRCA) assessment is recommended for women who have family members with BRCA-related cancers. BRCA-related cancers include: ? Breast. ? Ovarian. ? Tubal. ? Peritoneal cancers.  Results of the assessment will determine the need for genetic counseling and BRCA1 and BRCA2 testing.  Cervical Cancer Your health care provider may recommend that you be screened regularly for cancer of the pelvic organs (ovaries, uterus, and vagina). This screening involves a pelvic examination, including checking for microscopic changes to the surface of your cervix (Pap test). You may be encouraged to have this screening done every 3 years, beginning at age 32.  For women ages 45-65, health care providers may recommend pelvic exams and Pap testing every 3 years, or they may recommend the Pap and pelvic exam, combined with testing for human papilloma virus (HPV), every 5 years. Some types of HPV increase your risk of cervical cancer. Testing for HPV may also be done on women of any age with unclear Pap test results.  Other health care providers may not recommend any screening for nonpregnant women who are considered low risk for pelvic cancer and who do not have symptoms. Ask your health care provider if a screening pelvic exam is right for you.  If you have had past treatment for cervical cancer or a condition that could lead to cancer, you need Pap tests and screening for cancer for at least 20 years after your treatment. If Pap tests have been discontinued, your risk factors (such as having a new sexual partner) need to be reassessed to determine if screening should resume. Some women have medical problems that increase the chance of getting cervical cancer. In these cases, your health care provider may recommend more frequent screening and Pap tests.  Colorectal Cancer  This type of cancer can be detected and often  prevented.  Routine colorectal cancer screening usually begins at 51 years of age and continues through 51 years of age.  Your health care provider may recommend screening at an earlier age if you have risk factors for colon cancer.  Your health care provider may also recommend using home test kits to check for hidden blood in the stool.  A small camera at the end of a tube can be used to examine your colon directly (sigmoidoscopy or colonoscopy). This is done to check for the earliest forms of colorectal cancer.  Routine screening usually begins at age 60.  Direct examination of the colon should be repeated every 5-10 years through 51 years of age. However, you may need to be screened more often if early forms of precancerous polyps or small growths are found.  Skin Cancer  Check your skin from head to toe regularly.  Tell your health care provider about any new moles or changes in moles, especially if there is a change in a mole's shape or color.  Also tell your health care provider if you  have a mole that is larger than the size of a pencil eraser.  Always use sunscreen. Apply sunscreen liberally and repeatedly throughout the day.  Protect yourself by wearing long sleeves, pants, a wide-brimmed hat, and sunglasses whenever you are outside.  Heart disease, diabetes, and high blood pressure  High blood pressure causes heart disease and increases the risk of stroke. High blood pressure is more likely to develop in: ? People who have blood pressure in the high end of the normal range (130-139/85-89 mm Hg). ? People who are overweight or obese. ? People who are African American.  If you are 18-39 years of age, have your blood pressure checked every 3-5 years. If you are 40 years of age or older, have your blood pressure checked every year. You should have your blood pressure measured twice-once when you are at a hospital or clinic, and once when you are not at a hospital or clinic.  Record the average of the two measurements. To check your blood pressure when you are not at a hospital or clinic, you can use: ? An automated blood pressure machine at a pharmacy. ? A home blood pressure monitor.  If you are between 55 years and 79 years old, ask your health care provider if you should take aspirin to prevent strokes.  Have regular diabetes screenings. This involves taking a blood sample to check your fasting blood sugar level. ? If you are at a normal weight and have a low risk for diabetes, have this test once every three years after 51 years of age. ? If you are overweight and have a high risk for diabetes, consider being tested at a younger age or more often. Preventing infection Hepatitis B  If you have a higher risk for hepatitis B, you should be screened for this virus. You are considered at high risk for hepatitis B if: ? You were born in a country where hepatitis B is common. Ask your health care provider which countries are considered high risk. ? Your parents were born in a high-risk country, and you have not been immunized against hepatitis B (hepatitis B vaccine). ? You have HIV or AIDS. ? You use needles to inject street drugs. ? You live with someone who has hepatitis B. ? You have had sex with someone who has hepatitis B. ? You get hemodialysis treatment. ? You take certain medicines for conditions, including cancer, organ transplantation, and autoimmune conditions.  Hepatitis C  Blood testing is recommended for: ? Everyone born from 1945 through 1965. ? Anyone with known risk factors for hepatitis C.  Sexually transmitted infections (STIs)  You should be screened for sexually transmitted infections (STIs) including gonorrhea and chlamydia if: ? You are sexually active and are younger than 51 years of age. ? You are older than 51 years of age and your health care provider tells you that you are at risk for this type of infection. ? Your sexual  activity has changed since you were last screened and you are at an increased risk for chlamydia or gonorrhea. Ask your health care provider if you are at risk.  If you do not have HIV, but are at risk, it may be recommended that you take a prescription medicine daily to prevent HIV infection. This is called pre-exposure prophylaxis (PrEP). You are considered at risk if: ? You are sexually active and do not regularly use condoms or know the HIV status of your partner(s). ? You take drugs by injection. ?   You are sexually active with a partner who has HIV.  Talk with your health care provider about whether you are at high risk of being infected with HIV. If you choose to begin PrEP, you should first be tested for HIV. You should then be tested every 3 months for as long as you are taking PrEP. Pregnancy  If you are premenopausal and you may become pregnant, ask your health care provider about preconception counseling.  If you may become pregnant, take 400 to 800 micrograms (mcg) of folic acid every day.  If you want to prevent pregnancy, talk to your health care provider about birth control (contraception). Osteoporosis and menopause  Osteoporosis is a disease in which the bones lose minerals and strength with aging. This can result in serious bone fractures. Your risk for osteoporosis can be identified using a bone density scan.  If you are 65 years of age or older, or if you are at risk for osteoporosis and fractures, ask your health care provider if you should be screened.  Ask your health care provider whether you should take a calcium or vitamin D supplement to lower your risk for osteoporosis.  Menopause may have certain physical symptoms and risks.  Hormone replacement therapy may reduce some of these symptoms and risks. Talk to your health care provider about whether hormone replacement therapy is right for you. Follow these instructions at home:  Schedule regular health, dental,  and eye exams.  Stay current with your immunizations.  Do not use any tobacco products including cigarettes, chewing tobacco, or electronic cigarettes.  If you are pregnant, do not drink alcohol.  If you are breastfeeding, limit how much and how often you drink alcohol.  Limit alcohol intake to no more than 1 drink per day for nonpregnant women. One drink equals 12 ounces of beer, 5 ounces of wine, or 1 ounces of hard liquor.  Do not use street drugs.  Do not share needles.  Ask your health care provider for help if you need support or information about quitting drugs.  Tell your health care provider if you often feel depressed.  Tell your health care provider if you have ever been abused or do not feel safe at home. This information is not intended to replace advice given to you by your health care provider. Make sure you discuss any questions you have with your health care provider. Document Released: 12/30/2010 Document Revised: 11/22/2015 Document Reviewed: 03/20/2015 [employer]vier Interactive Patient Education  2018 [employer]  

## 2016-11-28 NOTE — Assessment & Plan Note (Signed)
Check a1c Low sugar / carb diet Stressed regular exercise, keeping weight down  

## 2016-11-29 ENCOUNTER — Encounter: Payer: Self-pay | Admitting: Internal Medicine

## 2016-12-18 DIAGNOSIS — M5442 Lumbago with sciatica, left side: Secondary | ICD-10-CM | POA: Diagnosis not present

## 2016-12-18 DIAGNOSIS — M545 Low back pain: Secondary | ICD-10-CM | POA: Diagnosis not present

## 2016-12-21 DIAGNOSIS — M545 Low back pain: Secondary | ICD-10-CM | POA: Diagnosis not present

## 2016-12-22 ENCOUNTER — Encounter (HOSPITAL_COMMUNITY): Payer: Self-pay | Admitting: Emergency Medicine

## 2016-12-22 ENCOUNTER — Emergency Department (HOSPITAL_COMMUNITY)
Admission: EM | Admit: 2016-12-22 | Discharge: 2016-12-22 | Disposition: A | Discharge status: Home / Self Care | Payer: 59 | Attending: Emergency Medicine | Admitting: Emergency Medicine

## 2016-12-22 DIAGNOSIS — M5416 Radiculopathy, lumbar region: Secondary | ICD-10-CM | POA: Diagnosis not present

## 2016-12-22 DIAGNOSIS — J45909 Unspecified asthma, uncomplicated: Secondary | ICD-10-CM | POA: Diagnosis not present

## 2016-12-22 DIAGNOSIS — M545 Low back pain: Secondary | ICD-10-CM | POA: Diagnosis not present

## 2016-12-22 MED ORDER — OXYCODONE-ACETAMINOPHEN 5-325 MG PO TABS
1.0000 | ORAL_TABLET | ORAL | Status: DC | PRN
  Administered 2016-12-22: 1 via ORAL

## 2016-12-22 MED ORDER — PREDNISONE 20 MG PO TABS: 40.0000 mg | ORAL_TABLET | Freq: Every day | ORAL | 0 refills | Status: DC

## 2016-12-22 MED ORDER — DEXAMETHASONE SODIUM PHOSPHATE 10 MG/ML IJ SOLN
10.0000 mg | Freq: Once | INTRAMUSCULAR | Status: AC
  Administered 2016-12-22: 10 mg via INTRAMUSCULAR
  Filled 2016-12-22: qty 1

## 2016-12-22 MED ORDER — HYDROCODONE-ACETAMINOPHEN 5-325 MG PO TABS: 2.0000 | ORAL_TABLET | Freq: Four times a day (QID) | ORAL | 0 refills | Status: DC | PRN

## 2016-12-22 MED ORDER — OXYCODONE-ACETAMINOPHEN 5-325 MG PO TABS
ORAL_TABLET | ORAL | Status: AC
  Filled 2016-12-22: qty 1

## 2016-12-22 MED ORDER — DIAZEPAM 5 MG/ML IJ SOLN
5.0000 mg | Freq: Once | INTRAMUSCULAR | Status: AC
  Administered 2016-12-22: 5 mg via INTRAMUSCULAR
  Filled 2016-12-22: qty 2

## 2016-12-22 MED ORDER — HYDROCODONE-ACETAMINOPHEN 5-325 MG PO TABS
1.0000 | ORAL_TABLET | Freq: Once | ORAL | Status: AC
  Administered 2016-12-22: 1 via ORAL
  Filled 2016-12-22: qty 1

## 2016-12-22 NOTE — Discharge Instructions (Signed)
Call your doctor today about your MRI results and follow-up.

## 2016-12-22 NOTE — ED Provider Notes (Signed)
[facility]-EMERGENCY DEPT Provider Note   CSN: [id number] Arrival date & time: 12/22/16  0156     History   Chief Complaint Chief Complaint  Patient presents with  . Leg Pain    HPI Alexis Hartman is a 51 y.o. female.  Patient presents to the emergency department with a chief complaint of left-sided low back pain that radiates to her left leg. She states that she is currently being worked up for a herniated disc by Dr. Luiz Blare. She states that she had an MRI yesterday morning at [facility], but does not have the results. She states that her symptoms have persisted, and have worsened. She denies any lower extremity weakness, but does report pain. She states that she has been resistant to taking pain medications, because she does not like how they make her feel. She reports that she did have good improvement while taking a steroid, but after this was stopped, her symptoms have gradually returned. She denies any bowel or bladder incontinence. Denies any saddle anesthesia. Denies any numbness or weakness. She denies any fevers chills. Denies history of cancer in herself.   The history is provided by the patient. No language interpreter was used.    Past Medical History:  Diagnosis Date  . Acute bronchitis   . Allergic rhinitis, cause unspecified   . Asthma   . Atypical face pain   . Cervicalgia   . Esophageal reflux   . Headache(784.0)   . Other convulsions   . Pure hypercholesterolemia     Patient Active Problem List   Diagnosis Date Noted  . Asthma 11/28/2016  . 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 Family history of diabetes mellitus (DM) 11/28/2016  . Chest tightness 11/28/2016  . 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 HYPERCHOLESTEROLEMIA 08/18/2010  . NECK PAIN, RIGHT 10/13/2007  . ALLERGIC RHINITIS 06/30/2007    Past Surgical History:  Procedure Laterality Date  . CYSTECTOMY  01/2004   Dr. Eda Paschal  . LAPAROSCOPIC CHOLECYSTECTOMY  05/2010   Dr. Abbey Chatters    . VESICOVAGINAL FISTULA CLOSURE W/ TAH  01/2004   Dr. Eda Paschal    OB History    No data available       Home Medications    Prior to Admission medications   Medication Sig Start Date End Date Taking? Authorizing Provider  Cholecalciferol (VITAMIN D) 2000 UNITS CAPS Take 1 capsule by mouth daily.    [provider]  Multiple Vitamin (MULTIVITAMIN) tablet Take 1 tablet by mouth daily.      [provider]    Family History Family History  Problem Relation Age of Onset  . Diabetes Father   . Hypertension Mother   . Diabetes Mother   . Alcohol abuse Mother        in remission  . Asthma Sister   . Depression Paternal Uncle   . Hyperlipidemia Maternal Grandmother     Social History Social History  Substance Use Topics  . Smoking status: Never Smoker  . Smokeless tobacco: Never Used  . Alcohol use No     Allergies   Patient has no known allergies.   Review of Systems Review of Systems  All other systems reviewed and are negative.    Physical Exam Updated Vital Signs BP (!) 181/120 (BP Location: Right Arm)   Pulse 69   Temp 98.2 F (36.8 C) (Oral)   Resp 20   SpO2 99%   Physical Exam Physical Exam  Constitutional: Pt appears well-developed and well-nourished. No distress.  HENT:  Head: Normocephalic and atraumatic.  Mouth/Throat: Oropharynx is clear and moist. No oropharyngeal exudate.  Eyes: Conjunctivae are normal.  Neck: Normal range of motion. Neck supple.  No meningismus Cardiovascular: Normal rate, regular rhythm and intact distal pulses.   Pulmonary/Chest: Effort normal and breath sounds normal. No respiratory distress. Pt has no wheezes.  Abdominal: Pt exhibits no distension Musculoskeletal:  Left lumbar paraspinal muscles tender to palpation, no bony CTLS spine tenderness, deformity, step-off, or crepitus Lymphadenopathy: Pt has no cervical adenopathy.  Neurological: Pt is alert and oriented Speech is clear and goal  oriented, follows commands Normal 5/5 strength in upper and lower extremities bilaterally including dorsiflexion and plantar flexion, strong and equal grip strength Sensation intact Great toe extension intact Moves extremities without ataxia, coordination intact Ankle and knee jerk reflexes intact and symmetrical  Normal gait Normal balance No Clonus Skin: Skin is warm and dry. No rash noted. Pt is not diaphoretic. No erythema.  Psychiatric: Pt has a normal mood and affect. Behavior is normal.  Nursing note and vitals reviewed.   ED Treatments / Results  Labs (all labs ordered are listed, but only abnormal results are displayed) Labs Reviewed - No data to display  EKG  EKG Interpretation None       Radiology No results found.  Procedures Procedures (including critical care time)  Medications Ordered in ED Medications  oxyCODONE-acetaminophen (PERCOCET/ROXICET) 5-325 MG per tablet 1 tablet (1 tablet Oral Given 12/22/16 0205)  oxyCODONE-acetaminophen (PERCOCET/ROXICET) 5-325 MG per tablet (not administered)  HYDROcodone-acetaminophen (NORCO/VICODIN) 5-325 MG per tablet 1 tablet (not administered)  dexamethasone (DECADRON) injection 10 mg (not administered)  diazepam (VALIUM) injection 5 mg (not administered)     Initial Impression / Assessment and Plan / ED Course  I have reviewed the triage vital signs and the nursing notes.  Pertinent labs & imaging results that were available during my care of the patient were reviewed by me and considered in my medical decision making (see chart for details).     Patient with symptoms consistent with left-sided lumbar radiculopathy. She had an MRI yesterday, but does not have the results. She has close follow-up with her orthopedic later this week. She states that her symptoms have worsened as far as pain, but she does not have any numbness or weakness. She denies any bowel or bladder incontinence. Denies any saddle anesthesia. She  is afebrile.  Plan is to have the patient call her provider in the morning to obtain MRI results if possible. I will start the patient back on prednisone, as this was originally very successful for the patient. I will also attempt to control the patient's pain while she is in the emergency department. Currently have any evidence of cauda equina, and I feel that she can be discharged with close outpatient follow-up.  Final Clinical Impressions(s) / ED Diagnoses   Final diagnoses:  Lumbar radiculopathy    New Prescriptions New Prescriptions   No medications on file     Roxy HorsemanBrowning, Brittley Regner, Cordelia Poche-C 12/22/16 0541    Zadie RhineWickline, Donald, MD 12/23/16 [phone]

## 2016-12-22 NOTE — ED Triage Notes (Signed)
Pt presents to ED for assessment of left lower back and leg pain, acute on chronic.  Pt states she had an MRI today for further evaluation, and has been experiencing an increase in pain and numbness since the exam.  Pt states she normally takes ibuprofen at home for pain and has no relief with same at this time.

## 2016-12-23 DIAGNOSIS — M545 Low back pain: Secondary | ICD-10-CM | POA: Diagnosis not present

## 2016-12-23 DIAGNOSIS — M5442 Lumbago with sciatica, left side: Secondary | ICD-10-CM | POA: Diagnosis not present

## 2016-12-26 DIAGNOSIS — M5416 Radiculopathy, lumbar region: Secondary | ICD-10-CM | POA: Diagnosis not present

## 2016-12-29 ENCOUNTER — Other Ambulatory Visit: Payer: Self-pay | Admitting: Orthopedic Surgery

## 2017-01-01 DIAGNOSIS — M5416 Radiculopathy, lumbar region: Secondary | ICD-10-CM | POA: Diagnosis not present

## 2017-01-05 ENCOUNTER — Ambulatory Visit (HOSPITAL_COMMUNITY)
Admission: RE | Admit: 2017-01-05 | Discharge: 2017-01-05 | Disposition: A | Discharge status: Home / Self Care | Payer: 59 | Source: Ambulatory Visit | Attending: Orthopedic Surgery | Admitting: Orthopedic Surgery

## 2017-01-05 ENCOUNTER — Encounter (HOSPITAL_COMMUNITY): Payer: Self-pay

## 2017-01-05 ENCOUNTER — Encounter (HOSPITAL_COMMUNITY)
Admission: RE | Admit: 2017-01-05 | Discharge: 2017-01-05 | Disposition: A | Discharge status: Home / Self Care | Payer: 59 | Source: Ambulatory Visit | Attending: Orthopedic Surgery | Admitting: Orthopedic Surgery

## 2017-01-05 ENCOUNTER — Telehealth: Payer: Self-pay | Admitting: Internal Medicine

## 2017-01-05 DIAGNOSIS — Z01810 Encounter for preprocedural cardiovascular examination: Secondary | ICD-10-CM | POA: Diagnosis not present

## 2017-01-05 DIAGNOSIS — Z01818 Encounter for other preprocedural examination: Secondary | ICD-10-CM

## 2017-01-05 DIAGNOSIS — Z01812 Encounter for preprocedural laboratory examination: Secondary | ICD-10-CM | POA: Insufficient documentation

## 2017-01-05 LAB — CBC WITH DIFFERENTIAL/PLATELET
Basophils Absolute: 0.1 10*3/uL (ref 0.0–0.1)
Basophils Relative: 1 %
Eosinophils Absolute: 0.1 10*3/uL (ref 0.0–0.7)
Eosinophils Relative: 2 %
HEMATOCRIT: 42.7 % (ref 36.0–46.0)
HEMOGLOBIN: 14 g/dL (ref 12.0–15.0)
LYMPHS ABS: 3 10*3/uL (ref 0.7–4.0)
LYMPHS PCT: 36 %
MCH: 28.6 pg (ref 26.0–34.0)
MCHC: 32.8 g/dL (ref 30.0–36.0)
MCV: 87.3 fL (ref 78.0–100.0)
MONO ABS: 0.5 10*3/uL (ref 0.1–1.0)
MONOS PCT: 6 %
NEUTROS ABS: 4.5 10*3/uL (ref 1.7–7.7)
NEUTROS PCT: 55 %
Platelets: 225 10*3/uL (ref 150–400)
RBC: 4.89 MIL/uL (ref 3.87–5.11)
RDW: 13.6 % (ref 11.5–15.5)
WBC: 8.2 10*3/uL (ref 4.0–10.5)

## 2017-01-05 LAB — GLUCOSE, CAPILLARY: GLUCOSE-CAPILLARY: 104 mg/dL — AB (ref 65–99)

## 2017-01-05 LAB — TYPE AND SCREEN
ABO/RH(D): A POS
ANTIBODY SCREEN: NEGATIVE

## 2017-01-05 LAB — COMPREHENSIVE METABOLIC PANEL
ALBUMIN: 4.6 g/dL (ref 3.5–5.0)
ALK PHOS: 49 U/L (ref 38–126)
ALT: 20 U/L (ref 14–54)
ANION GAP: 7 (ref 5–15)
AST: 24 U/L (ref 15–41)
BILIRUBIN TOTAL: 0.7 mg/dL (ref 0.3–1.2)
BUN: 13 mg/dL (ref 6–20)
CALCIUM: 10.2 mg/dL (ref 8.9–10.3)
CO2: 27 mmol/L (ref 22–32)
Chloride: 107 mmol/L (ref 101–111)
Creatinine, Ser: 1.03 mg/dL — ABNORMAL HIGH (ref 0.44–1.00)
GFR calc Af Amer: >60 mL/min (ref >60)
GLUCOSE: 114 mg/dL — AB (ref 65–99)
POTASSIUM: 4.4 mmol/L (ref 3.5–5.1)
Sodium: 141 mmol/L (ref 135–145)
TOTAL PROTEIN: 7.7 g/dL (ref 6.5–8.1)

## 2017-01-05 LAB — URINALYSIS, ROUTINE W REFLEX MICROSCOPIC
BACTERIA UA: NONE SEEN
BILIRUBIN URINE: NEGATIVE
Glucose, UA: NEGATIVE mg/dL
Ketones, ur: NEGATIVE mg/dL
Leukocytes, UA: NEGATIVE
NITRITE: NEGATIVE
PROTEIN: NEGATIVE mg/dL
Specific Gravity, Urine: 1.018 (ref 1.005–1.030)
pH: 6 (ref 5.0–8.0)

## 2017-01-05 LAB — SURGICAL PCR SCREEN
MRSA, PCR: NEGATIVE
STAPHYLOCOCCUS AUREUS: POSITIVE — AB

## 2017-01-05 LAB — APTT: aPTT: 26 seconds (ref 24–36)

## 2017-01-05 LAB — PROTIME-INR
INR: 1.04
PROTHROMBIN TIME: 13.6 s (ref 11.4–15.2)

## 2017-01-05 LAB — ABO/RH: ABO/RH(D): A POS

## 2017-01-05 MED ORDER — AMLODIPINE BESYLATE 5 MG PO TABS: 5.0000 mg | ORAL_TABLET | Freq: Every day | ORAL | 3 refills | Status: DC

## 2017-01-05 NOTE — Progress Notes (Signed)
Patient arrived in my office in excruiating pain.  Mainly in her left hip and leg.  Hyperventilating and teary.  BP initially was 200/117.  She denies having any HTN, cardiac issues, and states it is all pain related - pain medication taken before she arrived. PCP is Dr. Cheryll CockayneStacy Burns.  LOV 11/2016 About 15-20 min into interview, and after changing to recliner she was calmer and not in the same distress.  BP 178/113.    At the end of interview, BP 180/88 I did give the patient a list of her readings today, and instructed her to call her PCP and seek information, treatment or advice.

## 2017-01-05 NOTE — Pre-Procedure Instructions (Signed)
    Alexis Hartman  01/05/2017      [facility] [id number] [place] - [address] [facility] [address] [place] [id number] Phone: [phone] Fax: [phone]    Your procedure is scheduled on Wednesday, July 11th   [facility] Admitting at 8:30 AM             (posted surgery time is 11:30 am - 1:30 pm - your arrival time is per your surgeon's request)   Call this number if you have problems the morning of surgery:  [phone], for other questions, call [phone] Mon-Fri from 8a-4p   Remember:   Do not eat food or drink liquids after midnight Tuesday.              4-5 days prior to surgery, STOP TAKING ANY vitamins, herbal supplements, anti-inflammatories   Take these medicines the morning of surgery with A SIP OF WATER : Hydrocodone.   Do not wear jewelry, make-up or nail polish.  Do not wear lotions, powders, or perfumes, or deoderant.  Do not shave 48 hours prior to surgery.     Do not bring valuables to the hospital.  [facility] is not responsible for any belongings or valuables.  Contacts, dentures or bridgework may not be worn into surgery.  Leave your suitcase in the car.  After surgery it may be brought to your room.  For patients admitted to the hospital, discharge time will be determined by your treatment team.  Patients discharged the day of surgery will not be allowed to drive home, and will need someone to stay with you for the first 24 hrs.  Please read over the following fact sheets that you were given. Pain Booklet, MRSA Information and Surgical Site Infection Prevention

## 2017-01-05 NOTE — Telephone Encounter (Signed)
Pt called in and said that she went in for her Pre op today and her BP was high.  They told her to call primary care and see what needs to be done about it   200/114 178-113 180/88  These are all 15 mins apart, sitting down   Best number [phone]

## 2017-01-05 NOTE — Telephone Encounter (Signed)
Called listed number and spoke to [place].  She states she was in a lot of pain when they checked her BP.    Start amlodipine 5 mg daily.  She will monitor BP

## 2017-01-06 ENCOUNTER — Encounter: Payer: Self-pay | Admitting: Internal Medicine

## 2017-01-06 NOTE — Progress Notes (Signed)
Per request of Rica MastAngela Kabbe, NP pt called and instructed to take Amlodipine in the AM. Pt voiced understanding.

## 2017-01-06 NOTE — Progress Notes (Signed)
Anesthesia Chart Review:  Pt is a 51 year old female scheduled for left sided L5-S1 microdiscectomy on 01/07/2017 with Estill BambergMark Dumonski, MD  - PCP is Cheryll CockayneStacy Burns, M.D.  [facility]  Hyperlipidemia, asthma, GERD, pseudoseizures (2002). Never smoker. BMI 28.  Medications include: Amlodipine (new 01/05/17)  BP (!) 202/114 Comment: Debbie, RN aware  Pulse 81   Temp 36.6 C (Oral)   Resp 20   Ht 5\' 10"  (1.778 m)   Wt 192 lb 7.4 oz (87.3 kg)   SpO2 100%   BMI 27.62 kg/m    Initial BP 202/114. Recheck 178/113, 180/88.   - PAT RN discussed with pt she has HTN (based on multiple prior elevated BP readings and today's readings), advised pt to reach out to PCP for treatment.  Pt spoke with PCP and was started on amlodipine 5mg  daily by PCP 01/05/17  Preoperative labs reviewed.    CXR 01/05/17: No active cardiopulmonary disease.  EKG 01/05/17: NSR  If BP acceptable DOS, I anticipate patient can proceed as scheduled.  Rica Mastngela Stephane Niemann, FNP-BC [facility] Phone: [phone] 01/06/2017 10:42 AM

## 2017-01-07 ENCOUNTER — Encounter (HOSPITAL_COMMUNITY): Payer: Self-pay | Admitting: Orthopedic Surgery

## 2017-01-07 ENCOUNTER — Ambulatory Visit (HOSPITAL_COMMUNITY): Payer: 59 | Admitting: Anesthesiology

## 2017-01-07 ENCOUNTER — Ambulatory Visit (HOSPITAL_COMMUNITY)
Admission: RE | Admit: 2017-01-07 | Discharge: 2017-01-07 | Disposition: A | Discharge status: Home / Self Care | Payer: 59 | Source: Ambulatory Visit | Attending: Orthopedic Surgery | Admitting: Orthopedic Surgery

## 2017-01-07 ENCOUNTER — Encounter (HOSPITAL_COMMUNITY)
Admission: RE | Disposition: A | Discharge status: Home / Self Care | Payer: Self-pay | Source: Ambulatory Visit | Attending: Orthopedic Surgery

## 2017-01-07 ENCOUNTER — Ambulatory Visit (HOSPITAL_COMMUNITY): Payer: 59 | Admitting: Emergency Medicine

## 2017-01-07 ENCOUNTER — Ambulatory Visit (HOSPITAL_COMMUNITY): Payer: 59

## 2017-01-07 DIAGNOSIS — J309 Allergic rhinitis, unspecified: Secondary | ICD-10-CM | POA: Diagnosis not present

## 2017-01-07 DIAGNOSIS — Z79899 Other long term (current) drug therapy: Secondary | ICD-10-CM | POA: Diagnosis not present

## 2017-01-07 DIAGNOSIS — M5117 Intervertebral disc disorders with radiculopathy, lumbosacral region: Secondary | ICD-10-CM | POA: Diagnosis not present

## 2017-01-07 DIAGNOSIS — Z419 Encounter for procedure for purposes other than remedying health state, unspecified: Secondary | ICD-10-CM

## 2017-01-07 DIAGNOSIS — M5127 Other intervertebral disc displacement, lumbosacral region: Secondary | ICD-10-CM | POA: Diagnosis not present

## 2017-01-07 DIAGNOSIS — J45909 Unspecified asthma, uncomplicated: Secondary | ICD-10-CM | POA: Diagnosis not present

## 2017-01-07 DIAGNOSIS — M5417 Radiculopathy, lumbosacral region: Secondary | ICD-10-CM | POA: Diagnosis not present

## 2017-01-07 DIAGNOSIS — M79605 Pain in left leg: Secondary | ICD-10-CM | POA: Diagnosis not present

## 2017-01-07 HISTORY — PX: LUMBAR LAMINECTOMY/DECOMPRESSION MICRODISCECTOMY: SHX5026

## 2017-01-07 SURGERY — LUMBAR LAMINECTOMY/DECOMPRESSION MICRODISCECTOMY 1 LEVEL
Anesthesia: General | Laterality: Left

## 2017-01-07 MED ORDER — ROCURONIUM BROMIDE 100 MG/10ML IV SOLN
INTRAVENOUS | Status: DC | PRN
  Administered 2017-01-07: 10 mg via INTRAVENOUS
  Administered 2017-01-07: 20 mg via INTRAVENOUS
  Administered 2017-01-07: 30 mg via INTRAVENOUS

## 2017-01-07 MED ORDER — METHYLPREDNISOLONE ACETATE 40 MG/ML IJ SUSP
INTRAMUSCULAR | Status: DC | PRN
  Administered 2017-01-07: 40 mg

## 2017-01-07 MED ORDER — FENTANYL CITRATE (PF) 100 MCG/2ML IJ SOLN
INTRAMUSCULAR | Status: AC
  Administered 2017-01-07: 100 ug via INTRAVENOUS
  Filled 2017-01-07: qty 2

## 2017-01-07 MED ORDER — BUPIVACAINE-EPINEPHRINE (PF) 0.25% -1:200000 IJ SOLN
INTRAMUSCULAR | Status: AC
  Filled 2017-01-07: qty 30

## 2017-01-07 MED ORDER — LACTATED RINGERS IV SOLN
INTRAVENOUS | Status: DC
Start: 2017-01-07 — End: 2017-01-07
  Administered 2017-01-07 (×2): via INTRAVENOUS

## 2017-01-07 MED ORDER — PHENYLEPHRINE HCL 10 MG/ML IJ SOLN
INTRAMUSCULAR | Status: DC | PRN
  Administered 2017-01-07: 80 ug via INTRAVENOUS

## 2017-01-07 MED ORDER — CEFAZOLIN SODIUM-DEXTROSE 2-4 GM/100ML-% IV SOLN
INTRAVENOUS | Status: AC
  Filled 2017-01-07: qty 100

## 2017-01-07 MED ORDER — THROMBIN 20000 UNITS EX SOLR
CUTANEOUS | Status: AC
  Filled 2017-01-07: qty 20000

## 2017-01-07 MED ORDER — MEPERIDINE HCL 25 MG/ML IJ SOLN: 6.2500 mg | INTRAMUSCULAR | Status: DC | PRN

## 2017-01-07 MED ORDER — BUPIVACAINE-EPINEPHRINE 0.25% -1:200000 IJ SOLN
INTRAMUSCULAR | Status: DC | PRN
  Administered 2017-01-07: 2 mL
  Administered 2017-01-07: 20 mL

## 2017-01-07 MED ORDER — DEXAMETHASONE SODIUM PHOSPHATE 10 MG/ML IJ SOLN
INTRAMUSCULAR | Status: DC | PRN
  Administered 2017-01-07: 8 mg via INTRAVENOUS

## 2017-01-07 MED ORDER — FENTANYL CITRATE (PF) 100 MCG/2ML IJ SOLN
100.0000 ug | Freq: Once | INTRAMUSCULAR | Status: AC
  Administered 2017-01-07: 100 ug via INTRAVENOUS

## 2017-01-07 MED ORDER — ONDANSETRON HCL 4 MG/2ML IJ SOLN: 4.0000 mg | Freq: Once | INTRAMUSCULAR | Status: DC | PRN

## 2017-01-07 MED ORDER — POVIDONE-IODINE 7.5 % EX SOLN
Freq: Once | CUTANEOUS | Status: DC
  Filled 2017-01-07: qty 118

## 2017-01-07 MED ORDER — METHYLENE BLUE 0.5 % INJ SOLN
INTRAVENOUS | Status: AC
  Filled 2017-01-07: qty 10

## 2017-01-07 MED ORDER — FENTANYL CITRATE (PF) 250 MCG/5ML IJ SOLN
INTRAMUSCULAR | Status: AC
  Filled 2017-01-07: qty 5

## 2017-01-07 MED ORDER — THROMBIN 20000 UNITS EX SOLR
CUTANEOUS | Status: DC | PRN
  Administered 2017-01-07: 20 mL via TOPICAL

## 2017-01-07 MED ORDER — ONDANSETRON HCL 4 MG/2ML IJ SOLN
INTRAMUSCULAR | Status: DC | PRN
  Administered 2017-01-07: 4 mg via INTRAVENOUS

## 2017-01-07 MED ORDER — BUPIVACAINE LIPOSOME 1.3 % IJ SUSP
20.0000 mL | INTRAMUSCULAR | Status: AC
  Administered 2017-01-07: 20 mL
  Filled 2017-01-07: qty 20

## 2017-01-07 MED ORDER — PROPOFOL 10 MG/ML IV BOLUS
INTRAVENOUS | Status: DC | PRN
  Administered 2017-01-07: 200 mg via INTRAVENOUS

## 2017-01-07 MED ORDER — LIDOCAINE HCL (CARDIAC) 20 MG/ML IV SOLN
INTRAVENOUS | Status: DC | PRN
  Administered 2017-01-07: 50 mg via INTRAVENOUS

## 2017-01-07 MED ORDER — MIDAZOLAM HCL 5 MG/5ML IJ SOLN
INTRAMUSCULAR | Status: DC | PRN
  Administered 2017-01-07: 2 mg via INTRAVENOUS

## 2017-01-07 MED ORDER — EPHEDRINE SULFATE 50 MG/ML IJ SOLN
INTRAMUSCULAR | Status: DC | PRN
  Administered 2017-01-07: 10 mg via INTRAVENOUS

## 2017-01-07 MED ORDER — MIDAZOLAM HCL 2 MG/2ML IJ SOLN
INTRAMUSCULAR | Status: AC
  Filled 2017-01-07: qty 2

## 2017-01-07 MED ORDER — FENTANYL CITRATE (PF) 100 MCG/2ML IJ SOLN: 25.0000 ug | INTRAMUSCULAR | Status: DC | PRN

## 2017-01-07 MED ORDER — FENTANYL CITRATE (PF) 100 MCG/2ML IJ SOLN
INTRAMUSCULAR | Status: DC | PRN
  Administered 2017-01-07 (×2): 50 ug via INTRAVENOUS
  Administered 2017-01-07: 100 ug via INTRAVENOUS
  Administered 2017-01-07 (×2): 50 ug via INTRAVENOUS

## 2017-01-07 MED ORDER — METHYLPREDNISOLONE ACETATE 40 MG/ML IJ SUSP
INTRAMUSCULAR | Status: AC
  Filled 2017-01-07: qty 1

## 2017-01-07 MED ORDER — INDIGOTINDISULFONATE SODIUM 8 MG/ML IJ SOLN
INTRAMUSCULAR | Status: DC | PRN
  Administered 2017-01-07: .03 mL via INTRAVENOUS

## 2017-01-07 MED ORDER — PHENYLEPHRINE HCL 10 MG/ML IJ SOLN
INTRAVENOUS | Status: DC | PRN
  Administered 2017-01-07: 25 ug/min via INTRAVENOUS

## 2017-01-07 MED ORDER — CEFAZOLIN SODIUM-DEXTROSE 2-4 GM/100ML-% IV SOLN
2.0000 g | INTRAVENOUS | Status: AC
  Administered 2017-01-07: 2 g via INTRAVENOUS

## 2017-01-07 MED ORDER — GLYCOPYRROLATE 0.2 MG/ML IJ SOLN
INTRAMUSCULAR | Status: DC | PRN
  Administered 2017-01-07: 0.2 mg via INTRAVENOUS

## 2017-01-07 MED ORDER — SUGAMMADEX SODIUM 200 MG/2ML IV SOLN
INTRAVENOUS | Status: DC | PRN
  Administered 2017-01-07: 200 mg via INTRAVENOUS

## 2017-01-07 SURGICAL SUPPLY — 74 items
APL SKNCLS STERI-STRIP NONHPOA (GAUZE/BANDAGES/DRESSINGS) ×1
BENZOIN TINCTURE PRP APPL 2/3 (GAUZE/BANDAGES/DRESSINGS) ×2 IMPLANT
BUR ROUND PRECISION 4.0 (BURR) ×2 IMPLANT
BUR ROUND PRECISION 4.0MM (BURR) ×1
CANISTER SUCT 3000ML PPV (MISCELLANEOUS) ×3 IMPLANT
CARTRIDGE OIL MAESTRO DRILL (MISCELLANEOUS) ×1 IMPLANT
CLOSURE WOUND 1/2 X4 (GAUZE/BANDAGES/DRESSINGS) ×1
CORDS BIPOLAR (ELECTRODE) ×3 IMPLANT
COVER SURGICAL LIGHT HANDLE (MISCELLANEOUS) ×3 IMPLANT
DIFFUSER DRILL AIR PNEUMATIC (MISCELLANEOUS) ×3 IMPLANT
DRAIN CHANNEL 15F RND FF W/TCR (WOUND CARE) IMPLANT
DRAPE POUCH INSTRU U-SHP 10X18 (DRAPES) ×6 IMPLANT
DRAPE SURG 17X23 STRL (DRAPES) ×12 IMPLANT
DURAPREP 26ML APPLICATOR (WOUND CARE) ×3 IMPLANT
ELECT BLADE 4.0 EZ CLEAN MEGAD (MISCELLANEOUS) ×3
ELECT CAUTERY BLADE 6.4 (BLADE) ×3 IMPLANT
ELECT REM PT RETURN 9FT ADLT (ELECTROSURGICAL) ×3
ELECTRODE BLDE 4.0 EZ CLN MEGD (MISCELLANEOUS) IMPLANT
ELECTRODE REM PT RTRN 9FT ADLT (ELECTROSURGICAL) ×1 IMPLANT
EVACUATOR SILICONE 100CC (DRAIN) IMPLANT
FILTER STRAW FLUID ASPIR (MISCELLANEOUS) ×3 IMPLANT
GAUZE SPONGE 4X4 12PLY STRL (GAUZE/BANDAGES/DRESSINGS) ×1 IMPLANT
GAUZE SPONGE 4X4 16PLY XRAY LF (GAUZE/BANDAGES/DRESSINGS) ×4 IMPLANT
GLOVE BIO SURGEON STRL SZ7 (GLOVE) ×5 IMPLANT
GLOVE BIO SURGEON STRL SZ8 (GLOVE) ×5 IMPLANT
GLOVE BIOGEL PI IND STRL 7.0 (GLOVE) ×1 IMPLANT
GLOVE BIOGEL PI IND STRL 8 (GLOVE) ×1 IMPLANT
GLOVE BIOGEL PI INDICATOR 7.0 (GLOVE) ×4
GLOVE BIOGEL PI INDICATOR 8 (GLOVE) ×2
GOWN STRL REUS W/ TWL LRG LVL3 (GOWN DISPOSABLE) ×1 IMPLANT
GOWN STRL REUS W/ TWL XL LVL3 (GOWN DISPOSABLE) ×2 IMPLANT
GOWN STRL REUS W/TWL LRG LVL3 (GOWN DISPOSABLE) ×12
GOWN STRL REUS W/TWL XL LVL3 (GOWN DISPOSABLE) ×6
IV CATH 14GX2 1/4 (CATHETERS) ×3 IMPLANT
KIT BASIN OR (CUSTOM PROCEDURE TRAY) ×3 IMPLANT
KIT POSITION SURG JACKSON T1 (MISCELLANEOUS) ×1 IMPLANT
KIT ROOM TURNOVER OR (KITS) ×3 IMPLANT
NDL 18GX1X1/2 (RX/OR ONLY) (NEEDLE) ×1 IMPLANT
NDL HYPO 25GX1X1/2 BEV (NEEDLE) ×1 IMPLANT
NDL SPNL 18GX3.5 QUINCKE PK (NEEDLE) ×2 IMPLANT
NEEDLE 18GX1X1/2 (RX/OR ONLY) (NEEDLE) ×3 IMPLANT
NEEDLE 22X1 1/2 (OR ONLY) (NEEDLE) ×3 IMPLANT
NEEDLE HYPO 25GX1X1/2 BEV (NEEDLE) ×3 IMPLANT
NEEDLE SPNL 18GX3.5 QUINCKE PK (NEEDLE) ×6 IMPLANT
NS IRRIG 1000ML POUR BTL (IV SOLUTION) ×3 IMPLANT
OIL CARTRIDGE MAESTRO DRILL (MISCELLANEOUS) ×3
PACK LAMINECTOMY ORTHO (CUSTOM PROCEDURE TRAY) ×3 IMPLANT
PACK UNIVERSAL I (CUSTOM PROCEDURE TRAY) ×3 IMPLANT
PAD ARMBOARD 7.5X6 YLW CONV (MISCELLANEOUS) ×6 IMPLANT
PATTIES SURGICAL .5 X.5 (GAUZE/BANDAGES/DRESSINGS) ×2 IMPLANT
PATTIES SURGICAL .5 X1 (DISPOSABLE) ×3 IMPLANT
SPONGE INTESTINAL PEANUT (DISPOSABLE) ×3 IMPLANT
SPONGE SURGIFOAM ABS GEL 100 (HEMOSTASIS) ×3 IMPLANT
SPONGE SURGIFOAM ABS GEL SZ50 (HEMOSTASIS) ×3 IMPLANT
STRIP CLOSURE SKIN 1/2X4 (GAUZE/BANDAGES/DRESSINGS) ×1 IMPLANT
SURGIFLO W/THROMBIN 8M KIT (HEMOSTASIS) IMPLANT
SUT MNCRL AB 4-0 PS2 18 (SUTURE) ×3 IMPLANT
SUT VIC AB 0 CT1 18XCR BRD 8 (SUTURE) IMPLANT
SUT VIC AB 0 CT1 27 (SUTURE) ×3
SUT VIC AB 0 CT1 27XBRD ANBCTR (SUTURE) IMPLANT
SUT VIC AB 0 CT1 8-18 (SUTURE) ×3
SUT VIC AB 1 CT1 18XCR BRD 8 (SUTURE) ×1 IMPLANT
SUT VIC AB 1 CT1 8-18 (SUTURE) ×3
SUT VIC AB 2-0 CT2 18 VCP726D (SUTURE) ×3 IMPLANT
SYR 20CC LL (SYRINGE) ×2 IMPLANT
SYR BULB IRRIGATION 50ML (SYRINGE) ×3 IMPLANT
SYR CONTROL 10ML LL (SYRINGE) ×8 IMPLANT
SYR TB 1ML 26GX3/8 SAFETY (SYRINGE) ×6 IMPLANT
SYR TB 1ML LUER SLIP (SYRINGE) ×6 IMPLANT
TAPE CLOTH SURG 4X10 WHT LF (GAUZE/BANDAGES/DRESSINGS) ×2 IMPLANT
TOWEL OR 17X24 6PK STRL BLUE (TOWEL DISPOSABLE) ×3 IMPLANT
TOWEL OR 17X26 10 PK STRL BLUE (TOWEL DISPOSABLE) ×3 IMPLANT
WATER STERILE IRR 1000ML POUR (IV SOLUTION) ×3 IMPLANT
YANKAUER SUCT BULB TIP NO VENT (SUCTIONS) ×3 IMPLANT

## 2017-01-07 NOTE — H&P (Signed)
PREOPERATIVE H&P  Chief Complaint: Left leg pain  HPI: Alexis Hartman is a 51 y.o. 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 Acute bronchitis   . Allergic rhinitis, cause unspecified   . Asthma   . Atypical face pain   . Cervicalgia   . Esophageal reflux   . Headache(784.0)   . Other convulsions    pseudo seizures  2002  . Pure hypercholesterolemia    Past Surgical History:  Procedure Laterality Date  . ABDOMINAL HYSTERECTOMY    . CYSTECTOMY  01/2004   Dr. Eda PaschalGottsegen  . LAPAROSCOPIC CHOLECYSTECTOMY  05/2010   Dr. Abbey Chattersosenbower  . VESICOVAGINAL FISTULA CLOSURE W/ TAH  01/2004   Dr. Eda PaschalGottsegen   Social History   Social History  . Marital status: Married    Spouse name: N/A  . Number of children: 3  . Years of education: N/A   Occupational History  . manager [employer]   Social History Main Topics  . Smoking status: Never Smoker  . Smokeless tobacco: Never Used  . Alcohol use No  . Drug use: No  . Sexual activity: Not on file   Other Topics Concern  . Not on file   Social History Narrative  . No narrative on file   Family History  Problem Relation Age of Onset  . Diabetes Father   . Hypertension Mother   . Diabetes Mother   . Alcohol abuse Mother        in remission  . Asthma Sister   . Depression Paternal Uncle   . Hyperlipidemia Maternal Grandmother    Allergies  Allergen Reactions  . No Known Allergies    Prior to Admission medications   Medication Sig Start Date End Date Taking? Authorizing Provider  acetaminophen (TYLENOL) 500 MG tablet Take 1,000 mg by mouth every 6 (six) hours as needed for moderate pain.   Yes [provider]  HYDROcodone-acetaminophen  (NORCO/VICODIN) 5-325 MG tablet Take 2 tablets by mouth every 6 (six) hours as needed. Patient taking differently: Take 1 tablet by mouth every 6 (six) hours as needed for moderate pain.  12/22/16  Yes Roxy HorsemanBrowning, Robert, PA-C  amLODipine (NORVASC) 5 MG tablet Take 1 tablet (5 mg total) by mouth daily. 01/05/17   Pincus SanesBurns, Stacy J, MD  Cholecalciferol (VITAMIN D) 2000 UNITS CAPS Take 1 capsule by mouth daily.    [provider]  Linoleic Acid-Sunflower Oil (CLA PO) Take 1 tablet by mouth daily.    [provider]  methocarbamol (ROBAXIN) 500 MG tablet Take 500 mg by mouth 4 (four) times daily.    [provider]  methocarbamol (ROBAXIN) 500 MG tablet Take 500 mg by mouth every 8 (eight) hours as needed for muscle spasms.    [provider]  Multiple Vitamin (MULTIVITAMIN) tablet Take 1 tablet by mouth daily.      [provider]  naproxen (NAPROSYN) 500 MG tablet Take 500 mg by mouth 2 (two) times daily as needed for mild pain.  10/13/16   [provider]  UNABLE TO FIND Take 1 tablet by mouth daily. [employer] Chiropodistat Burner    [provider]     All other  systems have been reviewed and were otherwise negative with the exception of those mentioned in the HPI and as above.  Physical Exam: There were no vitals filed for this visit.  General: Alert, no acute distress Cardiovascular: No pedal edema Respiratory: No cyanosis, no use of accessory musculature Skin: No lesions in the area of chief complaint Neurologic: Sensation intact distally Psychiatric: Patient is competent for consent with normal mood and affect Lymphatic: No axillary or cervical lymphadenopathy  MUSCULOSKELETAL: + SLR on the left  Assessment/Plan: Left leg pain Plan for Procedure(s): LEFT SIDED LUMBAR 5-SACRUM 1 MICRODISCECTOMY   Emilee Hero, MD 01/07/2017 7:28 AM

## 2017-01-07 NOTE — Transfer of Care (Signed)
Immediate Anesthesia Transfer of Care Note  Patient: Alexis Hartman  Procedure(s) Performed: Procedure(s) with comments: LEFT SIDED LUMBAR 5-SACRUM 1 MICRODISCECTOMY (Left) - LEFT SIDED LUMBAR 5-SACRUM 1 MICRODISCECTOMY; REQUEST 2 HOURS AND FLIP ROOM  Patient Location: PACU  Anesthesia Type:General  Level of Consciousness: awake, alert  and oriented  Airway & Oxygen Therapy: Patient connected to nasal cannula oxygen  Post-op Assessment: Post -op Vital signs reviewed and stable  Post vital signs: stable  Last Vitals:  Vitals:   01/07/17 0825 01/07/17 1458  BP: (!) 172/83   Pulse: 74   Resp: 20   Temp: 36.8 C (P) 36.6 C    Last Pain:  Vitals:   01/07/17 0852  TempSrc:   PainSc: 10-Worst pain ever         Complications: No apparent anesthesia complications

## 2017-01-07 NOTE — Anesthesia Postprocedure Evaluation (Signed)
Anesthesia Post Note  Patient: Hendricks Milondrea B Cofer  Procedure(s) Performed: Procedure(s) (LRB): LEFT SIDED LUMBAR 5-SACRUM 1 MICRODISCECTOMY (Left)     Patient location during evaluation: PACU Anesthesia Type: General Level of consciousness: awake and alert Pain management: pain level controlled Vital Signs Assessment: post-procedure vital signs reviewed and stable Respiratory status: spontaneous breathing, nonlabored ventilation, respiratory function stable and patient connected to nasal cannula oxygen Cardiovascular status: blood pressure returned to baseline and stable Postop Assessment: no signs of nausea or vomiting Anesthetic complications: no    Last Vitals:  Vitals:   01/07/17 1458 01/07/17 1513  BP: (!) 144/87 138/77  Pulse: 83 65  Resp:  19  Temp: 36.6 C     Last Pain:  Vitals:   01/07/17 1458  TempSrc:   PainSc: 0-No pain                 Talecia Sherlin

## 2017-01-07 NOTE — Anesthesia Preprocedure Evaluation (Signed)
Anesthesia Evaluation  Patient identified by MRN, date of birth, ID band Patient awake    Reviewed: Allergy & Precautions, NPO status , Patient's Chart, lab work & pertinent test results  Airway Mallampati: II  TM Distance: >3 FB Neck ROM: Full    Dental no notable dental hx.    Pulmonary neg pulmonary ROS,    Pulmonary exam normal breath sounds clear to auscultation       Cardiovascular negative cardio ROS Normal cardiovascular exam Rhythm:Regular Rate:Normal     Neuro/Psych negative neurological ROS  negative psych ROS   GI/Hepatic negative GI ROS, Neg liver ROS,   Endo/Other  negative endocrine ROS  Renal/GU negative Renal ROS  negative genitourinary   Musculoskeletal negative musculoskeletal ROS (+)   Abdominal   Peds negative pediatric ROS (+)  Hematology negative hematology ROS (+)   Anesthesia Other Findings   Reproductive/Obstetrics negative OB ROS                             Anesthesia Physical Anesthesia Plan  ASA: II  Anesthesia Plan: General   Post-op Pain Management:    Induction: Intravenous  PONV Risk Score and Plan: 3 and Ondansetron, Dexamethasone, Propofol, Midazolam and Treatment may vary due to age or medical condition  Airway Management Planned: Oral ETT  Additional Equipment:   Intra-op Plan:   Post-operative Plan: Extubation in OR  Informed Consent: I have reviewed the patients History and Physical, chart, labs and discussed the procedure including the risks, benefits and alternatives for the proposed anesthesia with the patient or authorized representative who has indicated his/her understanding and acceptance.   Dental advisory given  Plan Discussed with:   Anesthesia Plan Comments: (  )        Anesthesia Quick Evaluation  

## 2017-01-07 NOTE — Anesthesia Procedure Notes (Signed)
Procedure Name: Intubation Date/Time: 01/07/2017 12:17 PM Performed by: Lavell Luster Pre-anesthesia Checklist: Patient identified, Emergency Drugs available, Suction available, Patient being monitored and Timeout performed Patient Re-evaluated:Patient Re-evaluated prior to induction Oxygen Delivery Method: Circle system utilized Preoxygenation: Pre-oxygenation with 100% oxygen Induction Type: IV induction Ventilation: Mask ventilation without difficulty Laryngoscope Size: Mac and 3 Grade View: Grade I Tube type: Oral Tube size: 7.5 mm Number of attempts: 1 Airway Equipment and Method: Stylet Placement Confirmation: ETT inserted through vocal cords under direct vision,  breath sounds checked- equal and bilateral and positive ETCO2 Secured at: 22 cm Tube secured with: Tape Dental Injury: Teeth and Oropharynx as per pre-operative assessment

## 2017-01-07 NOTE — Op Note (Deleted)
  The note originally documented on this encounter has been moved the the encounter in which it belongs.  

## 2017-01-07 NOTE — Anesthesia Postprocedure Evaluation (Signed)
Anesthesia Post Note  Patient: Alexis Hartman  Procedure(s) Performed: Procedure(s) (LRB): LEFT SIDED LUMBAR 5-SACRUM 1 MICRODISCECTOMY (Left)     Anesthesia Type: General    Last Vitals:  Vitals:   01/07/17 1458 01/07/17 1513  BP: (!) 144/87 138/77  Pulse: 83 65  Resp:  19  Temp: 36.6 C     Last Pain:  Vitals:   01/07/17 1458  TempSrc:   PainSc: 0-No pain                 Elainah Rhyne

## 2017-01-07 NOTE — Op Note (Signed)
NAME:  [name], Alexis Hartman              ACCOUNT NO.:  [id number]  MEDICAL RECORD NO.:  [id number]  PHYSICIAN:  Estill BambergMark Friend Dorfman, MD      DATE OF BIRTH:  [birth year]  DATE OF PROCEDURE:  01/07/2017                              OPERATIVE REPORT   PREOPERATIVE DIAGNOSES: 1. Left-sided S1 radiculopathy. 2. Left-sided L5-S1 disk herniation.  POSTOPERATIVE DIAGNOSES: 1. Left-sided S1 radiculopathy. 2. Left-sided L5-S1 disk herniation.  PROCEDURES:  Left-sided L5-S1 microdiskectomy, including laminotomy and partial facetectomy.  SURGEON:  Estill BambergMark Arnett Galindez, M.D.  ASSISTANT:  Jason CoopKayla McKenzie, PA-C.  ANESTHESIA:  General endotracheal anesthesia.  COMPLICATIONS:  None.  DISPOSITION:  Stable.  ESTIMATED BLOOD LOSS:  Minimal.  INDICATIONS FOR SURGERY:  Briefly, Ms. Alexis Hartman is a pleasant [age] year old female, who did present to me on December 26, 2016, with severe debilitating 10/10 pain in her left leg.  She states that the pain had been present for over a year, but did get worse just a few months prior.  The pain mainly involve the posterior lateral thigh and the calf on the left side.  I did review an MRI, which was notable for a moderate left-sided L5-S1 disk herniation, compressing the left S1 nerve.  Given the findings on her MRI and lack of improvement with appropriate measures, I did discuss with her proceeding with the procedure outlined above.  The patient was fully aware of the risks and limitations of surgery and did elect to proceed.  DESCRIPTION OF PROCEDURE:  On January 07, 2017, the patient was brought to surgery and general endotracheal anesthesia was administered.  The patient was placed prone on a well-padded flat Jackson bed with a spinal frame.  Antibiotics were given and a time-out procedure was performed. The back was prepped and draped in the usual sterile fashion.  A midline incision was made overlying the L5-S1 intervertebral space.  The fascia was incised just to the left of  the midline.  A self-retaining retractor was placed.  At this point, I did proceed with a left-sided partial facetectomy and laminotomy.  The ligamentum flavum at its lateral aspect was removed.  The traversing left S1 nerve was identified and medially retracted.  Immediately ventral to the nerve was noted to be a prominence of the intervertebral disk.  I did use a micro nerve hook to tease away the very superficial layers overlying the intervertebral disk space.  Immediately beneath the superficial annulus was noted to be herniated disk fragments, which were removed uneventfully in multiple fragments.  Of note, I did not violate the intervertebral space at any point.  I did use a nerve hook to explore the region of the herniated fragments, in order to confirm that all herniated disk fragments were removed, and there were no additional fragments encountered.  I then copiously irrigated the wound.  I did evaluate the left S1 nerve, and it was noted to be free of any compression or tension.  I then introduced 20 mg of Depo-Medrol into the epidural space surrounding the region of the left S1 nerve.  The wound was then closed in layers using #1 Vicryl, followed by 2-0 Vicryl, followed by 4-0 Monocryl.  Benzoin and Steri- Strips were applied followed by sterile dressing.  All instrument counts were correct at the termination of the procedure.  Of note, Jason CoopKayla McKenzie  was my assistant throughout surgery, and did aid in retraction, suctioning, and closure.     Estill Bamberg, MD   ______________________________ Estill Bamberg, MD    MD/MEDQ  D:  01/07/2017  T:  01/07/2017  Job:  [id number]  cc:   Eileen Stanford, M.D.

## 2017-01-08 ENCOUNTER — Encounter (HOSPITAL_COMMUNITY): Payer: Self-pay | Admitting: Orthopedic Surgery

## 2017-03-09 DIAGNOSIS — M545 Low back pain: Secondary | ICD-10-CM | POA: Diagnosis not present

## 2017-03-09 DIAGNOSIS — M5416 Radiculopathy, lumbar region: Secondary | ICD-10-CM | POA: Diagnosis not present

## 2017-03-11 DIAGNOSIS — M5416 Radiculopathy, lumbar region: Secondary | ICD-10-CM | POA: Diagnosis not present

## 2017-03-11 DIAGNOSIS — M545 Low back pain: Secondary | ICD-10-CM | POA: Diagnosis not present

## 2017-03-16 DIAGNOSIS — M545 Low back pain: Secondary | ICD-10-CM | POA: Diagnosis not present

## 2017-03-16 DIAGNOSIS — M5416 Radiculopathy, lumbar region: Secondary | ICD-10-CM | POA: Diagnosis not present

## 2017-03-18 DIAGNOSIS — M545 Low back pain: Secondary | ICD-10-CM | POA: Diagnosis not present

## 2017-03-18 DIAGNOSIS — M5416 Radiculopathy, lumbar region: Secondary | ICD-10-CM | POA: Diagnosis not present

## 2017-03-23 DIAGNOSIS — M545 Low back pain: Secondary | ICD-10-CM | POA: Diagnosis not present

## 2017-03-23 DIAGNOSIS — M5416 Radiculopathy, lumbar region: Secondary | ICD-10-CM | POA: Diagnosis not present

## 2017-03-25 DIAGNOSIS — M545 Low back pain: Secondary | ICD-10-CM | POA: Diagnosis not present

## 2017-03-25 DIAGNOSIS — M5416 Radiculopathy, lumbar region: Secondary | ICD-10-CM | POA: Diagnosis not present

## 2017-04-01 DIAGNOSIS — M545 Low back pain: Secondary | ICD-10-CM | POA: Diagnosis not present

## 2017-04-01 DIAGNOSIS — M5416 Radiculopathy, lumbar region: Secondary | ICD-10-CM | POA: Diagnosis not present

## 2017-04-07 DIAGNOSIS — M5416 Radiculopathy, lumbar region: Secondary | ICD-10-CM | POA: Diagnosis not present

## 2017-04-07 DIAGNOSIS — M545 Low back pain: Secondary | ICD-10-CM | POA: Diagnosis not present

## 2017-04-10 DIAGNOSIS — M5416 Radiculopathy, lumbar region: Secondary | ICD-10-CM | POA: Diagnosis not present

## 2017-04-10 DIAGNOSIS — M545 Low back pain: Secondary | ICD-10-CM | POA: Diagnosis not present

## 2017-04-14 DIAGNOSIS — M545 Low back pain: Secondary | ICD-10-CM | POA: Diagnosis not present

## 2017-04-14 DIAGNOSIS — M5416 Radiculopathy, lumbar region: Secondary | ICD-10-CM | POA: Diagnosis not present

## 2017-04-17 DIAGNOSIS — M5416 Radiculopathy, lumbar region: Secondary | ICD-10-CM | POA: Diagnosis not present

## 2017-04-17 DIAGNOSIS — M545 Low back pain: Secondary | ICD-10-CM | POA: Diagnosis not present

## 2017-04-27 DIAGNOSIS — M5416 Radiculopathy, lumbar region: Secondary | ICD-10-CM | POA: Diagnosis not present

## 2017-04-27 DIAGNOSIS — M545 Low back pain: Secondary | ICD-10-CM | POA: Diagnosis not present

## 2017-04-29 DIAGNOSIS — M545 Low back pain: Secondary | ICD-10-CM | POA: Diagnosis not present

## 2017-04-29 DIAGNOSIS — M5416 Radiculopathy, lumbar region: Secondary | ICD-10-CM | POA: Diagnosis not present

## 2017-05-12 DIAGNOSIS — M545 Low back pain: Secondary | ICD-10-CM | POA: Diagnosis not present

## 2017-05-12 DIAGNOSIS — M5416 Radiculopathy, lumbar region: Secondary | ICD-10-CM | POA: Diagnosis not present

## 2017-08-09 DIAGNOSIS — J3089 Other allergic rhinitis: Secondary | ICD-10-CM | POA: Diagnosis not present

## 2017-08-09 DIAGNOSIS — S93402A Sprain of unspecified ligament of left ankle, initial encounter: Secondary | ICD-10-CM | POA: Diagnosis not present

## 2017-12-01 DIAGNOSIS — I10 Essential (primary) hypertension: Secondary | ICD-10-CM | POA: Insufficient documentation

## 2017-12-01 NOTE — Progress Notes (Signed)
Subjective:    Patient ID: Alexis Hartman, female    DOB: 04-May-1966, 52 y.o.   MRN: [id number]  HPI She is here for a physical exam.   She exercises 6/week.  She eats healthy and is compliant with a low sodium diet.  She does have a family history of diabetes, hypertension and hyperlipidemia.  She has been checking her sugars intermittently and they have been 131, 109, 102 - mostly 69-124.  She does not check her BP at home.  She does have a blood pressure cuff and can start checking it.  She does have a strong family history of hypertension.  Medications and allergies reviewed with patient and updated if appropriate.  Patient Active Problem List   Diagnosis Date Noted  . Diabetes (HCC) 12/02/2017  . Hypertension 12/01/2017  . Asthma 11/28/2016  . 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 Family history of diabetes mellitus (DM) 11/28/2016  . Chest tightness 11/28/2016  . 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 HYPERCHOLESTEROLEMIA 08/18/2010  . NECK PAIN, RIGHT 10/13/2007  . ALLERGIC RHINITIS 06/30/2007    Current Outpatient Medications on File Prior to Visit  Medication Sig Dispense Refill  . acetaminophen (TYLENOL) 500 MG tablet Take 1,000 mg by mouth every 6 (six) hours as needed for moderate pain.    . Cholecalciferol (VITAMIN D) 2000 UNITS CAPS Take 1 capsule by mouth daily.    . Multiple Vitamin (MULTIVITAMIN) tablet Take 1 tablet by mouth daily.      Marland Kitchen UNABLE TO FIND Take 1 tablet by mouth daily. It Works Lexicographer     No current facility-administered medications on file prior to visit.     Past Medical History:  Diagnosis Date  . Acute bronchitis   . Allergic rhinitis, cause unspecified   . Asthma   . Atypical face pain   . Cervicalgia   . Esophageal reflux   . Headache(784.0)   . Other convulsions    pseudo seizures  2002  . Pure hypercholesterolemia     Past Surgical History:  Procedure Laterality Date  . ABDOMINAL HYSTERECTOMY    .  CYSTECTOMY  01/2004   Dr. Eda Paschal  . LAPAROSCOPIC CHOLECYSTECTOMY  05/2010   Dr. Abbey Chatters  . LUMBAR LAMINECTOMY/DECOMPRESSION MICRODISCECTOMY Left 01/07/2017   Procedure: LEFT SIDED LUMBAR 5-SACRUM 1 MICRODISCECTOMY;  Surgeon: Estill Bamberg, MD;  Location: [facility] OR;  Service: Orthopedics;  Laterality: Left;  LEFT SIDED LUMBAR 5-SACRUM 1 MICRODISCECTOMY; REQUEST 2 HOURS AND FLIP ROOM  . VESICOVAGINAL FISTULA CLOSURE W/ TAH  01/2004   Dr. Eda Paschal    Social History   Socioeconomic History  . Marital status: Married    Spouse name: Not on file  . Number of children: 3  . Years of education: Not on file  . Highest education level: Not on file  Occupational History  . Occupation: Event organiser: [employer]  Social Needs  . Financial resource strain: Not on file  . Food insecurity:    Worry: Not on file    Inability: Not on file  . Transportation needs:    Medical: Not on file    Non-medical: Not on file  Tobacco Use  . Smoking status: Never Smoker  . Smokeless tobacco: Never Used  Substance and Sexual Activity  . Alcohol use: No    Alcohol/week: 0.0 oz  . Drug use: No  . Sexual activity: Not on file  Lifestyle  . Physical activity:    Days per week: Not on  file    Minutes per session: Not on file  . Stress: Not on file  Relationships  . Social connections:    Talks on phone: Not on file    Gets together: Not on file    Attends religious service: Not on file    Active member of club or organization: Not on file    Attends meetings of clubs or organizations: Not on file    Relationship status: Not on file  Other Topics Concern  . Not on file  Social History Narrative  . Not on file    Family History  Problem Relation Age of Onset  . Diabetes Father   . Hypertension Mother   . Diabetes Mother   . Alcohol abuse Mother        in remission  . Asthma Sister   . Depression Paternal Uncle   . Hyperlipidemia Maternal Grandmother     Review of Systems    Constitutional: Negative for chills and fever.  Eyes: Negative for visual disturbance (blurry at times - needs new glasses).  Respiratory: Positive for shortness of breath (mild lately - thinks it is from congestion - can exercise intensely w/o difficulty). Negative for cough and wheezing.   Cardiovascular: Positive for palpitations (rare) and leg swelling (mild swelling from twisting ankle). Negative for chest pain.  Gastrointestinal: Positive for abdominal pain (mild intermittent LLQ discomfort x few days). Negative for blood in stool, constipation, diarrhea and nausea.  Genitourinary: Negative for dysuria and hematuria.  Musculoskeletal: Positive for arthralgias.  Skin: Negative for color change and rash.  Neurological: Positive for headaches (occasional). Negative for light-headedness.  Psychiatric/Behavioral: Negative for dysphoric mood. The patient is not nervous/anxious.        Objective:   Vitals:   12/02/17 0928  BP: (!) 142/94  Pulse: 60  Resp: 16  Temp: 98.5 F (36.9 C)  SpO2: 97%   Filed Weights   12/02/17 0928  Weight: 198 lb (89.8 kg)   Body mass index is 28.41 kg/m.  BP Readings from Last 3 Encounters:  12/02/17 (!) 142/94  01/07/17 139/84  01/05/17 (!) 202/114    Wt Readings from Last 3 Encounters:  12/02/17 198 lb (89.8 kg)  01/07/17 192 lb (87.1 kg)  01/05/17 192 lb 7.4 oz (87.3 kg)     Physical Exam Constitutional: She appears well-developed and well-nourished. No distress.  HENT:  Head: Normocephalic and atraumatic.  Right Ear: External ear normal. Normal ear canal and TM Left Ear: External ear normal.  Normal ear canal and TM Mouth/Throat: Oropharynx is clear and moist.  Eyes: Conjunctivae and EOM are normal.  Neck: Neck supple. No tracheal deviation present. No thyromegaly present.  No carotid bruit  Cardiovascular: Normal rate, regular rhythm and normal heart sounds.   No murmur heard.  No edema. Pulmonary/Chest: Effort normal and breath  sounds normal. No respiratory distress. She has no wheezes. She has no rales.  Breast: deferred to Gyn Abdominal: Soft. She exhibits no distension. There is no tenderness.  Lymphadenopathy: She has no cervical adenopathy.  Skin: Skin is warm and dry. She is not diaphoretic.  Psychiatric: She has a normal mood and affect. Her behavior is normal.        Assessment & Plan:   Physical exam: Screening blood work  ordered Immunizations   Discussed shingrix,  Other up to date Colonoscopy  - never had one - will refer Mammogram  - due - will do it in the fall Gyn  - has  not been in a while - s/p partial hysterectomy - will schedule Eye exams  Up to date  EKG    done 12/2016 Exercise  Regular - 6 days a week - Similar to a boot camp Weight   weight is stable-could lose some weight, but has a more muscular body frame Skin   no concerns Substance abuse   none  See Problem List for Assessment and Plan of chronic medical problems.    Follow-up in 6 months

## 2017-12-01 NOTE — Patient Instructions (Addendum)
Goal BP < 130/80  Goal fasting sugars - , 126.  Sugars -Normal < 100      Prediabetic   100-125      Diabetic    126 +      2 hours after a meal < [place]  [place], [place] [address]  Main: [place] [address] [place],  [id number] [phone]   [facility] [place] [facility] [address] [place], [place] Across the street from [facility] [phone]    Test(s) ordered today. Your results will be released to [facility] (or called to you) after review, usually within 72hours after test completion. If any changes need to be made, you will be notified at that same time.  All other Health Maintenance issues reviewed.   All recommended immunizations and age-appropriate screenings are up-to-date or discussed.  No immunizations administered today.   Medications reviewed and updated.  No changes recommended at this time.  Your prescription(s) have been submitted to your pharmacy. Please take as directed and contact our office if you believe you are having problem(s) with the medication(s).  A referral was ordered for GI for a colonoscopy  Please followup in 6 months  Health Maintenance, Female Adopting a healthy lifestyle and getting preventive care can go a long way to promote health and wellness. Talk with your health care provider about what schedule of regular examinations is right for you. This is a good chance for you to check in with your provider about disease prevention and staying healthy. In between checkups, there are plenty of things you can do on your own. Experts have done a lot of research about which lifestyle changes and preventive measures are most likely to keep you healthy. Ask your health care provider for more information. Weight and diet Eat a healthy diet  Be sure to  include plenty of vegetables, fruits, low-fat dairy products, and lean protein.  Do not eat a lot of foods high in solid fats, added sugars, or salt.  Get regular exercise. This is one of the most important things you can do for your health. ? Most adults should exercise for at least 150 minutes each week. The exercise should increase your heart rate and make you sweat (moderate-intensity exercise). ? Most adults should also do strengthening exercises at least twice a week. This is in addition to the moderate-intensity exercise.  Maintain a healthy weight  Body mass index (BMI) is a measurement that can be used to identify possible weight problems. It estimates body fat based on height and weight. Your health care provider can help determine your BMI and help you achieve or maintain a healthy weight.  For females 18 years of age and older: ? A BMI below 18.5 is considered underweight. ? A BMI of 18.5 to 24.9 is normal. ? A BMI of 25 to 29.9 is considered overweight. ? A BMI of 30 and above is considered obese.  Watch levels of cholesterol and blood lipids  You should start having your blood tested for lipids and cholesterol at 52 years of age, then have this test every 5 years.  You may need to have your cholesterol levels checked more often if: ? Your lipid or cholesterol levels are high. ? You are older than 52 years of age. ? You are at high risk for heart disease.  Cancer screening Lung Cancer  Lung cancer screening is recommended for adults 40-44 years old who are at high risk for lung cancer because of a history of smoking.  A yearly low-dose CT scan of the lungs is recommended for people who: ? Currently smoke. ? Have quit within the past 15 years. ? Have at least a 30-pack-year history of smoking. A pack year is smoking an average of one pack of cigarettes a day for 1 year.  Yearly screening should continue until it has been 15 years since you quit.  Yearly screening  should stop if you develop a health problem that would prevent you from having lung cancer treatment.  Breast Cancer  Practice breast self-awareness. This means understanding how your breasts normally appear and feel.  It also means doing regular breast self-exams. Let your health care provider know about any changes, no matter how small.  If you are in your 20s or 30s, you should have a clinical breast exam (CBE) by a health care provider every 1-3 years as part of a regular health exam.  If you are 9 or older, have a CBE every year. Also consider having a breast X-ray (mammogram) every year.  If you have a family history of breast cancer, talk to your health care provider about genetic screening.  If you are at high risk for breast cancer, talk to your health care provider about having an MRI and a mammogram every year.  Breast cancer gene (BRCA) assessment is recommended for women who have family members with BRCA-related cancers. BRCA-related cancers include: ? Breast. ? Ovarian. ? Tubal. ? Peritoneal cancers.  Results of the assessment will determine the need for genetic counseling and BRCA1 and BRCA2 testing.  Cervical Cancer Your health care provider may recommend that you be screened regularly for cancer of the pelvic organs (ovaries, uterus, and vagina). This screening involves a pelvic examination, including checking for microscopic changes to the surface of your cervix (Pap test). You may be encouraged to have this screening done every 3 years, beginning at age 18.  For women ages 65-65, health care providers may recommend pelvic exams and Pap testing every 3 years, or they may recommend the Pap and pelvic exam, combined with testing for human papilloma virus (HPV), every 5 years. Some types of HPV increase your risk of cervical cancer. Testing for HPV may also be done on women of any age with unclear Pap test results.  Other health care providers may not recommend any  screening for nonpregnant women who are considered low risk for pelvic cancer and who do not have symptoms. Ask your health care provider if a screening pelvic exam is right for you.  If you have had past treatment for cervical cancer or a condition that could lead to cancer, you need Pap tests and screening for cancer for at least 20 years after your treatment. If Pap tests have been discontinued, your risk factors (such as having a new sexual partner) need to be reassessed to determine if screening should resume. Some women have medical problems that increase the chance of getting cervical cancer. In these cases, your health care provider may recommend more frequent screening and Pap tests.  Colorectal Cancer  This type of cancer can be detected and often prevented.  Routine colorectal cancer screening usually begins at 52 years of age and continues through 52 years of age.  Your health care provider may recommend screening at an earlier age if you have risk factors  for colon cancer.  Your health care provider may also recommend using home test kits to check for hidden blood in the stool.  A small camera at the end of a tube can be used to examine your colon directly (sigmoidoscopy or colonoscopy). This is done to check for the earliest forms of colorectal cancer.  Routine screening usually begins at age 11.  Direct examination of the colon should be repeated every 5-10 years through 52 years of age. However, you may need to be screened more often if early forms of precancerous polyps or small growths are found.  Skin Cancer  Check your skin from head to toe regularly.  Tell your health care provider about any new moles or changes in moles, especially if there is a change in a mole's shape or color.  Also tell your health care provider if you have a mole that is larger than the size of a pencil eraser.  Always use sunscreen. Apply sunscreen liberally and repeatedly throughout the  day.  Protect yourself by wearing long sleeves, pants, a wide-brimmed hat, and sunglasses whenever you are outside.  Heart disease, diabetes, and high blood pressure  High blood pressure causes heart disease and increases the risk of stroke. High blood pressure is more likely to develop in: ? People who have blood pressure in the high end of the normal range (130-139/85-89 mm Hg). ? People who are overweight or obese. ? People who are African American.  If you are 58-58 years of age, have your blood pressure checked every 3-5 years. If you are 15 years of age or older, have your blood pressure checked every year. You should have your blood pressure measured twice-once when you are at a hospital or clinic, and once when you are not at a hospital or clinic. Record the average of the two measurements. To check your blood pressure when you are not at a hospital or clinic, you can use: ? An automated blood pressure machine at a pharmacy. ? A home blood pressure monitor.  If you are between 67 years and 18 years old, ask your health care provider if you should take aspirin to prevent strokes.  Have regular diabetes screenings. This involves taking a blood sample to check your fasting blood sugar level. ? If you are at a normal weight and have a low risk for diabetes, have this test once every three years after 52 years of age. ? If you are overweight and have a high risk for diabetes, consider being tested at a younger age or more often. Preventing infection Hepatitis B  If you have a higher risk for hepatitis B, you should be screened for this virus. You are considered at high risk for hepatitis B if: ? You were born in a country where hepatitis B is common. Ask your health care provider which countries are considered high risk. ? Your parents were born in a high-risk country, and you have not been immunized against hepatitis B (hepatitis B vaccine). ? You have HIV or AIDS. ? You use needles to  inject street drugs. ? You live with someone who has hepatitis B. ? You have had sex with someone who has hepatitis B. ? You get hemodialysis treatment. ? You take certain medicines for conditions, including cancer, organ transplantation, and autoimmune conditions.  Hepatitis C  Blood testing is recommended for: ? Everyone born from [birth year] through 1965. ? Anyone with known risk factors for hepatitis C.  Sexually transmitted infections (STIs)  You should be screened for sexually transmitted infections (STIs) including gonorrhea and chlamydia if: ? You are sexually active and are younger than 52 years of age. ? You are older than 52 years of age and your health care provider tells you that you are at risk for this type of infection. ? Your sexual activity has changed since you were last screened and you are at an increased risk for chlamydia or gonorrhea. Ask your health care provider if you are at risk.  If you do not have HIV, but are at risk, it may be recommended that you take a prescription medicine daily to prevent HIV infection. This is called pre-exposure prophylaxis (PrEP). You are considered at risk if: ? You are sexually active and do not regularly use condoms or know the HIV status of your partner(s). ? You take drugs by injection. ? You are sexually active with a partner who has HIV.  Talk with your health care provider about whether you are at high risk of being infected with HIV. If you choose to begin PrEP, you should first be tested for HIV. You should then be tested every 3 months for as long as you are taking PrEP. Pregnancy  If you are premenopausal and you may become pregnant, ask your health care provider about preconception counseling.  If you may become pregnant, take 400 to 800 micrograms (mcg) of folic acid every day.  If you want to prevent pregnancy, talk to your health care provider about birth control (contraception). Osteoporosis and  menopause  Osteoporosis is a disease in which the bones lose minerals and strength with aging. This can result in serious bone fractures. Your risk for osteoporosis can be identified using a bone density scan.  If you are 49 years of age or older, or if you are at risk for osteoporosis and fractures, ask your health care provider if you should be screened.  Ask your health care provider whether you should take a calcium or vitamin D supplement to lower your risk for osteoporosis.  Menopause may have certain physical symptoms and risks.  Hormone replacement therapy may reduce some of these symptoms and risks. Talk to your health care provider about whether hormone replacement therapy is right for you. Follow these instructions at home:  Schedule regular health, dental, and eye exams.  Stay current with your immunizations.  Do not use any tobacco products including cigarettes, chewing tobacco, or electronic cigarettes.  If you are pregnant, do not drink alcohol.  If you are breastfeeding, limit how much and how often you drink alcohol.  Limit alcohol intake to no more than 1 drink per day for nonpregnant women. One drink equals 12 ounces of beer, 5 ounces of wine, or 1 ounces of hard liquor.  Do not use street drugs.  Do not share needles.  Ask your health care provider for help if you need support or information about quitting drugs.  Tell your health care provider if you often feel depressed.  Tell your health care provider if you have ever been abused or do not feel safe at home. This information is not intended to replace advice given to you by your health care provider. Make sure you discuss any questions you have with your health care provider. Document Released: 12/30/2010 Document Revised: 11/22/2015 Document Reviewed: 03/20/2015 Elsevier Interactive Patient Education  [employer].

## 2017-12-02 ENCOUNTER — Encounter: Payer: Self-pay | Admitting: Internal Medicine

## 2017-12-02 ENCOUNTER — Other Ambulatory Visit (INDEPENDENT_AMBULATORY_CARE_PROVIDER_SITE_OTHER): Payer: 59

## 2017-12-02 ENCOUNTER — Ambulatory Visit (INDEPENDENT_AMBULATORY_CARE_PROVIDER_SITE_OTHER): Payer: 59 | Admitting: Internal Medicine

## 2017-12-02 VITALS — BP 142/94 | HR 60 | Temp 98.5°F | Resp 16 | Ht 70.0 in | Wt 198.0 lb

## 2017-12-02 DIAGNOSIS — Z0000 Encounter for general adult medical examination without abnormal findings: Secondary | ICD-10-CM

## 2017-12-02 DIAGNOSIS — E119 Type 2 diabetes mellitus without complications: Secondary | ICD-10-CM

## 2017-12-02 DIAGNOSIS — I10 Essential (primary) hypertension: Secondary | ICD-10-CM | POA: Diagnosis not present

## 2017-12-02 DIAGNOSIS — Z1211 Encounter for screening for malignant neoplasm of colon: Secondary | ICD-10-CM

## 2017-12-02 DIAGNOSIS — E7800 Pure hypercholesterolemia, unspecified: Secondary | ICD-10-CM

## 2017-12-02 DIAGNOSIS — E1169 Type 2 diabetes mellitus with other specified complication: Secondary | ICD-10-CM | POA: Insufficient documentation

## 2017-12-02 LAB — CBC WITH DIFFERENTIAL/PLATELET
Basophils Absolute: 0.1 10*3/uL (ref 0.0–0.1)
Basophils Relative: 0.8 % (ref 0.0–3.0)
EOS ABS: 0 10*3/uL (ref 0.0–0.7)
Eosinophils Relative: 0.3 % (ref 0.0–5.0)
HCT: 39.4 % (ref 36.0–46.0)
HEMOGLOBIN: 13.4 g/dL (ref 12.0–15.0)
Lymphocytes Relative: 37.8 % (ref 12.0–46.0)
Lymphs Abs: 3.2 10*3/uL (ref 0.7–4.0)
MCHC: 34.1 g/dL (ref 30.0–36.0)
MCV: 86.1 fl (ref 78.0–100.0)
MONO ABS: 0.4 10*3/uL (ref 0.1–1.0)
Monocytes Relative: 4.4 % (ref 3.0–12.0)
Neutro Abs: 4.8 10*3/uL (ref 1.4–7.7)
Neutrophils Relative %: 56.7 % (ref 43.0–77.0)
Platelets: 230 10*3/uL (ref 150.0–400.0)
RBC: 4.58 Mil/uL (ref 3.87–5.11)
RDW: 13.9 % (ref 11.5–15.5)
WBC: 8.5 10*3/uL (ref 4.0–10.5)

## 2017-12-02 LAB — COMPREHENSIVE METABOLIC PANEL
ALBUMIN: 4.8 g/dL (ref 3.5–5.2)
ALK PHOS: 61 U/L (ref 39–117)
ALT: 37 U/L — ABNORMAL HIGH (ref 0–35)
AST: 58 U/L — ABNORMAL HIGH (ref 0–37)
BILIRUBIN TOTAL: 0.6 mg/dL (ref 0.2–1.2)
BUN: 14 mg/dL (ref 6–23)
CO2: 28 mEq/L (ref 19–32)
Calcium: 10.2 mg/dL (ref 8.4–10.5)
Chloride: 102 mEq/L (ref 96–112)
Creatinine, Ser: 0.93 mg/dL (ref 0.40–1.20)
GFR: 81.38 mL/min (ref >60.00)
Glucose, Bld: 94 mg/dL (ref 70–99)
Potassium: 3.8 mEq/L (ref 3.5–5.1)
SODIUM: 139 meq/L (ref 135–145)
TOTAL PROTEIN: 7.8 g/dL (ref 6.0–8.3)

## 2017-12-02 LAB — TSH: TSH: 0.88 u[IU]/mL (ref 0.35–4.50)

## 2017-12-02 LAB — LIPID PANEL
CHOLESTEROL: 229 mg/dL — AB (ref 0–200)
HDL: 71.8 mg/dL (ref >39.00)
LDL Cholesterol: 145 mg/dL — ABNORMAL HIGH (ref 0–99)
NonHDL: 157.59
Total CHOL/HDL Ratio: 3
Triglycerides: 65 mg/dL (ref 0.0–149.0)
VLDL: 13 mg/dL (ref 0.0–40.0)

## 2017-12-02 LAB — HEMOGLOBIN A1C: HEMOGLOBIN A1C: 6.3 % (ref 4.6–6.5)

## 2017-12-02 NOTE — Assessment & Plan Note (Signed)
Has not tolerated statins in the past Has a strong history of hyperlipidemia Will recheck lipid panel, CMP, TSH Continue regular exercise and healthy diet

## 2017-12-02 NOTE — Assessment & Plan Note (Signed)
Blood pressure elevated here today and has been higher borderline high in the past Strong family history of hypertension Continue healthy diet and regular exercise She will start monitoring her blood pressure at home Discussed that if she needs medication we should start it  CMP, TSH

## 2017-12-02 NOTE — Assessment & Plan Note (Signed)
Last A1c was in diabetic range She is checking her sugars at home intermittently they have been mostly in the prediabetic range Continue healthy diet and regular exercise Strong family history of diabetes Check A1c

## 2017-12-04 ENCOUNTER — Encounter: Payer: Self-pay | Admitting: Internal Medicine

## 2018-01-21 ENCOUNTER — Encounter: Payer: Self-pay | Admitting: Gastroenterology

## 2018-03-05 ENCOUNTER — Ambulatory Visit (AMBULATORY_SURGERY_CENTER): Payer: Self-pay | Admitting: *Deleted

## 2018-03-05 VITALS — Ht 70.0 in | Wt 202.0 lb

## 2018-03-05 DIAGNOSIS — Z1211 Encounter for screening for malignant neoplasm of colon: Secondary | ICD-10-CM

## 2018-03-05 MED ORDER — NA SULFATE-K SULFATE-MG SULF 17.5-3.13-1.6 GM/177ML PO SOLN: ORAL | 0 refills | Status: DC

## 2018-03-05 NOTE — Progress Notes (Signed)
Registered in [place]  Pt has a hx of Pseudo-seizures.  None since 2002 and no medications taken  No diet medications taken  No home oxygen used or hx of sleep apnea  No anesthesia or intubation problems per pt  No egg or soy allergy

## 2018-03-09 ENCOUNTER — Encounter: Payer: Self-pay | Admitting: Gastroenterology

## 2018-03-12 DIAGNOSIS — M7731 Calcaneal spur, right foot: Secondary | ICD-10-CM | POA: Diagnosis not present

## 2018-03-12 DIAGNOSIS — R030 Elevated blood-pressure reading, without diagnosis of hypertension: Secondary | ICD-10-CM | POA: Diagnosis not present

## 2018-03-12 DIAGNOSIS — S93401A Sprain of unspecified ligament of right ankle, initial encounter: Secondary | ICD-10-CM | POA: Diagnosis not present

## 2018-03-16 ENCOUNTER — Ambulatory Visit (AMBULATORY_SURGERY_CENTER): Payer: 59 | Admitting: Gastroenterology

## 2018-03-16 ENCOUNTER — Encounter: Payer: Self-pay | Admitting: Gastroenterology

## 2018-03-16 VITALS — BP 117/76 | HR 68 | Temp 98.9°F | Resp 15 | Ht 70.0 in | Wt 212.0 lb

## 2018-03-16 DIAGNOSIS — Z1211 Encounter for screening for malignant neoplasm of colon: Secondary | ICD-10-CM

## 2018-03-16 DIAGNOSIS — K5730 Diverticulosis of large intestine without perforation or abscess without bleeding: Secondary | ICD-10-CM

## 2018-03-16 MED ORDER — SODIUM CHLORIDE 0.9 % IV SOLN: 500.0000 mL | Freq: Once | INTRAVENOUS | Status: DC

## 2018-03-16 NOTE — Patient Instructions (Signed)
Discharge instructions given. Handout on Diverticulosis. Resume previous medications. YOU HAD AN ENDOSCOPIC PROCEDURE TODAY AT THE [facility]:   Refer to the procedure report that was given to you for any specific questions about what was found during the examination.  If the procedure report does not answer your questions, please call your gastroenterologist to clarify.  If you requested that your care partner not be given the details of your procedure findings, then the procedure report has been included in a sealed envelope for you to review at your convenience later.  YOU SHOULD EXPECT: Some feelings of bloating in the abdomen. Passage of more gas than usual.  Walking can help get rid of the air that was put into your GI tract during the procedure and reduce the bloating. If you had a lower endoscopy (such as a colonoscopy or flexible sigmoidoscopy) you may notice spotting of blood in your stool or on the toilet paper. If you underwent a bowel prep for your procedure, you may not have a normal bowel movement for a few days.  Please Note:  You might notice some irritation and congestion in your nose or some drainage.  This is from the oxygen used during your procedure.  There is no need for concern and it should clear up in a day or so.  SYMPTOMS TO REPORT IMMEDIATELY:   Following lower endoscopy (colonoscopy or flexible sigmoidoscopy):  Excessive amounts of blood in the stool  Significant tenderness or worsening of abdominal pains  Swelling of the abdomen that is new, acute  Fever of 100F or higher   For urgent or emergent issues, a gastroenterologist can be reached at any hour by calling [phone].   DIET:  We do recommend a small meal at first, but then you may proceed to your regular diet.  Drink plenty of fluids but you should avoid alcoholic beverages for 24 hours.  ACTIVITY:  You should plan to take it easy for the rest of today and you should NOT DRIVE or use  heavy machinery until tomorrow (because of the sedation medicines used during the test).    FOLLOW UP: Our staff will call the number listed on your records the next business day following your procedure to check on you and address any questions or concerns that you may have regarding the information given to you following your procedure. If we do not reach you, we will leave a message.  However, if you are feeling well and you are not experiencing any problems, there is no need to return our call.  We will assume that you have returned to your regular daily activities without incident.  If any biopsies were taken you will be contacted by phone or by letter within the next 1-3 weeks.  Please call us at [phone] if you have not heard about the biopsies in 3 weeks.    SIGNATURES/CONFIDENTIALITY: You and/or your care partner have signed paperwork which will be entered into your electronic medical record.  These signatures attest to the fact that that the information above on your After Visit Summary has been reviewed and is understood.  Full responsibility of the confidentiality of this discharge information lies with you and/or your care-partner. 

## 2018-03-16 NOTE — Progress Notes (Signed)
Report to PACU, RN, vss, BBS= Clear.  

## 2018-03-16 NOTE — Op Note (Signed)
[facility] Patient Name: Alexis Hartman Procedure Date: 03/16/2018 8:33 AM MRN: [id number] Endoscopist: Doristine Locks , MD Age: 52 Referring MD:  Date of Birth: 18-Jan-1966 Gender: Female Account #: [id number] Procedure:                Colonoscopy Indications:              Screening for colorectal malignant neoplasm, This                            is the patient's first colonoscopy Medicines:                Monitored Anesthesia Care Procedure:                Pre-Anesthesia Assessment:                           - Prior to the procedure, a History and Physical                            was performed, and patient medications and                            allergies were reviewed. The patient's tolerance of                            previous anesthesia was also reviewed. The risks                            and benefits of the procedure and the sedation                            options and risks were discussed with the patient.                            All questions were answered, and informed consent                            was obtained. Prior Anticoagulants: The patient has                            taken no previous anticoagulant or antiplatelet                            agents. ASA Grade Assessment: II - A patient with                            mild systemic disease. After reviewing the risks                            and benefits, the patient was deemed in                            satisfactory condition to undergo the procedure.  After obtaining informed consent, the colonoscope                            was passed under direct vision. Throughout the                            procedure, the patient's blood pressure, pulse, and                            oxygen saturations were monitored continuously. The                            Colonoscope was introduced through the anus and                            advanced to the the  terminal ileum. The Model                            [id number]-[id number]) scope was introduced                            through the and advanced to the. The colonoscopy                            was technically difficult and complex due to                            significant looping and a tortuous colon.                            Successful completion of the procedure was aided by                            using manual pressure and withdrawing the scope and                            replacing with the pediatric colonoscope. The                            patient tolerated the procedure well. The quality                            of the bowel preparation was adequate. Scope In: 8:52:51 AM Scope Out: 9:15:54 AM Scope Withdrawal Time: 0 hours 10 minutes 56 seconds  Total Procedure Duration: 0 hours 23 minutes 3 seconds  Findings:                 The perianal and digital rectal examinations were                            normal.                           Multiple small and large-mouthed diverticula were  found in the sigmoid colon and ascending colon.                           The sigmoid colon was significantly tortuous.                            Advancing the scope required using manual pressure                            and withdrawing the scope and replacing with the                            pediatric colonoscope. The area of tortuosity was                            in the region of the most dense diverticulosis.                           Retroflexion in the right colon was performed.                           There was a small lipoma, in the transverse colon.                           The retroflexed view of the distal rectum and anal                            verge was normal and showed no anal or rectal                            abnormalities.                           The terminal ileum appeared normal. Complications:            No  immediate complications. Estimated Blood Loss:     Estimated blood loss: none. Impression:               - Diverticulosis in the sigmoid colon and in the                            ascending colon.                           - Tortuous colon.                           - Small lipoma in the transverse colon.                           - The distal rectum and anal verge are normal on                            retroflexion view.                           -  The examined portion of the ileum was normal.                           - No specimens collected. Recommendation:           - Patient has a contact number available for                            emergencies. The signs and symptoms of potential                            delayed complications were discussed with the                            patient. Return to normal activities tomorrow.                            Written discharge instructions were provided to the                            patient.                           - Resume previous diet today.                           - Continue present medications.                           - Repeat colonoscopy in 10 years for screening                            purposes.                           - Return to GI clinic PRN. Doristine Locks, MD 03/16/2018 9:23:09 AM

## 2018-03-16 NOTE — Progress Notes (Signed)
Pt's states no medical or surgical changes since previsit or office visit. 

## 2018-03-17 ENCOUNTER — Telehealth: Payer: Self-pay | Admitting: *Deleted

## 2018-03-17 NOTE — Telephone Encounter (Signed)
No answer, left message to call if questions or concerns. 

## 2018-03-17 NOTE — Telephone Encounter (Signed)
Message left

## 2018-03-21 DIAGNOSIS — S93491A Sprain of other ligament of right ankle, initial encounter: Secondary | ICD-10-CM | POA: Diagnosis not present

## 2018-04-06 DIAGNOSIS — S93491D Sprain of other ligament of right ankle, subsequent encounter: Secondary | ICD-10-CM | POA: Diagnosis not present

## 2018-04-08 DIAGNOSIS — M25671 Stiffness of right ankle, not elsewhere classified: Secondary | ICD-10-CM | POA: Diagnosis not present

## 2018-04-08 DIAGNOSIS — M6281 Muscle weakness (generalized): Secondary | ICD-10-CM | POA: Diagnosis not present

## 2018-04-08 DIAGNOSIS — R262 Difficulty in walking, not elsewhere classified: Secondary | ICD-10-CM | POA: Diagnosis not present

## 2018-04-20 DIAGNOSIS — M25571 Pain in right ankle and joints of right foot: Secondary | ICD-10-CM | POA: Diagnosis not present

## 2018-04-21 DIAGNOSIS — Z1231 Encounter for screening mammogram for malignant neoplasm of breast: Secondary | ICD-10-CM | POA: Diagnosis not present

## 2018-04-22 DIAGNOSIS — M25571 Pain in right ankle and joints of right foot: Secondary | ICD-10-CM | POA: Diagnosis not present

## 2018-04-27 DIAGNOSIS — S93491A Sprain of other ligament of right ankle, initial encounter: Secondary | ICD-10-CM | POA: Diagnosis not present

## 2018-05-05 LAB — HM MAMMOGRAPHY

## 2018-05-13 ENCOUNTER — Encounter: Payer: Self-pay | Admitting: Internal Medicine

## 2018-05-25 DIAGNOSIS — S93491D Sprain of other ligament of right ankle, subsequent encounter: Secondary | ICD-10-CM | POA: Diagnosis not present

## 2018-05-25 DIAGNOSIS — M25571 Pain in right ankle and joints of right foot: Secondary | ICD-10-CM | POA: Diagnosis not present

## 2018-05-30 DIAGNOSIS — R300 Dysuria: Secondary | ICD-10-CM | POA: Diagnosis not present

## 2018-06-03 NOTE — Progress Notes (Signed)
Subjective:    Patient ID: Alexis Hartman, female    DOB: [birth year], 52 y.o.   MRN: [id number]  HPI The patient is here for follow up.   Diabetes: She is controlling her sugars with diet. She is compliant with a diabetic diet. She is exercising regularly.  She checks her sugars at home and they have been well controlled.   Hyperlipidemia: She is controlling her cholesterol with diet. She is compliant with a low fat/cholesterol diet. She is exercising regularly.      Medications and allergies reviewed with patient and updated if appropriate.  Patient Active Problem List   Diagnosis Date Noted  . Diabetes (HCC) 12/02/2017  . Hypertension 12/01/2017  . Asthma 11/28/2016  . 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 Family history of diabetes mellitus (DM) 11/28/2016  . Chest tightness 11/28/2016  . 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 HYPERCHOLESTEROLEMIA 08/18/2010  . NECK PAIN, RIGHT 10/13/2007  . ALLERGIC RHINITIS 06/30/2007    Current Outpatient Medications on File Prior to Visit  Medication Sig Dispense Refill  . acetaminophen (TYLENOL) 500 MG tablet Take 1,000 mg by mouth every 6 (six) hours as needed for moderate pain.    . Cholecalciferol (VITAMIN D) 2000 UNITS CAPS Take 1 capsule by mouth daily.    Marland Kitchen. GARLIC PO Take by mouth daily.    . Multiple Vitamin (MULTIVITAMIN) tablet Take 1 tablet by mouth daily.      . Omega-3 Fatty Acids (OMEGA 3 PO) Take by mouth daily.    . Polyethylene Glycol 3350 (MIRALAX PO) Take by mouth as needed.    Marland Kitchen. UNABLE TO FIND Take 1 tablet by mouth daily. [employer]     No current facility-administered medications on file prior to visit.     Past Medical History:  Diagnosis Date  . Acute bronchitis   . Allergic rhinitis, cause unspecified   . Asthma   . Atypical face pain   . Cervicalgia   . Diabetes mellitus without complication (HCC)    dx by MD, but no medications taken   . Esophageal reflux    hx of  .  Headache(784.0)   . Other convulsions    pseudo seizures  2002- none since then  . Pure hypercholesterolemia    no prescription medications taken    Past Surgical History:  Procedure Laterality Date  . ABDOMINAL HYSTERECTOMY    . CYSTECTOMY  01/2004   Dr. Eda PaschalGottsegen  . LAPAROSCOPIC CHOLECYSTECTOMY  05/2010   Dr. Abbey Chattersosenbower  . LUMBAR LAMINECTOMY/DECOMPRESSION MICRODISCECTOMY Left 01/07/2017   Procedure: LEFT SIDED LUMBAR 5-SACRUM 1 MICRODISCECTOMY;  Surgeon: Estill Bambergumonski, Mark, MD;  Location: [facility] OR;  Service: Orthopedics;  Laterality: Left;  LEFT SIDED LUMBAR 5-SACRUM 1 MICRODISCECTOMY; REQUEST 2 HOURS AND FLIP ROOM  . VESICOVAGINAL FISTULA CLOSURE W/ TAH  01/2004   Dr. Eda PaschalGottsegen    Social History   Socioeconomic History  . Marital status: Married    Spouse name: Not on file  . Number of children: 3  . Years of education: Not on file  . Highest education level: Not on file  Occupational History  . Occupation: Event organisermanager    Employer: [employer]  Social Needs  . Financial resource strain: Not on file  . Food insecurity:    Worry: Not on file    Inability: Not on file  . Transportation needs:    Medical: Not on file    Non-medical: Not on file  Tobacco Use  . Smoking status:  Never Smoker  . Smokeless tobacco: Never Used  Substance and Sexual Activity  . Alcohol use: No    Alcohol/week: 0.0 standard drinks  . Drug use: No  . Sexual activity: Not on file  Lifestyle  . Physical activity:    Days per week: Not on file    Minutes per session: Not on file  . Stress: Not on file  Relationships  . Social connections:    Talks on phone: Not on file    Gets together: Not on file    Attends religious service: Not on file    Active member of club or organization: Not on file    Attends meetings of clubs or organizations: Not on file    Relationship status: Not on file  Other Topics Concern  . Not on file  Social History Narrative  . Not on file    Family History    Problem Relation Age of Onset  . Diabetes Father   . Hypertension Mother   . Diabetes Mother   . Alcohol abuse Mother        in remission  . Asthma Sister   . Depression Paternal Uncle   . Hyperlipidemia Maternal Grandmother   . Colon cancer Neg Hx   . Esophageal cancer Neg Hx   . Stomach cancer Neg Hx   . Rectal cancer Neg Hx     Review of Systems  Constitutional: Negative for chills and fever.  HENT: Positive for congestion.   Respiratory: Positive for cough (mild with URI). Negative for shortness of breath and wheezing.   Cardiovascular: Negative for chest pain, palpitations and leg swelling.  Neurological: Positive for headaches (mild with cold symptoms). Negative for light-headedness.       Objective:   Vitals:   06/04/18 0742  BP: 136/82  Pulse: 72  Resp: 16  Temp: 98.4 F (36.9 C)  SpO2: 98%   BP Readings from Last 3 Encounters:  06/04/18 136/82  03/16/18 117/76  12/02/17 (!) 142/94   Wt Readings from Last 3 Encounters:  06/04/18 205 lb (93 kg)  03/16/18 212 lb (96.2 kg)  03/05/18 202 lb (91.6 kg)   Body mass index is 29.41 kg/m.   Physical Exam    Constitutional: Appears well-developed and well-nourished. No distress.  HENT:  Head: Normocephalic and atraumatic.  Neck: Neck supple. No tracheal deviation present. No thyromegaly present.  No cervical lymphadenopathy Cardiovascular: Normal rate, regular rhythm and normal heart sounds.   No murmur heard. No carotid bruit .  No edema Pulmonary/Chest: Effort normal and breath sounds normal. No respiratory distress. No has no wheezes. No rales.  Skin: Skin is warm and dry. Not diaphoretic.  Psychiatric: Normal mood and affect. Behavior is normal.      Assessment & Plan:    See Problem List for Assessment and Plan of chronic medical problems.

## 2018-06-03 NOTE — Assessment & Plan Note (Signed)
Managed with lifestyle Has been monitoring BP at home -

## 2018-06-03 NOTE — Patient Instructions (Addendum)
  Tests ordered today. Your results will be released to MyChart (or called to you) after review, usually within 72hours after test completion. If any changes need to be made, you will be notified at that same time.  Flu immunization administered today.    Medications reviewed and updated.  Changes include :   none    Please followup in 6 months   

## 2018-06-03 NOTE — Assessment & Plan Note (Addendum)
Diet controlled Check a1c Low sugar / carb diet Stressed regular exercise, weight loss  

## 2018-06-04 ENCOUNTER — Ambulatory Visit (INDEPENDENT_AMBULATORY_CARE_PROVIDER_SITE_OTHER): Payer: 59 | Admitting: Internal Medicine

## 2018-06-04 ENCOUNTER — Encounter: Payer: Self-pay | Admitting: Internal Medicine

## 2018-06-04 ENCOUNTER — Other Ambulatory Visit (INDEPENDENT_AMBULATORY_CARE_PROVIDER_SITE_OTHER): Payer: 59

## 2018-06-04 VITALS — BP 136/82 | HR 72 | Temp 98.4°F | Resp 16 | Ht 70.0 in | Wt 205.0 lb

## 2018-06-04 DIAGNOSIS — E7800 Pure hypercholesterolemia, unspecified: Secondary | ICD-10-CM

## 2018-06-04 DIAGNOSIS — E119 Type 2 diabetes mellitus without complications: Secondary | ICD-10-CM | POA: Diagnosis not present

## 2018-06-04 DIAGNOSIS — Z23 Encounter for immunization: Secondary | ICD-10-CM | POA: Diagnosis not present

## 2018-06-04 LAB — LIPID PANEL
CHOLESTEROL: 252 mg/dL — AB (ref 0–200)
HDL: 78.3 mg/dL (ref >39.00)
LDL CALC: 161 mg/dL — AB (ref 0–99)
NonHDL: 173.55
TRIGLYCERIDES: 65 mg/dL (ref 0.0–149.0)
Total CHOL/HDL Ratio: 3
VLDL: 13 mg/dL (ref 0.0–40.0)

## 2018-06-04 LAB — COMPREHENSIVE METABOLIC PANEL
ALT: 19 U/L (ref 0–35)
AST: 26 U/L (ref 0–37)
Albumin: 4.8 g/dL (ref 3.5–5.2)
Alkaline Phosphatase: 52 U/L (ref 39–117)
BUN: 17 mg/dL (ref 6–23)
CALCIUM: 10.3 mg/dL (ref 8.4–10.5)
CHLORIDE: 105 meq/L (ref 96–112)
CO2: 27 meq/L (ref 19–32)
Creatinine, Ser: 1.03 mg/dL (ref 0.40–1.20)
GFR: 72.19 mL/min (ref >60.00)
Glucose, Bld: 104 mg/dL — ABNORMAL HIGH (ref 70–99)
Potassium: 4.1 mEq/L (ref 3.5–5.1)
Sodium: 141 mEq/L (ref 135–145)
Total Bilirubin: 0.5 mg/dL (ref 0.2–1.2)
Total Protein: 7.9 g/dL (ref 6.0–8.3)

## 2018-06-04 LAB — MICROALBUMIN / CREATININE URINE RATIO
CREATININE, U: 193.4 mg/dL
MICROALB/CREAT RATIO: 2.3 mg/g (ref 0.0–30.0)
Microalb, Ur: 4.4 mg/dL — ABNORMAL HIGH (ref 0.0–1.9)

## 2018-06-04 LAB — HEMOGLOBIN A1C: Hgb A1c MFr Bld: 6.2 % (ref 4.6–6.5)

## 2018-06-04 NOTE — Assessment & Plan Note (Signed)
Check lipid panel  Controlled with lifestyle Regular exercise and healthy diet encouraged  

## 2018-06-04 NOTE — Addendum Note (Signed)
Addended by: Mercer PodWRENN, Kianni Lheureux E on: 06/04/2018 01:54 PM   Modules accepted: Orders

## 2018-06-05 ENCOUNTER — Encounter: Payer: Self-pay | Admitting: Internal Medicine

## 2018-08-17 DIAGNOSIS — Z719 Counseling, unspecified: Secondary | ICD-10-CM | POA: Diagnosis not present

## 2018-08-18 DIAGNOSIS — Z719 Counseling, unspecified: Secondary | ICD-10-CM | POA: Diagnosis not present

## 2018-08-25 DIAGNOSIS — Z719 Counseling, unspecified: Secondary | ICD-10-CM | POA: Diagnosis not present

## 2018-09-01 DIAGNOSIS — Z719 Counseling, unspecified: Secondary | ICD-10-CM | POA: Diagnosis not present

## 2018-09-08 DIAGNOSIS — Z719 Counseling, unspecified: Secondary | ICD-10-CM | POA: Diagnosis not present

## 2018-09-13 ENCOUNTER — Other Ambulatory Visit (INDEPENDENT_AMBULATORY_CARE_PROVIDER_SITE_OTHER): Payer: 59

## 2018-09-13 ENCOUNTER — Encounter: Payer: Self-pay | Admitting: Internal Medicine

## 2018-09-13 ENCOUNTER — Ambulatory Visit (INDEPENDENT_AMBULATORY_CARE_PROVIDER_SITE_OTHER): Payer: 59 | Admitting: Internal Medicine

## 2018-09-13 ENCOUNTER — Other Ambulatory Visit: Payer: Self-pay

## 2018-09-13 VITALS — BP 140/86 | HR 68 | Temp 98.9°F | Resp 16 | Ht 70.0 in | Wt 206.8 lb

## 2018-09-13 DIAGNOSIS — L853 Xerosis cutis: Secondary | ICD-10-CM | POA: Insufficient documentation

## 2018-09-13 DIAGNOSIS — Z23 Encounter for immunization: Secondary | ICD-10-CM

## 2018-09-13 LAB — SEDIMENTATION RATE: Sed Rate: 12 mm/hr (ref 0–30)

## 2018-09-13 NOTE — Patient Instructions (Signed)
  Tests ordered today. Your results will be released to MyChart (or called to you) after review, usually within 72hours after test completion. If any changes need to be made, you will be notified at that same time.  Call if no improvement

## 2018-09-13 NOTE — Progress Notes (Signed)
Subjective:    Patient ID: Alexis Hartman, female    DOB: 1965-11-07, 53 y.o.   MRN: [id number]  HPI The patient is here for an acute visit.   Her hands have been peeling.  It occurs in patches.  Her right thumb is the worst - this is very painful and raw.  She has had this in the winter over the past few years.  She has tried several different lotions and creams and they help.  She tried vaseline and it did not help.  She tried applying neosporin to her thumb and keeping it covered with a bandaid.       She does not wash her hands excessively.  She does not do dishes. She has no joint pain in her hands.  She denies joint pain elsewhere.  She denies any other skin issues.  She was concerned because when she mentioned this to a friend they told her that it could be related to lupus.  She wanted that to be ruled out.   Medications and allergies reviewed with patient and updated if appropriate.  Patient Active Problem List   Diagnosis Date Noted  . Diabetes (HCC) 12/02/2017  . Asthma 11/28/2016  . 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 Family history of diabetes mellitus (DM) 11/28/2016  . 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 HYPERCHOLESTEROLEMIA 08/18/2010  . NECK PAIN, RIGHT 10/13/2007  . ALLERGIC RHINITIS 06/30/2007    Current Outpatient Medications on File Prior to Visit  Medication Sig Dispense Refill  . acetaminophen (TYLENOL) 500 MG tablet Take 1,000 mg by mouth every 6 (six) hours as needed for moderate pain.    . Cholecalciferol (VITAMIN D) 2000 UNITS CAPS Take 1 capsule by mouth daily.    . Multiple Vitamin (MULTIVITAMIN) tablet Take 1 tablet by mouth daily.      . Omega-3 Fatty Acids (OMEGA 3 PO) Take by mouth daily.    . Polyethylene Glycol 3350 (MIRALAX PO) Take by mouth as needed.     No current facility-administered medications on file prior to visit.     Past Medical History:  Diagnosis Date  . Acute bronchitis   . Allergic rhinitis, cause  unspecified   . Asthma   . Atypical face pain   . Cervicalgia   . Diabetes mellitus without complication (HCC)    dx by MD, but no medications taken   . Esophageal reflux    hx of  . Headache(784.0)   . Other convulsions    pseudo seizures  2002- none since then  . Pure hypercholesterolemia    no prescription medications taken    Past Surgical History:  Procedure Laterality Date  . ABDOMINAL HYSTERECTOMY    . CYSTECTOMY  01/2004   Dr. Eda Paschal  . LAPAROSCOPIC CHOLECYSTECTOMY  05/2010   Dr. Abbey Chatters  . LUMBAR LAMINECTOMY/DECOMPRESSION MICRODISCECTOMY Left 01/07/2017   Procedure: LEFT SIDED LUMBAR 5-SACRUM 1 MICRODISCECTOMY;  Surgeon: Estill Bamberg, MD;  Location: [facility] OR;  Service: Orthopedics;  Laterality: Left;  LEFT SIDED LUMBAR 5-SACRUM 1 MICRODISCECTOMY; REQUEST 2 HOURS AND FLIP ROOM  . VESICOVAGINAL FISTULA CLOSURE W/ TAH  01/2004   Dr. Eda Paschal    Social History   Socioeconomic History  . Marital status: Married    Spouse name: Not on file  . Number of children: 3  . Years of education: Not on file  . Highest education level: Not on file  Occupational History  . Occupation: Event organiser: [employer]  Social Needs  .  Financial resource strain: Not on file  . Food insecurity:    Worry: Not on file    Inability: Not on file  . Transportation needs:    Medical: Not on file    Non-medical: Not on file  Tobacco Use  . Smoking status: Never Smoker  . Smokeless tobacco: Never Used  Substance and Sexual Activity  . Alcohol use: No    Alcohol/week: 0.0 standard drinks  . Drug use: No  . Sexual activity: Not on file  Lifestyle  . Physical activity:    Days per week: Not on file    Minutes per session: Not on file  . Stress: Not on file  Relationships  . Social connections:    Talks on phone: Not on file    Gets together: Not on file    Attends religious service: Not on file    Active member of club or organization: Not on file    Attends  meetings of clubs or organizations: Not on file    Relationship status: Not on file  Other Topics Concern  . Not on file  Social History Narrative  . Not on file    Family History  Problem Relation Age of Onset  . Diabetes Father   . Hypertension Mother   . Diabetes Mother   . Alcohol abuse Mother        in remission  . Asthma Sister   . Depression Paternal Uncle   . Hyperlipidemia Maternal Grandmother   . Colon cancer Neg Hx   . Esophageal cancer Neg Hx   . Stomach cancer Neg Hx   . Rectal cancer Neg Hx     Review of Systems  Constitutional: Negative for fever.  Musculoskeletal: Negative for arthralgias, joint swelling and myalgias.  Skin: Negative for color change and rash.       Areas of dry skin/peeling skin bilateral hands       Objective:   Vitals:   09/13/18 1429  BP: 140/86  Pulse: 68  Resp: 16  Temp: 98.9 F (37.2 C)  SpO2: 99%   BP Readings from Last 3 Encounters:  09/13/18 140/86  06/04/18 136/82  03/16/18 117/76   Wt Readings from Last 3 Encounters:  09/13/18 206 lb 12.8 oz (93.8 kg)  06/04/18 205 lb (93 kg)  03/16/18 212 lb (96.2 kg)   Body mass index is 29.67 kg/m.   Physical Exam Constitutional:      General: She is not in acute distress.    Appearance: Normal appearance. She is not ill-appearing.  HENT:     Head: Normocephalic and atraumatic.  Skin:    General: Skin is warm and dry.     Findings: No erythema or rash.     Comments: Patches of dry skin bilateral hands-worse area anterior right thumb that has peeling skin in areas of missing skin, mildly tender other areas on hands skin is intact-just dry  Neurological:     Mental Status: She is alert.            Assessment & Plan:    See Problem List for Assessment and Plan of chronic medical problems.

## 2018-09-13 NOTE — Assessment & Plan Note (Signed)
Dry skin in patches bilateral hands Has been occurring annually only in the winter Applying over-the-counter lotions and creams, Vaseline, Neosporin-most of which is helping Right thumb is the area that is worse and is raw and painful Her concern is if this could be lupus Unlikely lupus-likely just severe dry skin that she has had in the past Will check ANA, ESR She feels for the most part she is controlling her symptoms with her current measures For the severe dry areas she can try her daughter steroid cream a couple of times a day to see if that helps Can refer to Derm if needed, but not needed at this time

## 2018-09-14 ENCOUNTER — Encounter: Payer: Self-pay | Admitting: Internal Medicine

## 2018-09-14 LAB — ANA: Anti Nuclear Antibody(ANA): NEGATIVE

## 2018-09-28 DIAGNOSIS — M545 Low back pain: Secondary | ICD-10-CM | POA: Diagnosis not present

## 2018-11-29 ENCOUNTER — Encounter: Payer: Self-pay | Admitting: Internal Medicine

## 2018-12-04 NOTE — Progress Notes (Signed)
Subjective:    Patient ID: Alexis Hartman, female    DOB: [birth year], 53 y.o.   MRN: [id number]  HPI She is here for a physical exam.    Nasal congestion:  It has been persistent for the past 4-5 months.  She takes claritin D and it helps some.  She takes it as needed.  She does have allergies.  She sneezes sometimes.    Her ankle still bothers her - she injured it a while ago.   She is compliant with the low sugar/carb diet until it comes to a holiday or birthday.  She is working from home now.    Her daughter does check her BP at home.  It is usually good.    Medications and allergies reviewed with patient and updated if appropriate.  Patient Active Problem List   Diagnosis Date Noted  . Elevated blood pressure reading 12/06/2018  . Xerosis of skin 09/13/2018  . Diabetes (HCC) 12/02/2017  . Asthma 11/28/2016  . 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 Family history of diabetes mellitus (DM) 11/28/2016  . 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 HYPERCHOLESTEROLEMIA 08/18/2010  . NECK PAIN, RIGHT 10/13/2007  . Allergic rhinitis 06/30/2007    Current Outpatient Medications on File Prior to Visit  Medication Sig Dispense Refill  . acetaminophen (TYLENOL) 500 MG tablet Take 1,000 mg by mouth every 6 (six) hours as needed for moderate pain.    . Cholecalciferol (VITAMIN D) 2000 UNITS CAPS Take 1 capsule by mouth daily.    . Multiple Vitamin (MULTIVITAMIN) tablet Take 1 tablet by mouth daily.      . Omega-3 Fatty Acids (OMEGA 3 PO) Take by mouth daily.    . Polyethylene Glycol 3350 (MIRALAX PO) Take by mouth as needed.     No current facility-administered medications on file prior to visit.     Past Medical History:  Diagnosis Date  . Acute bronchitis   . Allergic rhinitis, cause unspecified   . Asthma   . Atypical face pain   . Cervicalgia   . Diabetes mellitus without complication (HCC)    dx by MD, but no medications taken   . Esophageal reflux    hx of  .  Headache(784.0)   . Other convulsions    pseudo seizures  2002- none since then  . Pure hypercholesterolemia    no prescription medications taken    Past Surgical History:  Procedure Laterality Date  . ABDOMINAL HYSTERECTOMY    . CYSTECTOMY  01/2004   Dr. Eda PaschalGottsegen  . LAPAROSCOPIC CHOLECYSTECTOMY  05/2010   Dr. Abbey Chattersosenbower  . LUMBAR LAMINECTOMY/DECOMPRESSION MICRODISCECTOMY Left 01/07/2017   Procedure: LEFT SIDED LUMBAR 5-SACRUM 1 MICRODISCECTOMY;  Surgeon: Estill Bambergumonski, Mark, MD;  Location: [facility] OR;  Service: Orthopedics;  Laterality: Left;  LEFT SIDED LUMBAR 5-SACRUM 1 MICRODISCECTOMY; REQUEST 2 HOURS AND FLIP ROOM  . VESICOVAGINAL FISTULA CLOSURE W/ TAH  01/2004   Dr. Eda PaschalGottsegen    Social History   Socioeconomic History  . Marital status: Married    Spouse name: Not on file  . Number of children: 3  . Years of education: Not on file  . Highest education level: Not on file  Occupational History  . Occupation: Event organisermanager    Employer: [employer]  Social Needs  . Financial resource strain: Not on file  . Food insecurity:    Worry: Not on file    Inability: Not on file  . Transportation needs:    Medical: Not on file  Non-medical: Not on file  Tobacco Use  . Smoking status: Never Smoker  . Smokeless tobacco: Never Used  Substance and Sexual Activity  . Alcohol use: No    Alcohol/week: 0.0 standard drinks  . Drug use: No  . Sexual activity: Not on file  Lifestyle  . Physical activity:    Days per week: Not on file    Minutes per session: Not on file  . Stress: Not on file  Relationships  . Social connections:    Talks on phone: Not on file    Gets together: Not on file    Attends religious service: Not on file    Active member of club or organization: Not on file    Attends meetings of clubs or organizations: Not on file    Relationship status: Not on file  Other Topics Concern  . Not on file  Social History Narrative  . Not on file    Family History   Problem Relation Age of Onset  . Diabetes Father   . Hypertension Mother   . Diabetes Mother   . Alcohol abuse Mother        in remission  . Asthma Sister   . Depression Paternal Uncle   . Hyperlipidemia Maternal Grandmother   . Colon cancer Neg Hx   . Esophageal cancer Neg Hx   . Stomach cancer Neg Hx   . Rectal cancer Neg Hx     Review of Systems  Constitutional: Negative for chills and fever.  HENT: Positive for congestion and sneezing.   Eyes: Negative for visual disturbance.  Respiratory: Negative for cough, shortness of breath and wheezing.   Cardiovascular: Positive for palpitations (rare, mild). Negative for chest pain and leg swelling.  Gastrointestinal: Negative for abdominal pain, blood in stool, constipation, diarrhea and nausea.       Rare gerd  Genitourinary: Negative for dysuria and hematuria.  Musculoskeletal: Positive for arthralgias (right ankle) and back pain.  Skin: Negative for color change and rash.  Neurological: Positive for light-headedness (rare) and headaches (occasional). Negative for dizziness.  Psychiatric/Behavioral: Negative for dysphoric mood. The patient is not nervous/anxious.        Objective:   Vitals:   12/06/18 0744  BP: (!) 162/98  Pulse: (!) 55  Resp: 16  Temp: 98.3 F (36.8 C)  SpO2: 99%   Filed Weights   12/06/18 0744  Weight: 201 lb 12.8 oz (91.5 kg)   Body mass index is 28.96 kg/m.  BP Readings from Last 3 Encounters:  12/06/18 (!) 162/98  09/13/18 140/86  06/04/18 136/82    Wt Readings from Last 3 Encounters:  12/06/18 201 lb 12.8 oz (91.5 kg)  09/13/18 206 lb 12.8 oz (93.8 kg)  06/04/18 205 lb (93 kg)     Physical Exam Constitutional: She appears well-developed and well-nourished. No distress.  HENT:  Head: Normocephalic and atraumatic.  Right Ear: External ear normal. Normal ear canal and TM Left Ear: External ear normal.  Normal ear canal and TM Mouth/Throat: Oropharynx is clear and moist.  Eyes:  Conjunctivae and EOM are normal.  Neck: Neck supple. No tracheal deviation present. No thyromegaly present.  No carotid bruit  Cardiovascular: Normal rate, regular rhythm and normal heart sounds.   No murmur heard.  No edema. Pulmonary/Chest: Effort normal and breath sounds normal. No respiratory distress. She has no wheezes. She has no rales.  Breast: deferred   Abdominal: Soft. She exhibits no distension. There is no tenderness.  Lymphadenopathy: She  has no cervical adenopathy.  Skin: Skin is warm and dry. She is not diaphoretic.  Psychiatric: She has a normal mood and affect. Her behavior is normal.   Diabetic Foot Exam - Simple   Simple Foot Form Diabetic Foot exam was performed with the following findings:  Yes 12/06/2018  8:21 AM  Visual Inspection No deformities, no ulcerations, no other skin breakdown bilaterally:  Yes Sensation Testing Intact to touch and monofilament testing bilaterally:  Yes See comments:  Yes Pulse Check Posterior Tibialis and Dorsalis pulse intact bilaterally:  Yes Comments Minimal decreased sensation left big toe from back surgery         Assessment & Plan:   Physical exam: Screening blood work ordered Immunizations  Up to date  Colonoscopy   Up to date  Mammogram    Up to date  Gyn   Not up to date - will schedule Eye exams    Not up to date - will schedule Exercise   Regular - online classes, walking Weight     overweight Skin     No concerns Substance abuse  none   See Problem List for Assessment and Plan of chronic medical problems.   FU in 6 months

## 2018-12-06 ENCOUNTER — Other Ambulatory Visit: Payer: Self-pay

## 2018-12-06 ENCOUNTER — Other Ambulatory Visit (INDEPENDENT_AMBULATORY_CARE_PROVIDER_SITE_OTHER): Payer: 59

## 2018-12-06 ENCOUNTER — Encounter: Payer: Self-pay | Admitting: Internal Medicine

## 2018-12-06 ENCOUNTER — Ambulatory Visit (INDEPENDENT_AMBULATORY_CARE_PROVIDER_SITE_OTHER): Payer: 59 | Admitting: Internal Medicine

## 2018-12-06 VITALS — BP 162/98 | HR 55 | Temp 98.3°F | Resp 16 | Ht 70.0 in | Wt 201.8 lb

## 2018-12-06 DIAGNOSIS — E119 Type 2 diabetes mellitus without complications: Secondary | ICD-10-CM | POA: Diagnosis not present

## 2018-12-06 DIAGNOSIS — Z0000 Encounter for general adult medical examination without abnormal findings: Secondary | ICD-10-CM

## 2018-12-06 DIAGNOSIS — E7800 Pure hypercholesterolemia, unspecified: Secondary | ICD-10-CM | POA: Diagnosis not present

## 2018-12-06 DIAGNOSIS — R030 Elevated blood-pressure reading, without diagnosis of hypertension: Secondary | ICD-10-CM

## 2018-12-06 DIAGNOSIS — Z0001 Encounter for general adult medical examination with abnormal findings: Secondary | ICD-10-CM | POA: Diagnosis not present

## 2018-12-06 DIAGNOSIS — J309 Allergic rhinitis, unspecified: Secondary | ICD-10-CM | POA: Diagnosis not present

## 2018-12-06 DIAGNOSIS — J4520 Mild intermittent asthma, uncomplicated: Secondary | ICD-10-CM

## 2018-12-06 LAB — CBC WITH DIFFERENTIAL/PLATELET
Basophils Absolute: 0 10*3/uL (ref 0.0–0.1)
Basophils Relative: 0.6 % (ref 0.0–3.0)
Eosinophils Absolute: 0.1 10*3/uL (ref 0.0–0.7)
Eosinophils Relative: 1.4 % (ref 0.0–5.0)
HCT: 40.5 % (ref 36.0–46.0)
Hemoglobin: 13.5 g/dL (ref 12.0–15.0)
Lymphocytes Relative: 65.8 % — ABNORMAL HIGH (ref 12.0–46.0)
Lymphs Abs: 4.3 10*3/uL — ABNORMAL HIGH (ref 0.7–4.0)
MCHC: 33.4 g/dL (ref 30.0–36.0)
MCV: 86.8 fl (ref 78.0–100.0)
Monocytes Absolute: 0.4 10*3/uL (ref 0.1–1.0)
Monocytes Relative: 6 % (ref 3.0–12.0)
Neutro Abs: 1.7 10*3/uL (ref 1.4–7.7)
Neutrophils Relative %: 26.2 % — ABNORMAL LOW (ref 43.0–77.0)
Platelets: 234 10*3/uL (ref 150.0–400.0)
RBC: 4.67 Mil/uL (ref 3.87–5.11)
RDW: 13.5 % (ref 11.5–15.5)
WBC: 6.6 10*3/uL (ref 4.0–10.5)

## 2018-12-06 LAB — COMPREHENSIVE METABOLIC PANEL
ALT: 12 U/L (ref 0–35)
AST: 18 U/L (ref 0–37)
Albumin: 4.5 g/dL (ref 3.5–5.2)
Alkaline Phosphatase: 59 U/L (ref 39–117)
BUN: 16 mg/dL (ref 6–23)
CO2: 28 mEq/L (ref 19–32)
Calcium: 9.7 mg/dL (ref 8.4–10.5)
Chloride: 104 mEq/L (ref 96–112)
Creatinine, Ser: 0.93 mg/dL (ref 0.40–1.20)
GFR: 76.27 mL/min (ref >60.00)
Glucose, Bld: 96 mg/dL (ref 70–99)
Potassium: 4.5 mEq/L (ref 3.5–5.1)
Sodium: 139 mEq/L (ref 135–145)
Total Bilirubin: 0.3 mg/dL (ref 0.2–1.2)
Total Protein: 7.5 g/dL (ref 6.0–8.3)

## 2018-12-06 LAB — TSH: TSH: 1.18 u[IU]/mL (ref 0.35–4.50)

## 2018-12-06 LAB — LIPID PANEL
Cholesterol: 238 mg/dL — ABNORMAL HIGH (ref 0–200)
HDL: 68 mg/dL (ref >39.00)
LDL Cholesterol: 151 mg/dL — ABNORMAL HIGH (ref 0–99)
NonHDL: 170.47
Total CHOL/HDL Ratio: 4
Triglycerides: 95 mg/dL (ref 0.0–149.0)
VLDL: 19 mg/dL (ref 0.0–40.0)

## 2018-12-06 LAB — HEMOGLOBIN A1C: Hgb A1c MFr Bld: 6.5 % (ref 4.6–6.5)

## 2018-12-06 NOTE — Assessment & Plan Note (Signed)
Diet controlled Check a1c Low sugar / carb diet Stressed regular exercise  

## 2018-12-06 NOTE — Assessment & Plan Note (Signed)
Blood pressure elevated here today-higher than usual.  It is typically elevated here, but she does monitor it at home and it is better controlled Typically her daughter who is EMS will check it a couple times a week Advised her to check it more regularly over the next couple of weeks to see if it truly is controlled Avoid decongestants, salt She will let me know if it is elevated so that we can consider medication

## 2018-12-06 NOTE — Patient Instructions (Addendum)
Monitor your BP closely at home.   Tests ordered today. Your results will be released to [place] (or called to you) after review, usually within 72hours after test completion. If any changes need to be made, you will be notified at that same time.  All other Health Maintenance issues reviewed.   All recommended immunizations and age-appropriate screenings are up-to-date or discussed.  No immunizations administered today.   Medications reviewed and updated.  Changes include :  none    Please followup in 6 months   Health Maintenance, Female Adopting a healthy lifestyle and getting preventive care can go a long way to promote health and wellness. Talk with your health care provider about what schedule of regular examinations is right for you. This is a good chance for you to check in with your provider about disease prevention and staying healthy. In between checkups, there are plenty of things you can do on your own. Experts have done a lot of research about which lifestyle changes and preventive measures are most likely to keep you healthy. Ask your health care provider for more information. Weight and diet Eat a healthy diet  Be sure to include plenty of vegetables, fruits, low-fat dairy products, and lean protein.  Do not eat a lot of foods high in solid fats, added sugars, or salt.  Get regular exercise. This is one of the most important things you can do for your health. ? Most adults should exercise for at least 150 minutes each week. The exercise should increase your heart rate and make you sweat (moderate-intensity exercise). ? Most adults should also do strengthening exercises at least twice a week. This is in addition to the moderate-intensity exercise. Maintain a healthy weight  Body mass index (BMI) is a measurement that can be used to identify possible weight problems. It estimates body fat based on height and weight. Your health care provider can help determine your BMI  and help you achieve or maintain a healthy weight.  For females 57 years of age and older: ? A BMI below 18.5 is considered underweight. ? A BMI of 18.5 to 24.9 is normal. ? A BMI of 25 to 29.9 is considered overweight. ? A BMI of 30 and above is considered obese. Watch levels of cholesterol and blood lipids  You should start having your blood tested for lipids and cholesterol at 53 years of age, then have this test every 5 years.  You may need to have your cholesterol levels checked more often if: ? Your lipid or cholesterol levels are high. ? You are older than 53 years of age. ? You are at high risk for heart disease. Cancer screening Lung Cancer  Lung cancer screening is recommended for adults 103-33 years old who are at high risk for lung cancer because of a history of smoking.  A yearly low-dose CT scan of the lungs is recommended for people who: ? Currently smoke. ? Have quit within the past 15 years. ? Have at least a 30-pack-year history of smoking. A pack year is smoking an average of one pack of cigarettes a day for 1 year.  Yearly screening should continue until it has been 15 years since you quit.  Yearly screening should stop if you develop a health problem that would prevent you from having lung cancer treatment. Breast Cancer  Practice breast self-awareness. This means understanding how your breasts normally appear and feel.  It also means doing regular breast self-exams. Let your health care provider  know about any changes, no matter how small.  If you are in your 20s or 30s, you should have a clinical breast exam (CBE) by a health care provider every 1-3 years as part of a regular health exam.  If you are 59 or older, have a CBE every year. Also consider having a breast X-ray (mammogram) every year.  If you have a family history of breast cancer, talk to your health care provider about genetic screening.  If you are at high risk for breast cancer, talk to your  health care provider about having an MRI and a mammogram every year.  Breast cancer gene (BRCA) assessment is recommended for women who have family members with BRCA-related cancers. BRCA-related cancers include: ? Breast. ? Ovarian. ? Tubal. ? Peritoneal cancers.  Results of the assessment will determine the need for genetic counseling and BRCA1 and BRCA2 testing. Cervical Cancer Your health care provider may recommend that you be screened regularly for cancer of the pelvic organs (ovaries, uterus, and vagina). This screening involves a pelvic examination, including checking for microscopic changes to the surface of your cervix (Pap test). You may be encouraged to have this screening done every 3 years, beginning at age 24.  For women ages 14-65, health care providers may recommend pelvic exams and Pap testing every 3 years, or they may recommend the Pap and pelvic exam, combined with testing for human papilloma virus (HPV), every 5 years. Some types of HPV increase your risk of cervical cancer. Testing for HPV may also be done on women of any age with unclear Pap test results.  Other health care providers may not recommend any screening for nonpregnant women who are considered low risk for pelvic cancer and who do not have symptoms. Ask your health care provider if a screening pelvic exam is right for you.  If you have had past treatment for cervical cancer or a condition that could lead to cancer, you need Pap tests and screening for cancer for at least 20 years after your treatment. If Pap tests have been discontinued, your risk factors (such as having a new sexual partner) need to be reassessed to determine if screening should resume. Some women have medical problems that increase the chance of getting cervical cancer. In these cases, your health care provider may recommend more frequent screening and Pap tests. Colorectal Cancer  This type of cancer can be detected and often  prevented.  Routine colorectal cancer screening usually begins at 53 years of age and continues through 53 years of age.  Your health care provider may recommend screening at an earlier age if you have risk factors for colon cancer.  Your health care provider may also recommend using home test kits to check for hidden blood in the stool.  A small camera at the end of a tube can be used to examine your colon directly (sigmoidoscopy or colonoscopy). This is done to check for the earliest forms of colorectal cancer.  Routine screening usually begins at age 21.  Direct examination of the colon should be repeated every 5-10 years through 53 years of age. However, you may need to be screened more often if early forms of precancerous polyps or small growths are found. Skin Cancer  Check your skin from head to toe regularly.  Tell your health care provider about any new moles or changes in moles, especially if there is a change in a mole's shape or color.  Also tell your health care provider if  you have a mole that is larger than the size of a pencil eraser.  Always use sunscreen. Apply sunscreen liberally and repeatedly throughout the day.  Protect yourself by wearing long sleeves, pants, a wide-brimmed hat, and sunglasses whenever you are outside. Heart disease, diabetes, and high blood pressure  High blood pressure causes heart disease and increases the risk of stroke. High blood pressure is more likely to develop in: ? People who have blood pressure in the high end of the normal range (130-139/85-89 mm Hg). ? People who are overweight or obese. ? People who are African American.  If you are 33-72 years of age, have your blood pressure checked every 3-5 years. If you are 40 years of age or older, have your blood pressure checked every year. You should have your blood pressure measured twice-once when you are at a hospital or clinic, and once when you are not at a hospital or clinic. Record  the average of the two measurements. To check your blood pressure when you are not at a hospital or clinic, you can use: ? An automated blood pressure machine at a pharmacy. ? A home blood pressure monitor.  If you are between 84 years and 26 years old, ask your health care provider if you should take aspirin to prevent strokes.  Have regular diabetes screenings. This involves taking a blood sample to check your fasting blood sugar level. ? If you are at a normal weight and have a low risk for diabetes, have this test once every three years after 53 years of age. ? If you are overweight and have a high risk for diabetes, consider being tested at a younger age or more often. Preventing infection Hepatitis B  If you have a higher risk for hepatitis B, you should be screened for this virus. You are considered at high risk for hepatitis B if: ? You were born in a country where hepatitis B is common. Ask your health care provider which countries are considered high risk. ? Your parents were born in a high-risk country, and you have not been immunized against hepatitis B (hepatitis B vaccine). ? You have HIV or AIDS. ? You use needles to inject street drugs. ? You live with someone who has hepatitis B. ? You have had sex with someone who has hepatitis B. ? You get hemodialysis treatment. ? You take certain medicines for conditions, including cancer, organ transplantation, and autoimmune conditions. Hepatitis C  Blood testing is recommended for: ? Everyone born from [birth year] through 1965. ? Anyone with known risk factors for hepatitis C. Sexually transmitted infections (STIs)  You should be screened for sexually transmitted infections (STIs) including gonorrhea and chlamydia if: ? You are sexually active and are younger than 53 years of age. ? You are older than 53 years of age and your health care provider tells you that you are at risk for this type of infection. ? Your sexual activity has  changed since you were last screened and you are at an increased risk for chlamydia or gonorrhea. Ask your health care provider if you are at risk.  If you do not have HIV, but are at risk, it may be recommended that you take a prescription medicine daily to prevent HIV infection. This is called pre-exposure prophylaxis (PrEP). You are considered at risk if: ? You are sexually active and do not regularly use condoms or know the HIV status of your partner(s). ? You take drugs by injection. ? You  are sexually active with a partner who has HIV. Talk with your health care provider about whether you are at high risk of being infected with HIV. If you choose to begin PrEP, you should first be tested for HIV. You should then be tested every 3 months for as long as you are taking PrEP. Pregnancy  If you are premenopausal and you may become pregnant, ask your health care provider about preconception counseling.  If you may become pregnant, take 400 to 800 micrograms (mcg) of folic acid every day.  If you want to prevent pregnancy, talk to your health care provider about birth control (contraception). Osteoporosis and menopause  Osteoporosis is a disease in which the bones lose minerals and strength with aging. This can result in serious bone fractures. Your risk for osteoporosis can be identified using a bone density scan.  If you are 40 years of age or older, or if you are at risk for osteoporosis and fractures, ask your health care provider if you should be screened.  Ask your health care provider whether you should take a calcium or vitamin D supplement to lower your risk for osteoporosis.  Menopause may have certain physical symptoms and risks.  Hormone replacement therapy may reduce some of these symptoms and risks. Talk to your health care provider about whether hormone replacement therapy is right for you. Follow these instructions at home:  Schedule regular health, dental, and eye  exams.  Stay current with your immunizations.  Do not use any tobacco products including cigarettes, chewing tobacco, or electronic cigarettes.  If you are pregnant, do not drink alcohol.  If you are breastfeeding, limit how much and how often you drink alcohol.  Limit alcohol intake to no more than 1 drink per day for nonpregnant women. One drink equals 12 ounces of beer, 5 ounces of wine, or 1 ounces of hard liquor.  Do not use street drugs.  Do not share needles.  Ask your health care provider for help if you need support or information about quitting drugs.  Tell your health care provider if you often feel depressed.  Tell your health care provider if you have ever been abused or do not feel safe at home. This information is not intended to replace advice given to you by your health care provider. Make sure you discuss any questions you have with your health care provider. Document Released: 12/30/2010 Document Revised: 11/22/2015 Document Reviewed: 03/20/2015 Elsevier Interactive Patient Education  2019 [employer].

## 2018-12-06 NOTE — Assessment & Plan Note (Signed)
Nasal congestion consistent with allergies Has been taking Claritin-D as needed-discussed that this could possibly increase her blood pressure and she may need to switch to plain Claritin, but should ideally take on a regular basis for true benefit Can try saline nasal spray, Flonase She will be trying a sea moss natural remedy for allergies first

## 2018-12-06 NOTE — Assessment & Plan Note (Signed)
Check lipid panel, cmp, tsh Regular exercise and healthy diet encouraged   

## 2018-12-06 NOTE — Assessment & Plan Note (Signed)
Mild intermittent asymptotic

## 2018-12-08 ENCOUNTER — Encounter: Payer: Self-pay | Admitting: Internal Medicine

## 2019-01-16 ENCOUNTER — Encounter: Payer: Self-pay | Admitting: Internal Medicine

## 2019-04-27 LAB — HM MAMMOGRAPHY

## 2019-05-04 ENCOUNTER — Encounter: Payer: Self-pay | Admitting: Internal Medicine

## 2019-06-07 ENCOUNTER — Ambulatory Visit: Payer: 59 | Admitting: Internal Medicine

## 2020-03-12 NOTE — Patient Instructions (Addendum)
Blood work was ordered.    All other Health Maintenance issues reviewed.   All recommended immunizations and age-appropriate screenings are up-to-date or discussed.  Flu immunization administered today.   Medications reviewed and updated.  Changes include :   none    Please followup in 6 months    Health Maintenance, Female Adopting a healthy lifestyle and getting preventive care are important in promoting health and wellness. Ask your health care provider about:  The right schedule for you to have regular tests and exams.  Things you can do on your own to prevent diseases and keep yourself healthy. What should I know about diet, weight, and exercise? Eat a healthy diet   Eat a diet that includes plenty of vegetables, fruits, low-fat dairy products, and lean protein.  Do not eat a lot of foods that are high in solid fats, added sugars, or sodium. Maintain a healthy weight Body mass index (BMI) is used to identify weight problems. It estimates body fat based on height and weight. Your health care provider can help determine your BMI and help you achieve or maintain a healthy weight. Get regular exercise Get regular exercise. This is one of the most important things you can do for your health. Most adults should:  Exercise for at least 150 minutes each week. The exercise should increase your heart rate and make you sweat (moderate-intensity exercise).  Do strengthening exercises at least twice a week. This is in addition to the moderate-intensity exercise.  Spend less time sitting. Even light physical activity can be beneficial. Watch cholesterol and blood lipids Have your blood tested for lipids and cholesterol at 54 years of age, then have this test every 5 years. Have your cholesterol levels checked more often if:  Your lipid or cholesterol levels are high.  You are older than 54 years of age.  You are at high risk for heart disease. What should I know about cancer  screening? Depending on your health history and family history, you may need to have cancer screening at various ages. This may include screening for:  Breast cancer.  Cervical cancer.  Colorectal cancer.  Skin cancer.  Lung cancer. What should I know about heart disease, diabetes, and high blood pressure? Blood pressure and heart disease  High blood pressure causes heart disease and increases the risk of stroke. This is more likely to develop in people who have high blood pressure readings, are of African descent, or are overweight.  Have your blood pressure checked: ? Every 3-5 years if you are 18-39 years of age. ? Every year if you are 40 years old or older. Diabetes Have regular diabetes screenings. This checks your fasting blood sugar level. Have the screening done:  Once every three years after age 40 if you are at a normal weight and have a low risk for diabetes.  More often and at a younger age if you are overweight or have a high risk for diabetes. What should I know about preventing infection? Hepatitis B If you have a higher risk for hepatitis B, you should be screened for this virus. Talk with your health care provider to find out if you are at risk for hepatitis B infection. Hepatitis C Testing is recommended for:  Everyone born from 1945 through 1965.  Anyone with known risk factors for hepatitis C. Sexually transmitted infections (STIs)  Get screened for STIs, including gonorrhea and chlamydia, if: ? You are sexually active and are younger than 54 years of   age. ? You are older than 54 years of age and your health care provider tells you that you are at risk for this type of infection. ? Your sexual activity has changed since you were last screened, and you are at increased risk for chlamydia or gonorrhea. Ask your health care provider if you are at risk.  Ask your health care provider about whether you are at high risk for HIV. Your health care provider may  recommend a prescription medicine to help prevent HIV infection. If you choose to take medicine to prevent HIV, you should first get tested for HIV. You should then be tested every 3 months for as long as you are taking the medicine. Pregnancy  If you are about to stop having your period (premenopausal) and you may become pregnant, seek counseling before you get pregnant.  Take 400 to 800 micrograms (mcg) of folic acid every day if you become pregnant.  Ask for birth control (contraception) if you want to prevent pregnancy. Osteoporosis and menopause Osteoporosis is a disease in which the bones lose minerals and strength with aging. This can result in bone fractures. If you are 65 years old or older, or if you are at risk for osteoporosis and fractures, ask your health care provider if you should:  Be screened for bone loss.  Take a calcium or vitamin D supplement to lower your risk of fractures.  Be given hormone replacement therapy (HRT) to treat symptoms of menopause. Follow these instructions at home: Lifestyle  Do not use any products that contain nicotine or tobacco, such as cigarettes, e-cigarettes, and chewing tobacco. If you need help quitting, ask your health care provider.  Do not use street drugs.  Do not share needles.  Ask your health care provider for help if you need support or information about quitting drugs. Alcohol use  Do not drink alcohol if: ? Your health care provider tells you not to drink. ? You are pregnant, may be pregnant, or are planning to become pregnant.  If you drink alcohol: ? Limit how much you use to 0-1 drink a day. ? Limit intake if you are breastfeeding.  Be aware of how much alcohol is in your drink. In the [place] one drink equals one 12 oz bottle of beer (355 mL), one 5 oz glass of wine (148 mL), or one 1 oz glass of hard liquor (44 mL). General instructions  Schedule regular health, dental, and eye exams.  Stay current with your  vaccines.  Tell your health care provider if: ? You often feel depressed. ? You have ever been abused or do not feel safe at home. Summary  Adopting a healthy lifestyle and getting preventive care are important in promoting health and wellness.  Follow your health care provider's instructions about healthy diet, exercising, and getting tested or screened for diseases.  Follow your health care provider's instructions on monitoring your cholesterol and blood pressure. This information is not intended to replace advice given to you by your health care provider. Make sure you discuss any questions you have with your health care provider. Document Revised: 06/09/2018 Document Reviewed: 06/09/2018 Elsevier Patient Education  2020 Elsevier Inc.  

## 2020-03-12 NOTE — Progress Notes (Signed)
Subjective:    Patient ID: Alexis Hartman, female    DOB: 11-17-1965, 54 y.o.   MRN: [id number]  HPI She is here for a physical exam.   She feels well and has no concerns.  She has lost weight since she was here last - she is still exercising regularly and changed her eating habits.   Medications and allergies reviewed with patient and updated if appropriate.  Patient Active Problem List   Diagnosis Date Noted  . Elevated blood pressure reading 12/06/2018  . Xerosis of skin 09/13/2018  . Diabetes (HCC) 12/02/2017  . Asthma 11/28/2016  . 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 Family history of diabetes mellitus (DM) 11/28/2016  . 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 HYPERCHOLESTEROLEMIA 08/18/2010  . Allergic rhinitis 06/30/2007    Current Outpatient Medications on File Prior to Visit  Medication Sig Dispense Refill  . acetaminophen (TYLENOL) 500 MG tablet Take 1,000 mg by mouth every 6 (six) hours as needed for moderate pain.    . Cholecalciferol (VITAMIN D) 2000 UNITS CAPS Take 1 capsule by mouth daily.    . Multiple Vitamin (MULTIVITAMIN) tablet Take 1 tablet by mouth daily.      . Omega-3 Fatty Acids (OMEGA 3 PO) Take by mouth daily.    . Polyethylene Glycol 3350 (MIRALAX PO) Take by mouth as needed.     No current facility-administered medications on file prior to visit.    Past Medical History:  Diagnosis Date  . Acute bronchitis   . Allergic rhinitis, cause unspecified   . Asthma   . Atypical face pain   . Cervicalgia   . Diabetes mellitus without complication (HCC)    dx by MD, but no medications taken   . Esophageal reflux    hx of  . Headache(784.0)   . Other convulsions    pseudo seizures  2002- none since then  . Pure hypercholesterolemia    no prescription medications taken    Past Surgical History:  Procedure Laterality Date  . ABDOMINAL HYSTERECTOMY    . CYSTECTOMY  01/2004   Dr. Eda Paschal  . LAPAROSCOPIC CHOLECYSTECTOMY  05/2010    Dr. Abbey Chatters  . LUMBAR LAMINECTOMY/DECOMPRESSION MICRODISCECTOMY Left 01/07/2017   Procedure: LEFT SIDED LUMBAR 5-SACRUM 1 MICRODISCECTOMY;  Surgeon: Estill Bamberg, MD;  Location: [facility] OR;  Service: Orthopedics;  Laterality: Left;  LEFT SIDED LUMBAR 5-SACRUM 1 MICRODISCECTOMY; REQUEST 2 HOURS AND FLIP ROOM  . VESICOVAGINAL FISTULA CLOSURE W/ TAH  01/2004   Dr. Eda Paschal    Social History   Socioeconomic History  . Marital status: Married    Spouse name: Not on file  . Number of children: 3  . Years of education: Not on file  . Highest education level: Not on file  Occupational History  . Occupation: Event organiser: [employer]  Tobacco Use  . Smoking status: Never Smoker  . Smokeless tobacco: Never Used  Vaping Use  . Vaping Use: Never used  Substance and Sexual Activity  . Alcohol use: No    Alcohol/week: 0.0 standard drinks  . Drug use: No  . Sexual activity: Not on file  Other Topics Concern  . Not on file  Social History Narrative  . Not on file   Social Determinants of Health   Financial Resource Strain:   . Difficulty of Paying Living Expenses: Not on file  Food Insecurity:   . Worried About Programme researcher, broadcasting/film/video in the Last Year: Not on file  .  Ran Out of Food in the Last Year: Not on file  Transportation Needs:   . Lack of Transportation (Medical): Not on file  . Lack of Transportation (Non-Medical): Not on file  Physical Activity:   . Days of Exercise per Week: Not on file  . Minutes of Exercise per Session: Not on file  Stress:   . Feeling of Stress : Not on file  Social Connections:   . Frequency of Communication with Friends and Family: Not on file  . Frequency of Social Gatherings with Friends and Family: Not on file  . Attends Religious Services: Not on file  . Active Member of Clubs or Organizations: Not on file  . Attends Banker Meetings: Not on file  . Marital Status: Not on file    Family History  Problem Relation Age  of Onset  . Diabetes Father   . Hypertension Mother   . Diabetes Mother   . Alcohol abuse Mother        in remission  . Stroke Mother   . Kidney disease Mother   . Asthma Sister   . Depression Paternal Uncle   . Hyperlipidemia Maternal Grandmother   . Colon cancer Neg Hx   . Esophageal cancer Neg Hx   . Stomach cancer Neg Hx   . Rectal cancer Neg Hx     Review of Systems  Constitutional: Negative for chills and fever.  HENT: Positive for congestion (mild - ? change in weather).   Eyes: Negative for visual disturbance.  Respiratory: Positive for shortness of breath (a little with exercise and going up stairs - h/o exercise induced asthma). Negative for cough and wheezing.   Cardiovascular: Negative for chest pain, palpitations and leg swelling.  Gastrointestinal: Negative for abdominal pain, blood in stool, constipation, diarrhea and nausea.       No gerd  Genitourinary: Negative for dysuria and hematuria.  Musculoskeletal: Positive for arthralgias (ankles from prior strain - weak). Negative for back pain.       Mild costochondritis at times  Skin: Negative for rash.       Xerosis of hands  Neurological: Positive for numbness (left leg since back surgery - ache and numbness) and headaches (occ). Negative for dizziness.  Psychiatric/Behavioral: Negative for dysphoric mood. The patient is not nervous/anxious.        Objective:   Vitals:   03/13/20 0814  BP: 118/80  Pulse: (!) 56  Temp: 98.2 F (36.8 C)  SpO2: 97%   Filed Weights   03/13/20 0814  Weight: 174 lb 6.4 oz (79.1 kg)   Body mass index is 25.02 kg/m.  BP Readings from Last 3 Encounters:  03/13/20 118/80  12/06/18 (!) 162/98  09/13/18 140/86    Wt Readings from Last 3 Encounters:  03/13/20 174 lb 6.4 oz (79.1 kg)  12/06/18 201 lb 12.8 oz (91.5 kg)  09/13/18 206 lb 12.8 oz (93.8 kg)     Physical Exam Constitutional: She appears well-developed and well-nourished. No distress.  HENT:  Head:  Normocephalic and atraumatic.  Right Ear: External ear normal. Normal ear canal and TM Left Ear: External ear normal.  Normal ear canal and TM Mouth/Throat: Oropharynx is clear and moist.  Eyes: Conjunctivae and EOM are normal.  Neck: Neck supple. No tracheal deviation present. No thyromegaly present.  No carotid bruit  Cardiovascular: Normal rate, regular rhythm and normal heart sounds.   No murmur heard.  No edema. Pulmonary/Chest: Effort normal and breath sounds normal. No respiratory  distress. She has no wheezes. She has no rales.  Breast: deferred   Abdominal: Soft. She exhibits no distension. There is no tenderness.  Lymphadenopathy: She has no cervical adenopathy.  Skin: Skin is warm and dry. She is not diaphoretic.  Psychiatric: She has a normal mood and affect. Her behavior is normal.        Assessment & Plan:   Physical exam: Screening blood work    ordered Immunizations  had Covid, flu vaccine today, discussed shingrix Colonoscopy  Up to date  Mammogram  Up to date  Gyn  Due - will schedule Eye exams  Up to date Exercise  Regular HIIT Weight  normal Substance abuse  none  See Problem List for Assessment and Plan of chronic medical problems.   This visit occurred during the SARS-CoV-2 public health emergency.  Safety protocols were in place, including screening questions prior to the visit, additional usage of staff PPE, and extensive cleaning of exam room while observing appropriate contact time as indicated for disinfecting solutions.

## 2020-03-13 ENCOUNTER — Encounter: Payer: Self-pay | Admitting: Internal Medicine

## 2020-03-13 ENCOUNTER — Other Ambulatory Visit: Payer: Self-pay

## 2020-03-13 ENCOUNTER — Ambulatory Visit (INDEPENDENT_AMBULATORY_CARE_PROVIDER_SITE_OTHER): Payer: 59 | Admitting: Internal Medicine

## 2020-03-13 VITALS — BP 118/80 | HR 56 | Temp 98.2°F | Ht 70.0 in | Wt 174.4 lb

## 2020-03-13 DIAGNOSIS — E7800 Pure hypercholesterolemia, unspecified: Secondary | ICD-10-CM | POA: Diagnosis not present

## 2020-03-13 DIAGNOSIS — Z0000 Encounter for general adult medical examination without abnormal findings: Secondary | ICD-10-CM | POA: Diagnosis not present

## 2020-03-13 DIAGNOSIS — E119 Type 2 diabetes mellitus without complications: Secondary | ICD-10-CM

## 2020-03-13 DIAGNOSIS — J4520 Mild intermittent asthma, uncomplicated: Secondary | ICD-10-CM

## 2020-03-13 DIAGNOSIS — Z23 Encounter for immunization: Secondary | ICD-10-CM

## 2020-03-13 MED ORDER — ALBUTEROL SULFATE HFA 108 (90 BASE) MCG/ACT IN AERS: 2.0000 | INHALATION_SPRAY | Freq: Four times a day (QID) | RESPIRATORY_TRACT | 11 refills | Status: DC | PRN

## 2020-03-13 NOTE — Assessment & Plan Note (Addendum)
Chronic Diet controlled Has lost weight Check a1c, urine micro Low sugar / carb diet Continue regular exercise

## 2020-03-13 NOTE — Assessment & Plan Note (Signed)
Chronic Check lipid panel  Did not tolerate statin in the past - leg pain Regular exercise and healthy diet encouraged

## 2020-03-13 NOTE — Assessment & Plan Note (Signed)
H/o exercise induced asthma Gets some SOB at times w/ exericse Will rx albuterol to use prn

## 2020-03-14 LAB — COMPREHENSIVE METABOLIC PANEL
AG Ratio: 1.7 (calc) (ref 1.0–2.5)
ALT: 23 U/L (ref 6–29)
AST: 33 U/L (ref 10–35)
Albumin: 4.9 g/dL (ref 3.6–5.1)
Alkaline phosphatase (APISO): 54 U/L (ref 37–153)
BUN: 16 mg/dL (ref 7–25)
CO2: 30 mmol/L (ref 20–32)
Calcium: 10.5 mg/dL — ABNORMAL HIGH (ref 8.6–10.4)
Chloride: 105 mmol/L (ref 98–110)
Creat: 0.99 mg/dL (ref 0.50–1.05)
Globulin: 2.9 g/dL (calc) (ref 1.9–3.7)
Glucose, Bld: 92 mg/dL (ref 65–99)
Potassium: 4.5 mmol/L (ref 3.5–5.3)
Sodium: 141 mmol/L (ref 135–146)
Total Bilirubin: 0.7 mg/dL (ref 0.2–1.2)
Total Protein: 7.8 g/dL (ref 6.1–8.1)

## 2020-03-14 LAB — LIPID PANEL
Cholesterol: 271 mg/dL — ABNORMAL HIGH (ref <200)
HDL: 104 mg/dL (ref >=50)
LDL Cholesterol (Calc): 149 mg/dL (calc) — ABNORMAL HIGH
Non-HDL Cholesterol (Calc): 167 mg/dL (calc) — ABNORMAL HIGH (ref <130)
Total CHOL/HDL Ratio: 2.6 (calc) (ref <5.0)
Triglycerides: 74 mg/dL (ref <150)

## 2020-03-14 LAB — CBC WITH DIFFERENTIAL/PLATELET
Absolute Monocytes: 442 cells/uL (ref 200–950)
Basophils Absolute: 54 cells/uL (ref 0–200)
Basophils Relative: 0.8 %
Eosinophils Absolute: 94 cells/uL (ref 15–500)
Eosinophils Relative: 1.4 %
HCT: 40.3 % (ref 35.0–45.0)
Hemoglobin: 13.4 g/dL (ref 11.7–15.5)
Lymphs Abs: 3229 cells/uL (ref 850–3900)
MCH: 29.1 pg (ref 27.0–33.0)
MCHC: 33.3 g/dL (ref 32.0–36.0)
MCV: 87.4 fL (ref 80.0–100.0)
MPV: 11.4 fL (ref 7.5–12.5)
Monocytes Relative: 6.6 %
Neutro Abs: 2881 cells/uL (ref 1500–7800)
Neutrophils Relative %: 43 %
Platelets: 233 10*3/uL (ref 140–400)
RBC: 4.61 10*6/uL (ref 3.80–5.10)
RDW: 13.2 % (ref 11.0–15.0)
Total Lymphocyte: 48.2 %
WBC: 6.7 10*3/uL (ref 3.8–10.8)

## 2020-03-14 LAB — MICROALBUMIN / CREATININE URINE RATIO
Creatinine, Urine: 175 mg/dL (ref 20–275)
Microalb Creat Ratio: 3 mcg/mg creat (ref <30)
Microalb, Ur: 0.6 mg/dL

## 2020-03-14 LAB — HEMOGLOBIN A1C
Hgb A1c MFr Bld: 6 % of total Hgb — ABNORMAL HIGH (ref <5.7)
Mean Plasma Glucose: 126 (calc)
eAG (mmol/L): 7 (calc)

## 2020-03-14 LAB — TSH: TSH: 1.14 mIU/L

## 2020-04-12 LAB — HM MAMMOGRAPHY

## 2020-05-10 ENCOUNTER — Encounter: Payer: Self-pay | Admitting: Internal Medicine

## 2020-05-10 NOTE — Progress Notes (Unsigned)
Outside notes received. Information abstracted. Notes sent to scan.  

## 2020-09-09 NOTE — Patient Instructions (Addendum)
    Blood work was ordered.      Medications changes include :   none     Please followup in 6 months  

## 2020-09-09 NOTE — Progress Notes (Signed)
Subjective:    Patient ID: Alexis Hartman, female    DOB: 1966-05-10, 55 y.o.   MRN: [id number]  HPI The patient is here for follow up of their chronic medical problems, including DM, hyperlipidemia, asthma   She is exercising regularly.    She has not been eating as well with her mom passing away recently.   She has gained a little weight back.  She has had some sob with steps.  She can do intervals at the gym and have no difficulty.  She does fairly intense workouts and again denies shortness of breath with those.  She denies any chest pain.   Medications and allergies reviewed with patient and updated if appropriate.  Patient Active Problem List   Diagnosis Date Noted  . Xerosis of skin 09/13/2018  . Diabetes (HCC) 12/02/2017  . Asthma 11/28/2016  . Pseudoseizures (HCC) 11/28/2016  . Family history of diabetes mellitus (DM) 11/28/2016  . 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 HYPERCHOLESTEROLEMIA 08/18/2010  . Allergic rhinitis 06/30/2007    Current Outpatient Medications on File Prior to Visit  Medication Sig Dispense Refill  . acetaminophen (TYLENOL) 500 MG tablet Take 1,000 mg by mouth every 6 (six) hours as needed for moderate pain.    Marland Kitchen albuterol (VENTOLIN HFA) 108 (90 Base) MCG/ACT inhaler Inhale 2 puffs into the lungs every 6 (six) hours as needed for wheezing or shortness of breath. 8 g 11  . Cholecalciferol (VITAMIN D) 2000 UNITS CAPS Take 1 capsule by mouth daily.    . Multiple Vitamin (MULTIVITAMIN) tablet Take 1 tablet by mouth daily.    . Omega-3 Fatty Acids (OMEGA 3 PO) Take by mouth daily.    . Polyethylene Glycol 3350 (MIRALAX PO) Take by mouth as needed.     No current facility-administered medications on file prior to visit.    Past Medical History:  Diagnosis Date  . Acute bronchitis   . Allergic rhinitis, cause unspecified   . Asthma   . Atypical face pain   . Cervicalgia   . Diabetes mellitus without complication (HCC)    dx  by MD, but no medications taken   . Esophageal reflux    hx of  . Headache(784.0)   . Other convulsions    pseudo seizures  2002- none since then  . Pure hypercholesterolemia    no prescription medications taken    Past Surgical History:  Procedure Laterality Date  . ABDOMINAL HYSTERECTOMY    . CYSTECTOMY  01/2004   Dr. Eda Paschal  . LAPAROSCOPIC CHOLECYSTECTOMY  05/2010   Dr. Abbey Chatters  . LUMBAR LAMINECTOMY/DECOMPRESSION MICRODISCECTOMY Left 01/07/2017   Procedure: LEFT SIDED LUMBAR 5-SACRUM 1 MICRODISCECTOMY;  Surgeon: Estill Bamberg, MD;  Location: [facility] OR;  Service: Orthopedics;  Laterality: Left;  LEFT SIDED LUMBAR 5-SACRUM 1 MICRODISCECTOMY; REQUEST 2 HOURS AND FLIP ROOM  . VESICOVAGINAL FISTULA CLOSURE W/ TAH  01/2004   Dr. Eda Paschal    Social History   Socioeconomic History  . Marital status: Married    Spouse name: Not on file  . Number of children: 3  . Years of education: Not on file  . Highest education level: Not on file  Occupational History  . Occupation: Event organiser: [employer]  Tobacco Use  . Smoking status: Never Smoker  . Smokeless tobacco: Never Used  Vaping Use  . Vaping Use: Never used  Substance and Sexual Activity  . Alcohol use: No    Alcohol/week: 0.0 standard  drinks  . Drug use: No  . Sexual activity: Not on file  Other Topics Concern  . Not on file  Social History Narrative  . Not on file   Social Determinants of Health   Financial Resource Strain: Not on file  Food Insecurity: Not on file  Transportation Needs: Not on file  Physical Activity: Not on file  Stress: Not on file  Social Connections: Not on file    Family History  Problem Relation Age of Onset  . Diabetes Father   . Hypertension Mother   . Diabetes Mother   . Alcohol abuse Mother        in remission  . Stroke Mother   . Kidney disease Mother   . Asthma Sister   . Depression Paternal Uncle   . Hyperlipidemia Maternal Grandmother   . Colon cancer  Neg Hx   . Esophageal cancer Neg Hx   . Stomach cancer Neg Hx   . Rectal cancer Neg Hx     Review of Systems  Constitutional: Negative for chills and fever.  Respiratory: Positive for cough (occ - clears mucus) and shortness of breath (stairs only). Negative for wheezing.   Cardiovascular: Negative for chest pain, palpitations and leg swelling.  Musculoskeletal:       Cramping in left leg - from back  Neurological: Positive for numbness (left leg numb - from back). Negative for light-headedness and headaches.       Objective:   Vitals:   09/11/20 0757  BP: 126/72  Pulse: 80  Temp: 98.2 F (36.8 C)  SpO2: 96%   BP Readings from Last 3 Encounters:  09/11/20 126/72  03/13/20 118/80  12/06/18 (!) 162/98   Wt Readings from Last 3 Encounters:  09/11/20 189 lb (85.7 kg)  03/13/20 174 lb 6.4 oz (79.1 kg)  12/06/18 201 lb 12.8 oz (91.5 kg)   Body mass index is 27.12 kg/m.   Physical Exam    Constitutional: Appears well-developed and well-nourished. No distress.  HENT:  Head: Normocephalic and atraumatic.  Neck: Neck supple. No tracheal deviation present. No thyromegaly present.  No cervical lymphadenopathy Cardiovascular: Normal rate, regular rhythm and normal heart sounds.   No murmur heard. No carotid bruit .  No edema Pulmonary/Chest: Effort normal and breath sounds normal. No respiratory distress. No has no wheezes. No rales.  Skin: Skin is warm and dry. Not diaphoretic.  Psychiatric: Normal mood and affect. Behavior is normal.      Assessment & Plan:    See Problem List for Assessment and Plan of chronic medical problems.    This visit occurred during the SARS-CoV-2 public health emergency.  Safety protocols were in place, including screening questions prior to the visit, additional usage of staff PPE, and extensive cleaning of exam room while observing appropriate contact time as indicated for disinfecting solutions.

## 2020-09-11 ENCOUNTER — Encounter: Payer: Self-pay | Admitting: Internal Medicine

## 2020-09-11 ENCOUNTER — Ambulatory Visit (INDEPENDENT_AMBULATORY_CARE_PROVIDER_SITE_OTHER): Payer: 59 | Admitting: Internal Medicine

## 2020-09-11 ENCOUNTER — Other Ambulatory Visit: Payer: Self-pay

## 2020-09-11 VITALS — BP 126/72 | HR 80 | Temp 98.2°F | Ht 70.0 in | Wt 189.0 lb

## 2020-09-11 DIAGNOSIS — E7800 Pure hypercholesterolemia, unspecified: Secondary | ICD-10-CM

## 2020-09-11 DIAGNOSIS — R0602 Shortness of breath: Secondary | ICD-10-CM | POA: Diagnosis not present

## 2020-09-11 DIAGNOSIS — E1169 Type 2 diabetes mellitus with other specified complication: Secondary | ICD-10-CM

## 2020-09-11 DIAGNOSIS — J4520 Mild intermittent asthma, uncomplicated: Secondary | ICD-10-CM | POA: Diagnosis not present

## 2020-09-11 LAB — LIPID PANEL
Cholesterol: 257 mg/dL — ABNORMAL HIGH (ref 0–200)
HDL: 90.9 mg/dL (ref >39.00)
LDL Cholesterol: 151 mg/dL — ABNORMAL HIGH (ref 0–99)
NonHDL: 165.99
Total CHOL/HDL Ratio: 3
Triglycerides: 75 mg/dL (ref 0.0–149.0)
VLDL: 15 mg/dL (ref 0.0–40.0)

## 2020-09-11 LAB — COMPREHENSIVE METABOLIC PANEL
ALT: 18 U/L (ref 0–35)
AST: 25 U/L (ref 0–37)
Albumin: 4.7 g/dL (ref 3.5–5.2)
Alkaline Phosphatase: 54 U/L (ref 39–117)
BUN: 15 mg/dL (ref 6–23)
CO2: 31 mEq/L (ref 19–32)
Calcium: 10.4 mg/dL (ref 8.4–10.5)
Chloride: 103 mEq/L (ref 96–112)
Creatinine, Ser: 0.93 mg/dL (ref 0.40–1.20)
GFR: 69.49 mL/min (ref >60.00)
Glucose, Bld: 93 mg/dL (ref 70–99)
Potassium: 4.2 mEq/L (ref 3.5–5.1)
Sodium: 141 mEq/L (ref 135–145)
Total Bilirubin: 0.5 mg/dL (ref 0.2–1.2)
Total Protein: 8 g/dL (ref 6.0–8.3)

## 2020-09-11 LAB — HEMOGLOBIN A1C: Hgb A1c MFr Bld: 6.1 % (ref 4.6–6.5)

## 2020-09-11 NOTE — Assessment & Plan Note (Signed)
chronic Diet controlled Well controlled Check a1c Continue regular exercise Will improve diet

## 2020-09-11 NOTE — Assessment & Plan Note (Signed)
chronic Mild, intermittent Uses albuterol prn only Has had some SOB with stairs only - not other exercise ? Related to asthma Will try albuterol to see if that helps

## 2020-09-11 NOTE — Assessment & Plan Note (Signed)
Chronic Check lipid panel  Diet controlled Regular exercise and healthy diet encouraged  

## 2020-09-11 NOTE — Assessment & Plan Note (Addendum)
Acute Only occurs with going up stairs.  She denies any shortness of breath with 30 minutes of cardio or interval training She denies any chronic cough or wheezing, chest pain or palpitations Unlikely cardiac related or asthma related given that she has no shortness of breath with her other activities Shortness of breath could be related to going up stairs only Advised that she could try her albuterol inhaler 1 day to see if that helps any She will monitor

## 2021-03-14 ENCOUNTER — Encounter: Payer: 59 | Admitting: Internal Medicine

## 2021-04-12 ENCOUNTER — Ambulatory Visit (INDEPENDENT_AMBULATORY_CARE_PROVIDER_SITE_OTHER): Payer: 59 | Admitting: Internal Medicine

## 2021-04-12 ENCOUNTER — Encounter: Payer: Self-pay | Admitting: Internal Medicine

## 2021-04-12 ENCOUNTER — Other Ambulatory Visit: Payer: Self-pay

## 2021-04-12 VITALS — BP 128/74 | HR 71 | Temp 98.2°F | Ht 70.0 in | Wt 190.0 lb

## 2021-04-12 DIAGNOSIS — J4520 Mild intermittent asthma, uncomplicated: Secondary | ICD-10-CM

## 2021-04-12 DIAGNOSIS — E7800 Pure hypercholesterolemia, unspecified: Secondary | ICD-10-CM

## 2021-04-12 DIAGNOSIS — Z0000 Encounter for general adult medical examination without abnormal findings: Secondary | ICD-10-CM | POA: Diagnosis not present

## 2021-04-12 DIAGNOSIS — F445 Conversion disorder with seizures or convulsions: Secondary | ICD-10-CM

## 2021-04-12 DIAGNOSIS — E1169 Type 2 diabetes mellitus with other specified complication: Secondary | ICD-10-CM

## 2021-04-12 DIAGNOSIS — Z23 Encounter for immunization: Secondary | ICD-10-CM

## 2021-04-12 DIAGNOSIS — Z1331 Encounter for screening for depression: Secondary | ICD-10-CM | POA: Diagnosis not present

## 2021-04-12 DIAGNOSIS — Z1159 Encounter for screening for other viral diseases: Secondary | ICD-10-CM

## 2021-04-12 DIAGNOSIS — R569 Unspecified convulsions: Secondary | ICD-10-CM

## 2021-04-12 DIAGNOSIS — Z8639 Personal history of other endocrine, nutritional and metabolic disease: Secondary | ICD-10-CM

## 2021-04-12 LAB — COMPREHENSIVE METABOLIC PANEL
ALT: 22 U/L (ref 0–35)
AST: 28 U/L (ref 0–37)
Albumin: 4.8 g/dL (ref 3.5–5.2)
Alkaline Phosphatase: 50 U/L (ref 39–117)
BUN: 20 mg/dL (ref 6–23)
CO2: 27 mEq/L (ref 19–32)
Calcium: 10.6 mg/dL — ABNORMAL HIGH (ref 8.4–10.5)
Chloride: 104 mEq/L (ref 96–112)
Creatinine, Ser: 1.03 mg/dL (ref 0.40–1.20)
GFR: 61.22 mL/min (ref >60.00)
Glucose, Bld: 99 mg/dL (ref 70–99)
Potassium: 4.6 mEq/L (ref 3.5–5.1)
Sodium: 140 mEq/L (ref 135–145)
Total Bilirubin: 0.4 mg/dL (ref 0.2–1.2)
Total Protein: 8.1 g/dL (ref 6.0–8.3)

## 2021-04-12 LAB — CBC WITH DIFFERENTIAL/PLATELET
Basophils Absolute: 0.1 10*3/uL (ref 0.0–0.1)
Basophils Relative: 1.1 % (ref 0.0–3.0)
Eosinophils Absolute: 0.1 10*3/uL (ref 0.0–0.7)
Eosinophils Relative: 1.1 % (ref 0.0–5.0)
HCT: 40.1 % (ref 36.0–46.0)
Hemoglobin: 13.2 g/dL (ref 12.0–15.0)
Lymphocytes Relative: 46.2 % — ABNORMAL HIGH (ref 12.0–46.0)
Lymphs Abs: 2.8 10*3/uL (ref 0.7–4.0)
MCHC: 32.9 g/dL (ref 30.0–36.0)
MCV: 86.6 fl (ref 78.0–100.0)
Monocytes Absolute: 0.4 10*3/uL (ref 0.1–1.0)
Monocytes Relative: 5.9 % (ref 3.0–12.0)
Neutro Abs: 2.8 10*3/uL (ref 1.4–7.7)
Neutrophils Relative %: 45.7 % (ref 43.0–77.0)
Platelets: 266 10*3/uL (ref 150.0–400.0)
RBC: 4.62 Mil/uL (ref 3.87–5.11)
RDW: 13.9 % (ref 11.5–15.5)
WBC: 6.1 10*3/uL (ref 4.0–10.5)

## 2021-04-12 LAB — MICROALBUMIN / CREATININE URINE RATIO
Creatinine,U: 189.5 mg/dL
Microalb Creat Ratio: 0.4 mg/g (ref 0.0–30.0)
Microalb, Ur: <0.7 mg/dL (ref 0.0–1.9)

## 2021-04-12 LAB — LIPID PANEL
Cholesterol: 255 mg/dL — ABNORMAL HIGH (ref 0–200)
HDL: 84.5 mg/dL (ref >39.00)
LDL Cholesterol: 160 mg/dL — ABNORMAL HIGH (ref 0–99)
NonHDL: 170.96
Total CHOL/HDL Ratio: 3
Triglycerides: 56 mg/dL (ref 0.0–149.0)
VLDL: 11.2 mg/dL (ref 0.0–40.0)

## 2021-04-12 LAB — HEMOGLOBIN A1C: Hgb A1c MFr Bld: 6.3 % (ref 4.6–6.5)

## 2021-04-12 LAB — TSH: TSH: 0.74 u[IU]/mL (ref 0.35–5.50)

## 2021-04-12 NOTE — Assessment & Plan Note (Signed)
Chronic Check lipid panel Has not tolerated statins in the past due to leg pain Lifestyle controlled

## 2021-04-12 NOTE — Assessment & Plan Note (Signed)
Chronic With hyperlipidemia Diet controlled Check A1c, urine microalbumin Continue diabetic diet, regular exercise

## 2021-04-12 NOTE — Patient Instructions (Addendum)
Flu immunization and shingles administered today.     Blood work was ordered.     Medications changes include :   none    Please followup in 6 months    Health Maintenance, Female Adopting a healthy lifestyle and getting preventive care are important in promoting health and wellness. Ask your health care provider about: The right schedule for you to have regular tests and exams. Things you can do on your own to prevent diseases and keep yourself healthy. What should I know about diet, weight, and exercise? Eat a healthy diet  Eat a diet that includes plenty of vegetables, fruits, low-fat dairy products, and lean protein. Do not eat a lot of foods that are high in solid fats, added sugars, or sodium. Maintain a healthy weight Body mass index (BMI) is used to identify weight problems. It estimates body fat based on height and weight. Your health care provider can help determine your BMI and help you achieve or maintain a healthy weight. Get regular exercise Get regular exercise. This is one of the most important things you can do for your health. Most adults should: Exercise for at least 150 minutes each week. The exercise should increase your heart rate and make you sweat (moderate-intensity exercise). Do strengthening exercises at least twice a week. This is in addition to the moderate-intensity exercise. Spend less time sitting. Even light physical activity can be beneficial. Watch cholesterol and blood lipids Have your blood tested for lipids and cholesterol at 55 years of age, then have this test every 5 years. Have your cholesterol levels checked more often if: Your lipid or cholesterol levels are high. You are older than 55 years of age. You are at high risk for heart disease. What should I know about cancer screening? Depending on your health history and family history, you may need to have cancer screening at various ages. This may include screening for: Breast  cancer. Cervical cancer. Colorectal cancer. Skin cancer. Lung cancer. What should I know about heart disease, diabetes, and high blood pressure? Blood pressure and heart disease High blood pressure causes heart disease and increases the risk of stroke. This is more likely to develop in people who have high blood pressure readings, are of African descent, or are overweight. Have your blood pressure checked: Every 3-5 years if you are 26-49 years of age. Every year if you are 49 years old or older. Diabetes Have regular diabetes screenings. This checks your fasting blood sugar level. Have the screening done: Once every three years after age 54 if you are at a normal weight and have a low risk for diabetes. More often and at a younger age if you are overweight or have a high risk for diabetes. What should I know about preventing infection? Hepatitis B If you have a higher risk for hepatitis B, you should be screened for this virus. Talk with your health care provider to find out if you are at risk for hepatitis B infection. Hepatitis C Testing is recommended for: Everyone born from [birth year] through 1965. Anyone with known risk factors for hepatitis C. Sexually transmitted infections (STIs) Get screened for STIs, including gonorrhea and chlamydia, if: You are sexually active and are younger than 56 years of age. You are older than 55 years of age and your health care provider tells you that you are at risk for this type of infection. Your sexual activity has changed since you were last screened, and you are at increased  risk for chlamydia or gonorrhea. Ask your health care provider if you are at risk. Ask your health care provider about whether you are at high risk for HIV. Your health care provider may recommend a prescription medicine to help prevent HIV infection. If you choose to take medicine to prevent HIV, you should first get tested for HIV. You should then be tested every 3 months for as  long as you are taking the medicine. Pregnancy If you are about to stop having your period (premenopausal) and you may become pregnant, seek counseling before you get pregnant. Take 400 to 800 micrograms (mcg) of folic acid every day if you become pregnant. Ask for birth control (contraception) if you want to prevent pregnancy. Osteoporosis and menopause Osteoporosis is a disease in which the bones lose minerals and strength with aging. This can result in bone fractures. If you are 71 years old or older, or if you are at risk for osteoporosis and fractures, ask your health care provider if you should: Be screened for bone loss. Take a calcium or vitamin D supplement to lower your risk of fractures. Be given hormone replacement therapy (HRT) to treat symptoms of menopause. Follow these instructions at home: Lifestyle Do not use any products that contain nicotine or tobacco, such as cigarettes, e-cigarettes, and chewing tobacco. If you need help quitting, ask your health care provider. Do not use street drugs. Do not share needles. Ask your health care provider for help if you need support or information about quitting drugs. Alcohol use Do not drink alcohol if: Your health care provider tells you not to drink. You are pregnant, may be pregnant, or are planning to become pregnant. If you drink alcohol: Limit how much you use to 0-1 drink a day. Limit intake if you are breastfeeding. Be aware of how much alcohol is in your drink. In the [place] one drink equals one 12 oz bottle of beer (355 mL), one 5 oz glass of wine (148 mL), or one 1 oz glass of hard liquor (44 mL). General instructions Schedule regular health, dental, and eye exams. Stay current with your vaccines. Tell your health care provider if: You often feel depressed. You have ever been abused or do not feel safe at home. Summary Adopting a healthy lifestyle and getting preventive care are important in promoting health and  wellness. Follow your health care provider's instructions about healthy diet, exercising, and getting tested or screened for diseases. Follow your health care provider's instructions on monitoring your cholesterol and blood pressure. This information is not intended to replace advice given to you by your health care provider. Make sure you discuss any questions you have with your health care provider. Document Revised: 08/24/2020 Document Reviewed: 06/09/2018 Elsevier Patient Education  2022 [employer].

## 2021-04-12 NOTE — Progress Notes (Signed)
Subjective:    Patient ID: Alexis Hartman, female    DOB: [birth year], 55 y.o.   MRN: [id number]   This visit occurred during the SARS-CoV-2 public health emergency.  Safety protocols were in place, including screening questions prior to the visit, additional usage of staff PPE, and extensive cleaning of exam room while observing appropriate contact time as indicated for disinfecting solutions.    HPI She is here for a physical exam.   Nothing new.  Doing great.  She re-twisted her right ankle.   She wears a brace.  She knows the exercises to do.    Medications and allergies reviewed with patient and updated if appropriate.  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     albuterol (VENTOLIN HFA) 108 (90 Base) MCG/ACT inhaler Inhale 2 puffs into the lungs every 6 (six) hours as needed for wheezing or shortness of breath. 8 g 11   Cholecalciferol (VITAMIN D) 2000 UNITS CAPS Take 1 capsule by mouth daily.     Multiple Vitamin (MULTIVITAMIN) tablet Take 1 tablet by mouth daily.     Omega-3 Fatty Acids (OMEGA 3 PO) Take by mouth daily.     Polyethylene Glycol 3350 (MIRALAX PO) Take by mouth as needed.     No current facility-administered medications on file prior to visit.    Past Medical History:  Diagnosis Date   Acute bronchitis    Allergic rhinitis, cause unspecified    Asthma    Atypical face pain    Cervicalgia    Diabetes mellitus without complication (HCC)    dx by MD, but no medications taken    Esophageal  reflux    hx of   Headache(784.0)    Other convulsions    pseudo seizures  2002- none since then   Pure hypercholesterolemia    no prescription medications taken    Past Surgical History:  Procedure Laterality Date   ABDOMINAL HYSTERECTOMY     CYSTECTOMY  01/2004   Dr. Eda Paschal   LAPAROSCOPIC CHOLECYSTECTOMY  05/2010   Dr. Abbey Chatters   LUMBAR LAMINECTOMY/DECOMPRESSION MICRODISCECTOMY Left 01/07/2017   Procedure: LEFT SIDED LUMBAR 5-SACRUM 1 MICRODISCECTOMY;  Surgeon: Estill Bamberg, MD;  Location: [facility] OR;  Service: Orthopedics;  Laterality: Left;  LEFT SIDED LUMBAR 5-SACRUM 1 MICRODISCECTOMY; REQUEST 2 HOURS AND FLIP ROOM   VESICOVAGINAL FISTULA CLOSURE W/ TAH  01/2004   Dr. Eda Paschal    Social History   Socioeconomic History   Marital status: Married    Spouse name: Not on file   Number of children: 3   Years of education: Not on file   Highest education level: Not on file  Occupational History   Occupation: Event organiser: [employer]  Tobacco Use   Smoking status: Never   Smokeless tobacco: Never  Vaping Use   Vaping Use: Never used  Substance and Sexual Activity   Alcohol use: No    Alcohol/week: 0.0 standard drinks   Drug use: No   Sexual activity: Not on file  Other Topics Concern   Not on file  Social History Narrative   Not on file   Social Determinants of Health   Financial Resource Strain: Not on file  Food Insecurity: Not on file  Transportation Needs: Not on file  Physical Activity: Not on file  Stress: Not on file  Social Connections: Not on file    Family History  Problem Relation Age of Onset   Diabetes Father    Hypertension Mother    Diabetes Mother    Alcohol abuse Mother        in remission   Stroke Mother    Kidney disease Mother    Asthma Sister    Depression Paternal Uncle    Hyperlipidemia Maternal Grandmother    Colon cancer Neg Hx    Esophageal cancer Neg Hx    Stomach cancer Neg Hx    Rectal cancer Neg Hx      Review of Systems  Constitutional:  Negative for chills and fever.  Eyes:  Negative for visual disturbance.  Respiratory:  Negative for cough, shortness of breath and wheezing.   Cardiovascular:  Positive for palpitations (rare). Negative for chest pain and leg swelling.  Gastrointestinal:  Negative for abdominal pain, blood in stool, constipation, diarrhea and nausea.       No gerd  Genitourinary:  Negative for dysuria and frequency.  Musculoskeletal:  Positive for arthralgias (right ankle -sprain, left shoulder) and back pain.  Skin:  Negative for rash.  Neurological:  Positive for light-headedness (occ) and headaches (occ).  Psychiatric/Behavioral:  Negative for dysphoric mood. The patient is not nervous/anxious.       Objective:   Vitals:   04/12/21 0800  BP: 128/74  Pulse: 71  Temp: 98.2 F (36.8 C)  SpO2: 96%   Filed Weights   04/12/21 0800  Weight: 190 lb (86.2 kg)   Body mass index is 27.26 kg/m.  BP Readings from Last 3 Encounters:  04/12/21 128/74  09/11/20 126/72  03/13/20 118/80    Wt Readings from Last 3 Encounters:  04/12/21 190 lb (86.2 kg)  09/11/20 189 lb (85.7 kg)  03/13/20 174 lb 6.4 oz (79.1 kg)    Depression screen [facility] 2/9 04/12/2021 03/13/2020 12/06/2018 12/02/2017 05/13/2013  Decreased Interest 0 0 0 0 0  Down, Depressed, Hopeless 0 0 0 0 0  PHQ - 2 Score 0 0 0 0 0  Altered sleeping 0 - - - 3  Tired, decreased energy 0 - - - 2  Change in appetite 0 - - - 0  Feeling bad or failure about yourself  0 - - - 0  Trouble concentrating 0 - - - 1  Moving slowly or fidgety/restless 0 - - - 2  Suicidal thoughts 0 - - - 0  PHQ-9 Score 0 - - - 8    GAD 7 : Generalized Anxiety Score 04/12/2021  Nervous, Anxious, on Edge 0  Control/stop worrying 0  Worry too much - different things 0  Trouble relaxing 0  Restless 0  Easily annoyed or irritable 0  Afraid - awful might happen 0  Total GAD 7 Score 0       Physical Exam Constitutional: She  appears well-developed and well-nourished. No distress.  HENT:  Head: Normocephalic and atraumatic.  Right Ear: External ear normal. Normal ear canal and TM Left Ear: External ear normal.  Normal ear canal and TM Mouth/Throat: Oropharynx is clear and moist.  Eyes: Conjunctivae and EOM are normal.  Neck: Neck  supple. No tracheal deviation present. No thyromegaly present.  No carotid bruit  Cardiovascular: Normal rate, regular rhythm and normal heart sounds.   No murmur heard.  No edema. Pulmonary/Chest: Effort normal and breath sounds normal. No respiratory distress. She has no wheezes. She has no rales.  Breast: deferred   Abdominal: Soft. She exhibits no distension. There is no tenderness.  Lymphadenopathy: She has no cervical adenopathy.  Skin: Skin is warm and dry. She is not diaphoretic.  Psychiatric: She has a normal mood and affect. Her behavior is normal.     Lab Results  Component Value Date   WBC 6.7 03/13/2020   HGB 13.4 03/13/2020   HCT 40.3 03/13/2020   PLT 233 03/13/2020   GLUCOSE 93 09/11/2020   CHOL 257 (H) 09/11/2020   TRIG 75.0 09/11/2020   HDL 90.90 09/11/2020   LDLDIRECT 164.7 05/13/2013   LDLCALC 151 (H) 09/11/2020   ALT 18 09/11/2020   AST 25 09/11/2020   NA 141 09/11/2020   K 4.2 09/11/2020   CL 103 09/11/2020   CREATININE 0.93 09/11/2020   BUN 15 09/11/2020   CO2 31 09/11/2020   TSH 1.14 03/13/2020   INR 1.04 01/05/2017   HGBA1C 6.1 09/11/2020   MICROALBUR 0.6 03/13/2020         Assessment & Plan:   Physical exam: Screening blood work  ordered Exercise  regular Weight  working on weight loss Substance abuse  none   Screened for depression using the PHQ 9 scale.  No evidence of depression.   Screened for anxiety using GAD7 Scale.  No evidence of anxiety.   Reviewed recommended immunizations.   Health Maintenance  Topic Date Due   OPHTHALMOLOGY EXAM  Never done   Hepatitis C Screening  Never done   PAP SMEAR-Modifier  Never  done   FOOT EXAM  12/06/2019   URINE MICROALBUMIN  03/13/2021   MAMMOGRAM  04/12/2021   HEMOGLOBIN A1C  03/14/2021   COVID-19 Vaccine (1) 04/28/2021 (Originally 04/17/1966)   HIV Screening  12/05/2028 (Originally 10/16/1980)   Zoster Vaccines- Shingrix (2 of 2) 06/07/2021   TETANUS/TDAP  11/29/2026   COLONOSCOPY (Pts 45-66yrs Insurance coverage will need to be confirmed)  03/16/2028   INFLUENZA VACCINE  Completed   HPV VACCINES  Aged Out          See Problem List for Assessment and Plan of chronic medical problems.

## 2021-04-12 NOTE — Assessment & Plan Note (Signed)
Chronic Mild, intermittent Controlled Continue albuterol inhaler as needed 

## 2021-04-12 NOTE — Assessment & Plan Note (Signed)
Neuro w/u in past neg rare

## 2021-04-15 LAB — HEPATITIS C ANTIBODY
Hepatitis C Ab: NONREACTIVE
SIGNAL TO CUT-OFF: 0.12 (ref <1.00)

## 2021-04-15 LAB — IRON,TIBC AND FERRITIN PANEL
%SAT: 19 % (calc) (ref 16–45)
Ferritin: 90 ng/mL (ref 16–232)
Iron: 70 ug/dL (ref 45–160)
TIBC: 378 mcg/dL (calc) (ref 250–450)

## 2021-10-13 ENCOUNTER — Encounter: Payer: Self-pay | Admitting: Internal Medicine

## 2021-10-13 NOTE — Progress Notes (Signed)
? ? ? ? ?Subjective:  ? ? Patient ID: Alexis Hartman, female    DOB: 1965/12/13, 56 y.o.   MRN: [id number] ? ?This visit occurred during the SARS-CoV-2 public health emergency.  Safety protocols were in place, including screening questions prior to the visit, additional usage of staff PPE, and extensive cleaning of exam room while observing appropriate contact time as indicated for disinfecting solutions.   ? ? ?HPI ?Alexis Hartman is here for follow up of her chronic medical problems, including DM, hld, asthma ? ?She is exercising regularly.   She is eating good.   ? ?Medications and allergies reviewed with patient and updated if appropriate. ? ?Current Outpatient Medications on File Prior to Visit  ?Medication Sig Dispense Refill  ? acetaminophen (TYLENOL) 500 MG tablet Take 1,000 mg by mouth every 6 (six) hours as needed for moderate pain.    ? albuterol (VENTOLIN HFA) 108 (90 Base) MCG/ACT inhaler Inhale 2 puffs into the lungs every 6 (six) hours as needed for wheezing or shortness of breath. 8 g 11  ? Cholecalciferol (VITAMIN D) 2000 UNITS CAPS Take 1 capsule by mouth daily.    ? Multiple Vitamin (MULTIVITAMIN) tablet Take 1 tablet by mouth daily.    ? Omega-3 Fatty Acids (OMEGA 3 PO) Take by mouth daily.    ? Polyethylene Glycol 3350 (MIRALAX PO) Take by mouth as needed.    ? ?No current facility-administered medications on file prior to visit.  ? ? ? ?Review of Systems  ?Constitutional:  Negative for chills and fever.  ?Respiratory:  Positive for cough (occ). Negative for shortness of breath and wheezing.   ?Cardiovascular:  Positive for palpitations (occ). Negative for chest pain and leg swelling.  ?Musculoskeletal:   ?     Left cramp in calf  ?Neurological:  Positive for light-headedness and headaches.  ?Psychiatric/Behavioral:  Negative for dysphoric mood. The patient is not nervous/anxious.   ? ?   ?Objective:  ? ?Vitals:  ? 10/14/21 0746  ?BP: 126/82  ?Pulse: (!) 52  ?Temp: 98 ?F (36.7 ?C)  ?SpO2: 100%  ? ?BP  Readings from Last 3 Encounters:  ?10/14/21 126/82  ?04/12/21 128/74  ?09/11/20 126/72  ? ?Wt Readings from Last 3 Encounters:  ?10/14/21 184 lb (83.5 kg)  ?04/12/21 190 lb (86.2 kg)  ?09/11/20 189 lb (85.7 kg)  ? ?Body mass index is 26.4 kg/m?. ? ?  ?Physical Exam ?Constitutional:   ?   General: She is not in acute distress. ?   Appearance: Normal appearance.  ?HENT:  ?   Head: Normocephalic and atraumatic.  ?Eyes:  ?   Conjunctiva/sclera: Conjunctivae normal.  ?Cardiovascular:  ?   Rate and Rhythm: Normal rate and regular rhythm.  ?   Heart sounds: Normal heart sounds. No murmur heard. ?Pulmonary:  ?   Effort: Pulmonary effort is normal. No respiratory distress.  ?   Breath sounds: Normal breath sounds. No wheezing.  ?Musculoskeletal:  ?   Cervical back: Neck supple.  ?   Right lower leg: No edema.  ?   Left lower leg: No edema.  ?Lymphadenopathy:  ?   Cervical: No cervical adenopathy.  ?Skin: ?   General: Skin is warm and dry.  ?   Findings: No rash.  ?Neurological:  ?   Mental Status: She is alert. Mental status is at baseline.  ?Psychiatric:     ?   Mood and Affect: Mood normal.     ?   Behavior: Behavior normal.  ? ?   ? ?  Lab Results  ?Component Value Date  ? WBC 6.1 04/12/2021  ? HGB 13.2 04/12/2021  ? HCT 40.1 04/12/2021  ? PLT 266.0 04/12/2021  ? GLUCOSE 99 04/12/2021  ? CHOL 255 (H) 04/12/2021  ? TRIG 56.0 04/12/2021  ? HDL 84.50 04/12/2021  ? LDLDIRECT 164.7 05/13/2013  ? LDLCALC 160 (H) 04/12/2021  ? ALT 22 04/12/2021  ? AST 28 04/12/2021  ? NA 140 04/12/2021  ? K 4.6 04/12/2021  ? CL 104 04/12/2021  ? CREATININE 1.03 04/12/2021  ? BUN 20 04/12/2021  ? CO2 27 04/12/2021  ? TSH 0.74 04/12/2021  ? INR 1.04 01/05/2017  ? HGBA1C 6.3 04/12/2021  ? MICROALBUR <0.7 04/12/2021  ? ? ? ?Assessment & Plan:  ? ? ?See Problem List for Assessment and Plan of chronic medical problems.  ? ? ?

## 2021-10-13 NOTE — Patient Instructions (Addendum)
? ? ? ?  Blood work was ordered.   ? ? ?Medications changes include :   None ? ? ? ?A referral was ordered for podiatry.     Someone from that office will call you to schedule an appointment.  ? ? ?Return in about 6 months (around 04/15/2022) for CPE. ? ?

## 2021-10-14 ENCOUNTER — Ambulatory Visit (INDEPENDENT_AMBULATORY_CARE_PROVIDER_SITE_OTHER): Payer: 59 | Admitting: Internal Medicine

## 2021-10-14 VITALS — BP 126/82 | HR 52 | Temp 98.0°F | Ht 70.0 in | Wt 184.0 lb

## 2021-10-14 DIAGNOSIS — J309 Allergic rhinitis, unspecified: Secondary | ICD-10-CM | POA: Diagnosis not present

## 2021-10-14 DIAGNOSIS — E1169 Type 2 diabetes mellitus with other specified complication: Secondary | ICD-10-CM

## 2021-10-14 DIAGNOSIS — Z23 Encounter for immunization: Secondary | ICD-10-CM | POA: Diagnosis not present

## 2021-10-14 DIAGNOSIS — J4520 Mild intermittent asthma, uncomplicated: Secondary | ICD-10-CM | POA: Diagnosis not present

## 2021-10-14 DIAGNOSIS — T466X5A Adverse effect of antihyperlipidemic and antiarteriosclerotic drugs, initial encounter: Secondary | ICD-10-CM | POA: Insufficient documentation

## 2021-10-14 DIAGNOSIS — E7800 Pure hypercholesterolemia, unspecified: Secondary | ICD-10-CM | POA: Diagnosis not present

## 2021-10-14 DIAGNOSIS — R208 Other disturbances of skin sensation: Secondary | ICD-10-CM | POA: Insufficient documentation

## 2021-10-14 LAB — COMPREHENSIVE METABOLIC PANEL
ALT: 20 U/L (ref 0–35)
AST: 31 U/L (ref 0–37)
Albumin: 4.7 g/dL (ref 3.5–5.2)
Alkaline Phosphatase: 56 U/L (ref 39–117)
BUN: 16 mg/dL (ref 6–23)
CO2: 28 mEq/L (ref 19–32)
Calcium: 9.8 mg/dL (ref 8.4–10.5)
Chloride: 107 mEq/L (ref 96–112)
Creatinine, Ser: 1.03 mg/dL (ref 0.40–1.20)
GFR: 61 mL/min (ref >60.00)
Glucose, Bld: 102 mg/dL — ABNORMAL HIGH (ref 70–99)
Potassium: 4 mEq/L (ref 3.5–5.1)
Sodium: 143 mEq/L (ref 135–145)
Total Bilirubin: 0.4 mg/dL (ref 0.2–1.2)
Total Protein: 7.9 g/dL (ref 6.0–8.3)

## 2021-10-14 LAB — LIPID PANEL
Cholesterol: 235 mg/dL — ABNORMAL HIGH (ref 0–200)
HDL: 83.1 mg/dL (ref >39.00)
LDL Cholesterol: 140 mg/dL — ABNORMAL HIGH (ref 0–99)
NonHDL: 152.29
Total CHOL/HDL Ratio: 3
Triglycerides: 59 mg/dL (ref 0.0–149.0)
VLDL: 11.8 mg/dL (ref 0.0–40.0)

## 2021-10-14 LAB — HEMOGLOBIN A1C: Hgb A1c MFr Bld: 6.4 % (ref 4.6–6.5)

## 2021-10-14 NOTE — Assessment & Plan Note (Signed)
Chronic ?Check lipid panel, CMP ?Myalgias secondary to statins in the past ?Would like to avoid medication ?Recommended red yeast rice ?Continue regular exercise, healthy diet ?

## 2021-10-14 NOTE — Assessment & Plan Note (Signed)
New ?In feet ?? Neuropathy ?Also has other foot concerns ?Referred to podiatry ?

## 2021-10-14 NOTE — Assessment & Plan Note (Signed)
Chronic ?Intermittent symptoms ?Takes claritin as needed ?

## 2021-10-14 NOTE — Addendum Note (Signed)
Addended by: Karma Ganja on: 10/14/2021 08:41 AM ? ? Modules accepted: Orders ? ?

## 2021-10-14 NOTE — Assessment & Plan Note (Signed)
Chronic ?Mild, intermittent ?Continue albuterol inhaler as needed, which she uses infrequently ?

## 2021-10-14 NOTE — Assessment & Plan Note (Signed)
Chronic ?Lab Results  ?Component Value Date  ? HGBA1C 6.3 04/12/2021  ? ?Sugars controlled ?Check A1c ?Continue diet control ?Continue regular exercise, diabetic diet ? ? ?

## 2021-10-23 ENCOUNTER — Ambulatory Visit (INDEPENDENT_AMBULATORY_CARE_PROVIDER_SITE_OTHER): Payer: 59 | Admitting: Podiatry

## 2021-10-23 ENCOUNTER — Encounter: Payer: Self-pay | Admitting: Podiatry

## 2021-10-23 DIAGNOSIS — G63 Polyneuropathy in diseases classified elsewhere: Secondary | ICD-10-CM | POA: Diagnosis not present

## 2021-10-23 DIAGNOSIS — B351 Tinea unguium: Secondary | ICD-10-CM

## 2021-10-23 MED ORDER — CICLOPIROX 8 % EX SOLN: Freq: Every day | CUTANEOUS | 0 refills | Status: DC

## 2021-10-23 NOTE — Progress Notes (Signed)
?  Subjective:  ?Patient ID: Alexis Hartman, female    DOB: [birth year],   MRN: [id number] ? ?Chief Complaint  ?Patient presents with  ? Foot Pain  ?  Bilateral decrease sensation   ? ? ?56 y.o. female presents for concern of decreased sensation in bilateral feet. Relates the worst part is her feet always feel cold. Relates a history of back issues as well as pre-diabetes. Last A1c was 6.4. She also relates right great toe nail pain and thickness after going to the salon.  Denies any other pedal complaints. Denies n/v/f/c.  ? ?Past Medical History:  ?Diagnosis Date  ? Acute bronchitis   ? Allergic rhinitis, cause unspecified   ? Asthma   ? Atypical face pain   ? Cervicalgia   ? Diabetes mellitus without complication (HCC)   ? dx by MD, but no medications taken   ? Esophageal reflux   ? hx of  ? Headache(784.0)   ? Other convulsions   ? pseudo seizures  2002- none since then  ? Pure hypercholesterolemia   ? no prescription medications taken  ? ? ?Objective:  ?Physical Exam: ?Vascular: DP/PT pulses 2/4 bilateral. CFT <3 seconds. Normal hair growth on digits. No edema.  ?Skin. No lacerations or abrasions bilateral feet. Right hallux nail thickened and discolored with subungual debris distally.  ?Musculoskeletal: MMT 5/5 bilateral lower extremities in DF, PF, Inversion and Eversion. Deceased ROM in DF of ankle joint.  ?Neurological: Sensation intact to light touch. Protective sensation diminished.  ? ?Assessment:  ? ?1. Polyneuropathy associated with underlying disease (HCC)   ?2. Onychomycosis   ? ? ? ?Plan:  ?Patient was evaluated and treated and all questions answered. ?Discussed neuropathy and etiology as well as treatment with patient.  ?Radiographs reviewed and discussed with patient.  ?-Discussed and educated patient on diabetic foot care, especially with  ?regards to the vascular, neurological and musculoskeletal systems.  ?-Stressed the importance of good glycemic control and the detriment of not  ?controlling  glucose levels in relation to the foot. ?-Discussed supportive shoes at all times and checking feet regularly.  ?-Follow-up with PCP for prescription management of gabapentin if needed. .  ?-Prescription for penlac provided for fungal nail.  ?-Patient to return in 6 months for follow-up evaluation.  ? ? ?Louann Sjogren, DPM  ? ? ?

## 2021-11-01 NOTE — Therapy (Addendum)
OUTPATIENT PHYSICAL THERAPY LOWER EXTREMITY EVALUATION   Patient Name: FATIMATA MURIN MRN: [id number] DOB:Jun 06, 1966, 56 y.o., female Today's Date: 11/01/2021    Past Medical History:  Diagnosis Date   Acute bronchitis    Allergic rhinitis, cause unspecified    Asthma    Atypical face pain    Cervicalgia    Diabetes mellitus without complication ([place])    dx by MD, but no medications taken    Esophageal reflux    hx of   Headache(784.0)    Other convulsions    pseudo seizures  2002- none since then   Pure hypercholesterolemia    no prescription medications taken   Past Surgical History:  Procedure Laterality Date   ABDOMINAL HYSTERECTOMY     CYSTECTOMY  01/2004   Dr. Cherylann Banas   LAPAROSCOPIC CHOLECYSTECTOMY  05/2010   Dr. Zella Richer   LUMBAR LAMINECTOMY/DECOMPRESSION MICRODISCECTOMY Left 01/07/2017   Procedure: LEFT SIDED LUMBAR 5-SACRUM 1 MICRODISCECTOMY;  Surgeon: Phylliss Bob, MD;  Location: [facility];  Service: Orthopedics;  Laterality: Left;  LEFT SIDED LUMBAR 5-SACRUM 1 MICRODISCECTOMY; REQUEST 2 HOURS AND FLIP ROOM   VESICOVAGINAL FISTULA CLOSURE W/ TAH  01/2004   Dr. 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  ONSET DATE: 4 years ago  SUBJECTIVE:   SUBJECTIVE STATEMENT: Pt reports primary c/o BIL global foot N/T with associated toe cramping lasting about 4 years, which she reports may be associated with her back pain and lumbar  laminectomy on 01/07/2017. However, she had N/T prior to her surgery.  She reports that her primary pain is in Rt great toe and global foot, while her entire Lt LE "stays numb," and she adds that the Lt leg feels weaker than the Rt. She also reports that one year ago, she had a pedicure which resulted in exacerbation of her Rt great toe. The pt is pre-diabetic, but reports she does not think this has been causing her foot symptoms. The pt also reports that she had inversion ankle sprains to BIL ankles two years ago while running, during which she reports she tore ligaments in her ankles, although she never had corrective surgery. She reports that she has noticed an association between increased back tightness and increased LE symptoms. She also reports that she has tightness along her Lt buttocks (points to piriformis), and when she presses on this area, it helps her sxs. Current pain: 2/10 in Lt leg and Rt foot. Worst pain: 10/10. Best pain is 2/10. Aggravating factors include working out (dead lifting, loading back), prolonged standing >45 minutes, sleeping at night (increases Lt thigh pain), laying on Lt side. Easing factors include inversion table, massage, heating pad, elevating legs.   PERTINENT HISTORY: Lumbar laminectomy 01/07/2017, Pre-diabetic  PAIN:  Are you having pain? Yes: NPRS scale: 2/10 Pain location: Rt foot, Lt global LE Pain description: dull, achy, numb, tingling Aggravating factors: working out (dead lifting, loading back), prolonged standing >45 minutes, sleeping at night (increases Lt thigh pain), laying on Lt  side Relieving factors: inversion table, massage, heating pad, elevating legs  PRECAUTIONS: None  WEIGHT BEARING RESTRICTIONS No  FALLS:  Has patient fallen in last 6 months? No  LIVING ENVIRONMENT: Lives with: lives with their family Lives in: House/apartment Stairs: Yes: Internal: 15 steps; on right going up, on left going up, and can reach both Has following  equipment at home: None  OCCUPATION: Pt works at [employer], sitting most of day  PLOF: Independent  PATIENT GOALS: Work out with less pain, decrease frequency/ intensity of pain   OBJECTIVE:   DIAGNOSTIC FINDINGS: None related to current problem  PATIENT SURVEYS:  FOTO 76%, projected 80% in 10 visits  COGNITION:  Overall cognitive status: Within functional limits for tasks assessed     SENSATION: Light touch: L5-S2 diminished on Lt vs Rt  MUSCLE LENGTH: Hamstrings: Moderate tightness BIL Gastrocnemius: Moderate tightness BIL Soleus: Moderate tightness BIL  POSTURE:  BIL pes cavus  PALPATION: TTP to Lt piriformis with increased Lt sided N/T, TTP to BIL lumbar paraspinals  PASSIVE ACCESSORIES: Lumbar CPA's painful throughout with induced muscle spasms  ROM:   A/PROM Right 11/01/2021 Left 11/01/2021  Lumbar extension WNL  Lumbar flexion WNL  Lumbar side flexion 50% WNL  Lumbar rotation 50% WNL  Ankle dorsiflexion 10/11 5/10  Ankle plantarflexion 45/48 45/48  Ankle inversion 38/42 40/50  Ankle eversion 5/30 -5/30   (Blank rows = not tested)  LE MMT:  MMT Right 11/01/2021 Left 11/01/2021  Hip flexion 5/5 4+/5  Hip extension 3/5 3/5  Hip abduction 3+/5 4/5  Knee flexion 5/5 4+/5  Knee extension 5/5 5/5  Ankle dorsiflexion 5/5 4/5  Ankle plantarflexion 5/5 5/5  Ankle inversion 5/5 5/5  Ankle eversion 3/5 2+/5   (Blank rows = not tested)  SPECIAL TESTS:  Ankle special tests: Tinel's test-Posterior tibialis: negative and Tinel's test-Deep peroneal: negative   Slump: (+) on Lt  SLR: (+) on Lt  FUNCTIONAL TESTS:  DL heel raise x10: decreased heel height with Lt leg weakness and numbness    TODAY'S TREATMENT: 11/02/2021: Issued and demonstrated HEP   PATIENT EDUCATION:  Education details: Pt educated on potential underlying pathophysiology contributing to her pain presentation, POC, prognosis, FOTO, and HEP Person educated: Patient Education method:  Explanation, Demonstration, and Handouts Education comprehension: verbalized understanding and returned demonstration   HOME EXERCISE PROGRAM: Access Code: [email or web address] URL: [email or web address] Date: 11/01/2021 Prepared by: Vanessa [place]  Exercises - Standing Heel Raise with Support  - 1 x daily - 7 x weekly - 3 sets - 10 reps - 3-sec hold - Gastroc Stretch on Wall  - 1 x daily - 7 x weekly - 2-min hold - Soleus Stretch on Wall  - 1 x daily - 7 x weekly - 2-min hold - Seated Toe Towel Scrunches  - 1 x daily - 7 x weekly - 3 sets - 10 reps - Seated Slump Nerve Glide  - 1 x daily - 7 x weekly - 3 sets - 20 reps  ASSESSMENT:  CLINICAL IMPRESSION: Patient is a 56 y.o. F who was seen today for physical therapy evaluation and treatment for chronic Rt foot pain and Lt global LE N/T. Upon assessment, the pt's primary impairments include weak BIL ankle eversion MMT, weak Lt ankle DF and knee flexion MMT, weak BIL global hips, impaired light touch sensation on Lt L5-S2 dermatomes, impaired functional heel raise, tight calf musculature, tight hamstrings, TTP to Lt piriformis with increased Lt sided N/T, TTP to BIL lumbar paraspinals,  and limited BIL ankle eversion AROM. Ruling up residual ankle weakness following ankle sprains over 1 year ago due to painful and limited BIL eversion strength and ROM, along with excessive inversion ROM. Also ruling up Lt sided lumbar radiculopathy due to positive slump and SLR special testing, hx of radicular symptoms dating prior to her lumbar laminectomy and resuming shortly after the surgery, and Lt sided LE weakness. Cannot rule out piriformis syndrome involvement due to TTP to Lt sided piriformis with reproduction of Lt sided N/T, although pt reports no symptom exacerbation with prolonged sitting which decreases this likelihood. Pt will benefit from skilled PT to address her primary impairments and return to her prior level of function with less  limitation.   OBJECTIVE IMPAIRMENTS Abnormal gait, decreased balance, decreased coordination, decreased endurance, decreased mobility, difficulty walking, decreased ROM, decreased strength, hypomobility, increased edema, impaired flexibility, impaired sensation, improper body mechanics, postural dysfunction, and pain.   ACTIVITY LIMITATIONS cleaning, community activity, driving, occupation, Medical sales representative, yard work, and shopping.   PERSONAL FACTORS Time since onset of injury/illness/exacerbation and 1-2 comorbidities: Lumbar laminectomy 01/07/2017, Pre-diabetic  are also affecting patient's functional outcome.    REHAB POTENTIAL: Good  CLINICAL DECISION MAKING: Evolving/moderate complexity  EVALUATION COMPLEXITY: Moderate   GOALS: Goals reviewed with patient? Yes  SHORT TERM GOALS: Target date: 11/30/2021  Pt will report understanding and adherence to her HEP in order to promote independence in the management of her primary impairments. Baseline: HEP provided at eval Goal status: INITIAL   LONG TERM GOALS: Target date: 12/28/2021  Pt will achieve a FOTO score of 80% in order to demonstrate improved functional ability as it relates to her feet/ ankle impairments. Baseline: 76% Goal status: INITIAL  2.  Pt will achieve BIL global ankle strength of 5/5 in order to progress to walking with less limitation. Baseline:  See MMT chart Goal status: INITIAL  3.  Pt will report ability to stand > 60 minutes with 0-2/10 pain in order to go grocery shopping with less limitation. Baseline: 45 minutes with >4/10 pain Goal status: INITIAL  4.  Pt will achieve a 25 DL heel raises with good form in order to progress to a running program with less limitation. Baseline: x10 with increased pain and diminished form Goal status: INITIAL     PLAN: PT FREQUENCY: 1-2x/week  PT DURATION: 8 weeks  PLANNED INTERVENTIONS: Therapeutic exercises, Therapeutic activity, Neuromuscular re-education, Balance  training, Gait training, Patient/Family education, Joint manipulation, Joint mobilization, Stair training, Orthotic/Fit training, Aquatic Therapy, Dry Needling, Electrical stimulation, Cryotherapy, Moist heat, Taping, Vasopneumatic device, Biofeedback, Ionotophoresis 4mg /ml Dexamethasone, and Manual therapy  PLAN FOR NEXT SESSION: Progress global LE strengthening/ stretching, along with desensitization/ manual techniques for symptom management   Cherie Ouch, PT 11/01/2021, 12:47 PM  Vanessa [place], PT, DPT 12/05/21 11:28 AM

## 2021-11-02 ENCOUNTER — Ambulatory Visit: Payer: 59 | Attending: Podiatry

## 2021-11-02 ENCOUNTER — Other Ambulatory Visit: Payer: Self-pay

## 2021-11-02 DIAGNOSIS — M79672 Pain in left foot: Secondary | ICD-10-CM | POA: Insufficient documentation

## 2021-11-02 DIAGNOSIS — M6281 Muscle weakness (generalized): Secondary | ICD-10-CM | POA: Diagnosis present

## 2021-11-02 DIAGNOSIS — G63 Polyneuropathy in diseases classified elsewhere: Secondary | ICD-10-CM | POA: Diagnosis not present

## 2021-11-02 DIAGNOSIS — M79671 Pain in right foot: Secondary | ICD-10-CM | POA: Diagnosis present

## 2021-11-02 DIAGNOSIS — R202 Paresthesia of skin: Secondary | ICD-10-CM | POA: Diagnosis present

## 2021-11-02 DIAGNOSIS — R262 Difficulty in walking, not elsewhere classified: Secondary | ICD-10-CM | POA: Insufficient documentation

## 2021-11-07 NOTE — Therapy (Signed)
?OUTPATIENT PHYSICAL THERAPY TREATMENT NOTE ? ? ?Patient Name: Alexis Hartman ?MRN: [id number] ?DOB:12/12/1965, 56 y.o., female ?Today's Date: 11/09/2021 ? ?PCP: Pincus Sanes, MD ?REFERRING PROVIDER: Louann Sjogren, DPM ? ?END OF SESSION:  ? PT End of Session - 11/09/21 1029   ? ? Visit Number 2   ? Number of Visits 17   ? Date for PT Re-Evaluation 01/04/22   ? Authorization Type [employer]   ? Authorization Time Period FOTO v6, v10   ? Progress Note Due on Visit 10   ? PT Start Time 1030   ? PT Stop Time 1115   ? PT Time Calculation (min) 45 min   ? Activity Tolerance Patient tolerated treatment well   ? Behavior During Therapy [facility] for tasks assessed/performed   ? ?  ?  ? ?  ? ? ?Past Medical History:  ?Diagnosis Date  ? Acute bronchitis   ? Allergic rhinitis, cause unspecified   ? Asthma   ? Atypical face pain   ? Cervicalgia   ? Diabetes mellitus without complication ([facility])   ? dx by MD, but no medications taken   ? Esophageal reflux   ? hx of  ? Headache(784.0)   ? Other convulsions   ? pseudo seizures  2002- none since then  ? Pure hypercholesterolemia   ? no prescription medications taken  ? ?Past Surgical History:  ?Procedure Laterality Date  ? ABDOMINAL HYSTERECTOMY    ? CYSTECTOMY  01/2004  ? Dr. Eda Paschal  ? LAPAROSCOPIC CHOLECYSTECTOMY  05/2010  ? Dr. Abbey Chatters  ? LUMBAR LAMINECTOMY/DECOMPRESSION MICRODISCECTOMY Left 01/07/2017  ? Procedure: LEFT SIDED LUMBAR 5-SACRUM 1 MICRODISCECTOMY;  Surgeon: Estill Bamberg, MD;  Location: [facility];  Service: Orthopedics;  Laterality: Left;  LEFT SIDED LUMBAR 5-SACRUM 1 MICRODISCECTOMY; REQUEST 2 HOURS AND FLIP ROOM  ? VESICOVAGINAL FISTULA CLOSURE W/ TAH  01/2004  ? Dr. Eda Paschal  ? ?Patient Active Problem List  ? Diagnosis Date Noted  ? Myalgia due to statin 10/14/2021  ? Decreased sensation 10/14/2021  ? Diabetes ([facility]) 12/02/2017  ? Asthma 11/28/2016  ? 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 Family history of diabetes mellitus (DM) 11/28/2016  ? Helicobacter pylori antibody positive  02/10/2011  ? HYPERCHOLESTEROLEMIA 08/18/2010  ? Allergic rhinitis 06/30/2007  ? ? ?REFERRING DIAG: G63 (ICD-10-CM) - Polyneuropathy associated with underlying disease ([facility]) ? ?THERAPY DIAG:  ?Pain in left foot ? ?Pain in right foot ? ?Paresthesia of skin ? ?Difficulty in walking, not elsewhere classified ? ?Muscle weakness (generalized) ? ?PERTINENT HISTORY: Lumbar laminectomy 01/07/2017, Pre-diabetic ? ?PRECAUTIONS: None ? ?ONSET DATE: 4 years ago ?  ? ?SUBJECTIVE: Patient reports pain in her L leg today. She reports that the exercises have been aggravating her LLE pain. ? ?PAIN:  ?Are you having pain? Yes: NPRS scale: 6/10 ?Pain location: Rt foot, Lt global LE ?Pain description: dull, achy, numb, tingling ?Aggravating factors: working out (dead lifting, loading back), prolonged standing >45 minutes, sleeping at night (increases Lt thigh pain), laying on Lt side ?Relieving factors: inversion table, massage, heating pad, elevating legs ? ? ?OBJECTIVE: (objective measures completed at initial evaluation unless otherwise dated) ? ? ?DIAGNOSTIC FINDINGS: None related to current problem ?  ?PATIENT SURVEYS:  ?FOTO 76%, projected 80% in 10 visits ?  ?COGNITION: ?          Overall cognitive status: Within functional limits for tasks assessed               ?           ?  SENSATION: ?Light touch: L5-S2 diminished on Lt vs Rt ?  ?MUSCLE LENGTH: ?Hamstrings: Moderate tightness BIL ?Gastrocnemius: Moderate tightness BIL ?Soleus: Moderate tightness BIL ?  ?POSTURE:  ?BIL pes cavus ?  ?PALPATION: ?TTP to Lt piriformis with increased Lt sided N/T, TTP to BIL lumbar paraspinals ?  ?PASSIVE ACCESSORIES: ?Lumbar CPA's painful throughout with induced muscle spasms ?  ?ROM: ?  ? A/PROM Right ?11/01/2021 Left ?11/01/2021  ?Lumbar extension WNL  ?Lumbar flexion WNL  ?Lumbar side flexion 50% WNL  ?Lumbar rotation 50% WNL  ?Ankle dorsiflexion 10/11 5/10  ?Ankle plantarflexion 45/48 45/48  ?Ankle inversion 38/42 40/50  ?Ankle eversion 5/30 -5/30   ? (Blank rows = not tested) ?  ?LE MMT: ?  ?MMT Right ?11/01/2021 Left ?11/01/2021  ?Hip flexion 5/5 4+/5  ?Hip extension 3/5 3/5  ?Hip abduction 3+/5 4/5  ?Knee flexion 5/5 4+/5  ?Knee extension 5/5 5/5  ?Ankle dorsiflexion 5/5 4/5  ?Ankle plantarflexion 5/5 5/5  ?Ankle inversion 5/5 5/5  ?Ankle eversion 3/5 2+/5  ? (Blank rows = not tested) ?  ?SPECIAL TESTS:  ?Ankle special tests: Tinel's test-Posterior tibialis: negative and Tinel's test-Deep peroneal: negative ?  ?           Slump: (+) on Lt ?           SLR: (+) on Lt ?  ?FUNCTIONAL TESTS:  ?DL heel raise W43: decreased heel height with Lt leg weakness and numbness ?  ?  ?  ?TODAY'S TREATMENT: ?[facility] Adult PT Treatment:                                                DATE: 11/09/2021 ?Therapeutic Exercise: ?Nustep level 6 x 5 mins ?Slant board gastroc stretch 3x30" ?Heel raises on 4" step, focus on eccentric lowering 3x10 ?Double leg heel raise with single leg eccentric lowering x10 BIL ?Cybex hip abduction/ext 17.5# 2x10 BIL ?Long sitting ankle 4 way RTB x10 BIL ?Supine hamstring stretch with strap 2x30" BIL ?Supine figure 4 stretch 2x30" BIL ?Bridges 2x10 ? ? ?11/02/2021: Issued and demonstrated HEP ?  ?  ?PATIENT EDUCATION:  ?Education details: Pt educated on potential underlying pathophysiology contributing to her pain presentation, POC, prognosis, FOTO, and HEP ?Person educated: Patient ?Education method: Explanation, Demonstration, and Handouts ?Education comprehension: verbalized understanding and returned demonstration ?  ?  ?HOME EXERCISE PROGRAM: ?Access Code: [email or web address] ?URL: [email or web address] ?Date: 11/01/2021 ?Prepared by: Carmelina Dane ?  ?Exercises ?- Standing Heel Raise with Support  - 1 x daily - 7 x weekly - 3 sets - 10 reps - 3-sec hold ?- Gastroc Stretch on Wall  - 1 x daily - 7 x weekly - 2-min hold ?- Soleus Stretch on Wall  - 1 x daily - 7 x weekly - 2-min hold ?- Seated Toe Towel Scrunches  - 1 x daily - 7 x weekly - 3 sets -  10 reps ?- Seated Slump Nerve Glide  - 1 x daily - 7 x weekly - 3 sets - 20 reps ?  ?ASSESSMENT: ?  ?CLINICAL IMPRESSION: ?Patient presents to PT with moderate pain in her LLE from hip to toes and reports no current pain in her R foot. Session today focused on proximal hip, knee, and ankle strengthening as well as hamstring and calf stretching. Patient was able to tolerate all prescribed exercises with no adverse effects.  Patient continues to benefit from skilled PT services and should be progressed as able to improve functional independence. ? ?   ?OBJECTIVE IMPAIRMENTS Abnormal gait, decreased balance, decreased coordination, decreased endurance, decreased mobility, difficulty walking, decreased ROM, decreased strength, hypomobility, increased edema, impaired flexibility, impaired sensation, improper body mechanics, postural dysfunction, and pain.  ?  ?ACTIVITY LIMITATIONS cleaning, community activity, driving, occupation, laundry, yard work, and shopping.  ?  ?PERSONAL FACTORS Time since onset of injury/illness/exacerbation and 1-2 comorbidities: Lumbar laminectomy 01/07/2017, Pre-diabetic  are also affecting patient's functional outcome.  ?  ?  ?REHAB POTENTIAL: Good ?  ?CLINICAL DECISION MAKING: Evolving/moderate complexity ?  ?EVALUATION COMPLEXITY: Moderate ?  ?  ?GOALS: ?Goals reviewed with patient? Yes ?  ?SHORT TERM GOALS: Target date: 11/30/2021 ?  ?Pt will report understanding and adherence to her HEP in order to promote independence in the management of her primary impairments. ?Baseline: HEP provided at eval ?Goal status: INITIAL ?  ?  ?LONG TERM GOALS: Target date: 12/28/2021 ?  ?Pt will achieve a FOTO score of 80% in order to demonstrate improved functional ability as it relates to her feet/ ankle impairments. ?Baseline: 76% ?Goal status: INITIAL ?  ?2.  Pt will achieve BIL global ankle strength of 5/5 in order to progress to walking with less limitation. ?Baseline:  See MMT chart ?Goal status: INITIAL ?   ?3.  Pt will report ability to stand > 60 minutes with 0-2/10 pain in order to go grocery shopping with less limitation. ?Baseline: 45 minutes with >4/10 pain ?Goal status: INITIAL ?  ?4.  Pt will a

## 2021-11-09 ENCOUNTER — Ambulatory Visit: Payer: 59

## 2021-11-09 DIAGNOSIS — M79672 Pain in left foot: Secondary | ICD-10-CM | POA: Diagnosis not present

## 2021-11-09 DIAGNOSIS — M6281 Muscle weakness (generalized): Secondary | ICD-10-CM

## 2021-11-09 DIAGNOSIS — R262 Difficulty in walking, not elsewhere classified: Secondary | ICD-10-CM

## 2021-11-09 DIAGNOSIS — M79671 Pain in right foot: Secondary | ICD-10-CM

## 2021-11-09 DIAGNOSIS — R202 Paresthesia of skin: Secondary | ICD-10-CM

## 2021-11-12 ENCOUNTER — Telehealth: Payer: Self-pay

## 2021-11-12 ENCOUNTER — Ambulatory Visit: Payer: 59

## 2021-11-12 NOTE — Telephone Encounter (Signed)
Left message for pt regarding her first no show. Discussed clinic attendance policy, confirmed next remaining appointment, and provided the clinic phone number. ?

## 2021-11-12 NOTE — Therapy (Incomplete)
?OUTPATIENT PHYSICAL THERAPY TREATMENT NOTE ? ? ?Patient Name: Alexis Hartman ?MRN: [id number] ?DOB:1966-01-10, 56 y.o., female ?Today's Date: 11/12/2021 ? ?PCP: Pincus Sanes, MD ?REFERRING PROVIDER: Louann Sjogren, DPM ? ?END OF SESSION:  ? ? ? ?Past Medical History:  ?Diagnosis Date  ? Acute bronchitis   ? Allergic rhinitis, cause unspecified   ? Asthma   ? Atypical face pain   ? Cervicalgia   ? Diabetes mellitus without complication (HCC)   ? dx by MD, but no medications taken   ? Esophageal reflux   ? hx of  ? Headache(784.0)   ? Other convulsions   ? pseudo seizures  2002- none since then  ? Pure hypercholesterolemia   ? no prescription medications taken  ? ?Past Surgical History:  ?Procedure Laterality Date  ? ABDOMINAL HYSTERECTOMY    ? CYSTECTOMY  01/2004  ? Dr. Eda Paschal  ? LAPAROSCOPIC CHOLECYSTECTOMY  05/2010  ? Dr. Abbey Chatters  ? LUMBAR LAMINECTOMY/DECOMPRESSION MICRODISCECTOMY Left 01/07/2017  ? Procedure: LEFT SIDED LUMBAR 5-SACRUM 1 MICRODISCECTOMY;  Surgeon: Estill Bamberg, MD;  Location: [facility] OR;  Service: Orthopedics;  Laterality: Left;  LEFT SIDED LUMBAR 5-SACRUM 1 MICRODISCECTOMY; REQUEST 2 HOURS AND FLIP ROOM  ? VESICOVAGINAL FISTULA CLOSURE W/ TAH  01/2004  ? Dr. Eda Paschal  ? ?Patient Active Problem List  ? Diagnosis Date Noted  ? Myalgia due to statin 10/14/2021  ? Decreased sensation 10/14/2021  ? Diabetes (HCC) 12/02/2017  ? Asthma 11/28/2016  ? 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 Family history of diabetes mellitus (DM) 11/28/2016  ? Helicobacter pylori antibody positive 02/10/2011  ? HYPERCHOLESTEROLEMIA 08/18/2010  ? Allergic rhinitis 06/30/2007  ? ? ?REFERRING DIAG: G63 (ICD-10-CM) - Polyneuropathy associated with underlying disease (HCC) ? ?THERAPY DIAG:  ?No diagnosis found. ? ?PERTINENT HISTORY: Lumbar laminectomy 01/07/2017, Pre-diabetic ? ?PRECAUTIONS: None ? ?ONSET DATE: 4 years ago ?  ? ?SUBJECTIVE: *** ? ?PAIN:  ?Are you having pain? Yes: NPRS scale: 6/10 ?Pain location: Rt foot, Lt global  LE ?Pain description: dull, achy, numb, tingling ?Aggravating factors: working out (dead lifting, loading back), prolonged standing >45 minutes, sleeping at night (increases Lt thigh pain), laying on Lt side ?Relieving factors: inversion table, massage, heating pad, elevating legs ? ? ?OBJECTIVE: (objective measures completed at initial evaluation unless otherwise dated) ? ? ?DIAGNOSTIC FINDINGS: None related to current problem ?  ?PATIENT SURVEYS:  ?FOTO 76%, projected 80% in 10 visits ?  ?COGNITION: ?          Overall cognitive status: Within functional limits for tasks assessed               ?           ?SENSATION: ?Light touch: L5-S2 diminished on Lt vs Rt ?  ?MUSCLE LENGTH: ?Hamstrings: Moderate tightness BIL ?Gastrocnemius: Moderate tightness BIL ?Soleus: Moderate tightness BIL ?  ?POSTURE:  ?BIL pes cavus ?  ?PALPATION: ?TTP to Lt piriformis with increased Lt sided N/T, TTP to BIL lumbar paraspinals ?  ?PASSIVE ACCESSORIES: ?Lumbar CPA's painful throughout with induced muscle spasms ?  ?ROM: ?  ? A/PROM Right ?11/01/2021 Left ?11/01/2021  ?Lumbar extension WNL  ?Lumbar flexion WNL  ?Lumbar side flexion 50% WNL  ?Lumbar rotation 50% WNL  ?Ankle dorsiflexion 10/11 5/10  ?Ankle plantarflexion 45/48 45/48  ?Ankle inversion 38/42 40/50  ?Ankle eversion 5/30 -5/30  ? (Blank rows = not tested) ?  ?LE MMT: ?  ?MMT Right ?11/01/2021 Left ?11/01/2021  ?Hip flexion 5/5 4+/5  ?Hip extension 3/5 3/5  ?Hip  abduction 3+/5 4/5  ?Knee flexion 5/5 4+/5  ?Knee extension 5/5 5/5  ?Ankle dorsiflexion 5/5 4/5  ?Ankle plantarflexion 5/5 5/5  ?Ankle inversion 5/5 5/5  ?Ankle eversion 3/5 2+/5  ? (Blank rows = not tested) ?  ?SPECIAL TESTS:  ?Ankle special tests: Tinel's test-Posterior tibialis: negative and Tinel's test-Deep peroneal: negative ?  ?           Slump: (+) on Lt ?           SLR: (+) on Lt ?  ?FUNCTIONAL TESTS:  ?DL heel raise W62: decreased heel height with Lt leg weakness and numbness ?  ?  ?  ?TODAY'S TREATMENT: ? ?[facility] Adult  PT Treatment:                                                DATE: 11/12/2021 ?Therapeutic Exercise: ?*** ?Manual Therapy: ?*** ?Neuromuscular re-ed: ?*** ?Therapeutic Activity: ?*** ?Modalities: ?*** ?Self Care: ?*** ? ? ?[facility] Adult PT Treatment:                                                DATE: 11/09/2021 ?Therapeutic Exercise: ?Nustep level 6 x 5 mins ?Slant board gastroc stretch 3x30" ?Heel raises on 4" step, focus on eccentric lowering 3x10 ?Double leg heel raise with single leg eccentric lowering x10 BIL ?Cybex hip abduction/ext 17.5# 2x10 BIL ?Long sitting ankle 4 way RTB x10 BIL ?Supine hamstring stretch with strap 2x30" BIL ?Supine figure 4 stretch 2x30" BIL ?Bridges 2x10 ? ? ?11/02/2021: Issued and demonstrated HEP ?  ?  ?PATIENT EDUCATION:  ?Education details: Pt educated on potential underlying pathophysiology contributing to her pain presentation, POC, prognosis, FOTO, and HEP ?Person educated: Patient ?Education method: Explanation, Demonstration, and Handouts ?Education comprehension: verbalized understanding and returned demonstration ?  ?  ?HOME EXERCISE PROGRAM: ?Access Code: [email or web address] ?URL: [email or web address] ?Date: 11/01/2021 ?Prepared by: Carmelina Dane ?  ?Exercises ?- Standing Heel Raise with Support  - 1 x daily - 7 x weekly - 3 sets - 10 reps - 3-sec hold ?- Gastroc Stretch on Wall  - 1 x daily - 7 x weekly - 2-min hold ?- Soleus Stretch on Wall  - 1 x daily - 7 x weekly - 2-min hold ?- Seated Toe Towel Scrunches  - 1 x daily - 7 x weekly - 3 sets - 10 reps ?- Seated Slump Nerve Glide  - 1 x daily - 7 x weekly - 3 sets - 20 reps ?  ?ASSESSMENT: ?  ?CLINICAL IMPRESSION: ?*** ? ?   ?OBJECTIVE IMPAIRMENTS Abnormal gait, decreased balance, decreased coordination, decreased endurance, decreased mobility, difficulty walking, decreased ROM, decreased strength, hypomobility, increased edema, impaired flexibility, impaired sensation, improper body mechanics, postural dysfunction,  and pain.  ?  ?ACTIVITY LIMITATIONS cleaning, community activity, driving, occupation, laundry, yard work, and shopping.  ?  ?PERSONAL FACTORS Time since onset of injury/illness/exacerbation and 1-2 comorbidities: Lumbar laminectomy 01/07/2017, Pre-diabetic  are also affecting patient's functional outcome.  ?  ? ?  ?  ?GOALS: ?Goals reviewed with patient? Yes ?  ?SHORT TERM GOALS: Target date: 11/30/2021 ?  ?Pt will report understanding and adherence to her HEP in order to promote independence in the management of her primary  impairments. ?Baseline: HEP provided at eval ?Goal status: INITIAL ?  ?  ?LONG TERM GOALS: Target date: 12/28/2021 ?  ?Pt will achieve a FOTO score of 80% in order to demonstrate improved functional ability as it relates to her feet/ ankle impairments. ?Baseline: 76% ?Goal status: INITIAL ?  ?2.  Pt will achieve BIL global ankle strength of 5/5 in order to progress to walking with less limitation. ?Baseline:  See MMT chart ?Goal status: INITIAL ?  ?3.  Pt will report ability to stand > 60 minutes with 0-2/10 pain in order to go grocery shopping with less limitation. ?Baseline: 45 minutes with >4/10 pain ?Goal status: INITIAL ?  ?4.  Pt will achieve a 25 DL heel raises with good form in order to progress to a running program with less limitation. ?Baseline: x10 with increased pain and diminished form ?Goal status: INITIAL ?  ?  ?  ?  ?PLAN: ?PT FREQUENCY: 1-2x/week ?  ?PT DURATION: 8 weeks ?  ?PLANNED INTERVENTIONS: Therapeutic exercises, Therapeutic activity, Neuromuscular re-education, Balance training, Gait training, Patient/Family education, Joint manipulation, Joint mobilization, Stair training, Orthotic/Fit training, Aquatic Therapy, Dry Needling, Electrical stimulation, Cryotherapy, Moist heat, Taping, Vasopneumatic device, Biofeedback, Ionotophoresis 4mg /ml Dexamethasone, and Manual therapy ?  ?PLAN FOR NEXT SESSION: Progress global LE strengthening/ stretching, along with desensitization/  manual techniques for symptom management ? ? ? ?Zadie Rhineucker Ryan Bahar Shelden, PT ?11/12/2021, 3:27 PM ? ?  ? ?

## 2021-11-15 NOTE — Therapy (Signed)
OUTPATIENT PHYSICAL THERAPY TREATMENT NOTE   Patient Name: Alexis Hartman Heidrick MRN: [id number] DOB:[birth year], 56 y.o., female Today's Date: 11/16/2021  PCP: Pincus SanesBurns, Stacy J, MD REFERRING PROVIDER: Louann SjogrenSikora, Rebecca, DPM  END OF SESSION:   PT End of Session - 11/16/21 1027     Visit Number 3    Number of Visits 17    Date for PT Re-Evaluation 01/04/22    Authorization Type [employer]    Authorization Time Period FOTO v6, v10    Progress Note Due on Visit 10    PT Start Time 1030    PT Stop Time 1112    PT Time Calculation (min) 42 min    Activity Tolerance Patient tolerated treatment well    Behavior During Therapy WFL for tasks assessed/performed              Past Medical History:  Diagnosis Date   Acute bronchitis    Allergic rhinitis, cause unspecified    Asthma    Atypical face pain    Cervicalgia    Diabetes mellitus without complication (HCC)    dx by MD, but no medications taken    Esophageal reflux    hx of   Headache(784.0)    Other convulsions    pseudo seizures  2002- none since then   Pure hypercholesterolemia    no prescription medications taken   Past Surgical History:  Procedure Laterality Date   ABDOMINAL HYSTERECTOMY     CYSTECTOMY  01/2004   Dr. Eda PaschalGottsegen   LAPAROSCOPIC CHOLECYSTECTOMY  05/2010   Dr. Abbey Chattersosenbower   LUMBAR LAMINECTOMY/DECOMPRESSION MICRODISCECTOMY Left 01/07/2017   Procedure: LEFT SIDED LUMBAR 5-SACRUM 1 MICRODISCECTOMY;  Surgeon: Estill Bambergumonski, Mark, MD;  Location: [facility] OR;  Service: Orthopedics;  Laterality: Left;  LEFT SIDED LUMBAR 5-SACRUM 1 MICRODISCECTOMY; REQUEST 2 HOURS AND FLIP ROOM   VESICOVAGINAL FISTULA CLOSURE W/ TAH  01/2004   Dr. 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   Family history of diabetes mellitus (DM) 11/28/2016   Helicobacter pylori antibody positive  02/10/2011   HYPERCHOLESTEROLEMIA 08/18/2010   Allergic rhinitis 06/30/2007    REFERRING DIAG: G63 (ICD-10-CM) - Polyneuropathy associated with underlying disease (HCC)  THERAPY DIAG:  Pain in left foot  Pain in right foot  Paresthesia of skin  Difficulty in walking, not elsewhere classified  Muscle weakness (generalized)  PERTINENT HISTORY: Lumbar laminectomy 01/07/2017, Pre-diabetic  PRECAUTIONS: None  ONSET DATE: 4 years ago    SUBJECTIVE: Pt reports increased Rt ankle and lumbar pain today after working out and doing a step class this morning before coming into PT. She reports doing her HEP regularly.   PAIN:  Are you having pain? Yes: NPRS scale: 5/10 Pain location: Rt foot, Lt global LE Pain description: dull, achy, numb, tingling Aggravating factors: working out (dead lifting, loading back), prolonged standing >45 minutes, sleeping at night (increases Lt thigh pain), laying on Lt side Relieving factors: inversion table, massage, heating pad, elevating legs   OBJECTIVE: (objective measures completed at initial evaluation unless otherwise dated)   DIAGNOSTIC FINDINGS: None related to current problem   PATIENT SURVEYS:  FOTO 76%, projected 80% in 10 visits   COGNITION:           Overall cognitive status: Within functional limits for tasks assessed  SENSATION: Light touch: L5-S2 diminished on Lt vs Rt   MUSCLE LENGTH: Hamstrings: Moderate tightness BIL Gastrocnemius: Moderate tightness BIL Soleus: Moderate tightness BIL   POSTURE:  BIL pes cavus   PALPATION: TTP to Lt piriformis with increased Lt sided N/T, TTP to BIL lumbar paraspinals   PASSIVE ACCESSORIES: Lumbar CPA's painful throughout with induced muscle spasms   ROM:    A/PROM Right 11/01/2021 Left 11/01/2021  Lumbar extension WNL  Lumbar flexion WNL  Lumbar side flexion 50% WNL  Lumbar rotation 50% WNL  Ankle dorsiflexion 10/11 5/10  Ankle plantarflexion 45/48  45/48  Ankle inversion 38/42 40/50  Ankle eversion 5/30 -5/30   (Blank rows = not tested)   LE MMT:   MMT Right 11/01/2021 Left 11/01/2021  Hip flexion 5/5 4+/5  Hip extension 3/5 3/5  Hip abduction 3+/5 4/5  Knee flexion 5/5 4+/5  Knee extension 5/5 5/5  Ankle dorsiflexion 5/5 4/5  Ankle plantarflexion 5/5 5/5  Ankle inversion 5/5 5/5  Ankle eversion 3/5 2+/5   (Blank rows = not tested)   SPECIAL TESTS:  Ankle special tests: Tinel's test-Posterior tibialis: negative and Tinel's test-Deep peroneal: negative              Slump: (+) on Lt            SLR: (+) on Lt   FUNCTIONAL TESTS:  DL heel raise X21: decreased heel height with Lt leg weakness and numbness       TODAY'S TREATMENT:  [facility] Adult PT Treatment:                                                DATE: 11/16/2021 Therapeutic Exercise: Long-sitting plantar fascia/ calf stretch with sheet x62min BIL DL bouncing heel raises on edge of Airex pad 3x30 Kickstand stance with lead foot on edge of Airex pad and with 5# kettlebell lateral hand-offs 3x20 BIL Standing IT band stretch x65min BIL Standing hip flexion with subsequent knee extension with 7# cable ankle attachment at Free Motion machine 2x10 BIL Standing hip abduction with 7# cable ankle attachment at Free Motion machine 2x10 BIL Standing hip extension with 7# cable ankle attachment at Free Motion machine 2x10 BIL Manual Therapy: N/A Neuromuscular re-ed: N/A Therapeutic Activity: N/A Modalities: N/A Self Care: N/A   [facility] Adult PT Treatment:                                                DATE: 11/09/2021 Therapeutic Exercise: Nustep level 6 x 5 mins Slant board gastroc stretch 3x30" Heel raises on 4" step, focus on eccentric lowering 3x10 Double leg heel raise with single leg eccentric lowering x10 BIL Cybex hip abduction/ext 17.5# 2x10 BIL Long sitting ankle 4 way RTB x10 BIL Supine hamstring stretch with strap 2x30" BIL Supine figure 4 stretch 2x30"  BIL Bridges 2x10   11/02/2021: Issued and demonstrated HEP     PATIENT EDUCATION:  Education details: Pt educated on potential underlying pathophysiology contributing to her pain presentation, POC, prognosis, FOTO, and HEP Person educated: Patient Education method: Explanation, Demonstration, and Handouts Education comprehension: verbalized understanding and returned demonstration     HOME EXERCISE PROGRAM: Access Code: [email or web address] URL: [email or web address] Date: 11/01/2021 Prepared by: Pricilla Holm  Kohan Azizi   Exercises - Standing Heel Raise with Support  - 1 x daily - 7 x weekly - 3 sets - 10 reps - 3-sec hold - Gastroc Stretch on Wall  - 1 x daily - 7 x weekly - 2-min hold - Soleus Stretch on Wall  - 1 x daily - 7 x weekly - 2-min hold - Seated Toe Towel Scrunches  - 1 x daily - 7 x weekly - 3 sets - 10 reps - Seated Slump Nerve Glide  - 1 x daily - 7 x weekly - 3 sets - 20 reps   ASSESSMENT:   CLINICAL IMPRESSION: Pt responded well to all interventions today, demonstrating improved form and no increase in pain with all performed exercises. She will continue to benefit from skilled PT to address her primary impairments and return to her prior level of function with less limitation.      OBJECTIVE IMPAIRMENTS Abnormal gait, decreased balance, decreased coordination, decreased endurance, decreased mobility, difficulty walking, decreased ROM, decreased strength, hypomobility, increased edema, impaired flexibility, impaired sensation, improper body mechanics, postural dysfunction, and pain.    ACTIVITY LIMITATIONS cleaning, community activity, driving, occupation, Pharmacologist, yard work, and shopping.    PERSONAL FACTORS Time since onset of injury/illness/exacerbation and 1-2 comorbidities: Lumbar laminectomy 01/07/2017, Pre-diabetic  are also affecting patient's functional outcome.         GOALS: Goals reviewed with patient? Yes   SHORT TERM GOALS: Target date: 11/30/2021    Pt will report understanding and adherence to her HEP in order to promote independence in the management of her primary impairments. Baseline: HEP provided at eval Goal status: INITIAL     LONG TERM GOALS: Target date: 12/28/2021   Pt will achieve a FOTO score of 80% in order to demonstrate improved functional ability as it relates to her feet/ ankle impairments. Baseline: 76% Goal status: INITIAL   2.  Pt will achieve BIL global ankle strength of 5/5 in order to progress to walking with less limitation. Baseline:  See MMT chart Goal status: INITIAL   3.  Pt will report ability to stand > 60 minutes with 0-2/10 pain in order to go grocery shopping with less limitation. Baseline: 45 minutes with >4/10 pain Goal status: INITIAL   4.  Pt will achieve a 25 DL heel raises with good form in order to progress to a running program with less limitation. Baseline: x10 with increased pain and diminished form Goal status: INITIAL         PLAN: PT FREQUENCY: 1-2x/week   PT DURATION: 8 weeks   PLANNED INTERVENTIONS: Therapeutic exercises, Therapeutic activity, Neuromuscular re-education, Balance training, Gait training, Patient/Family education, Joint manipulation, Joint mobilization, Stair training, Orthotic/Fit training, Aquatic Therapy, Dry Needling, Electrical stimulation, Cryotherapy, Moist heat, Taping, Vasopneumatic device, Biofeedback, Ionotophoresis 4mg /ml Dexamethasone, and Manual therapy   PLAN FOR NEXT SESSION: Progress global LE strengthening/ stretching, along with desensitization/ manual techniques for symptom management    , PT 11/16/2021, 11:14 AM

## 2021-11-16 ENCOUNTER — Ambulatory Visit: Payer: 59

## 2021-11-16 DIAGNOSIS — R262 Difficulty in walking, not elsewhere classified: Secondary | ICD-10-CM

## 2021-11-16 DIAGNOSIS — M6281 Muscle weakness (generalized): Secondary | ICD-10-CM

## 2021-11-16 DIAGNOSIS — R202 Paresthesia of skin: Secondary | ICD-10-CM

## 2021-11-16 DIAGNOSIS — M79672 Pain in left foot: Secondary | ICD-10-CM

## 2021-11-16 DIAGNOSIS — M79671 Pain in right foot: Secondary | ICD-10-CM

## 2021-11-19 ENCOUNTER — Ambulatory Visit: Payer: 59

## 2021-11-19 DIAGNOSIS — M79672 Pain in left foot: Secondary | ICD-10-CM | POA: Diagnosis not present

## 2021-11-19 DIAGNOSIS — R202 Paresthesia of skin: Secondary | ICD-10-CM

## 2021-11-19 DIAGNOSIS — M6281 Muscle weakness (generalized): Secondary | ICD-10-CM

## 2021-11-19 DIAGNOSIS — M79671 Pain in right foot: Secondary | ICD-10-CM

## 2021-11-19 DIAGNOSIS — R262 Difficulty in walking, not elsewhere classified: Secondary | ICD-10-CM

## 2021-11-19 NOTE — Therapy (Signed)
OUTPATIENT PHYSICAL THERAPY TREATMENT NOTE   Patient Name: Alexis Hartman MRN: [id number] DOB:[birth year], 56 y.o., female Today's Date: 11/19/2021  PCP: Pincus Sanes, MD REFERRING PROVIDER: Louann Sjogren, DPM  END OF SESSION:   PT End of Session - 11/19/21 1610     Visit Number 4    Number of Visits 17    Date for PT Re-Evaluation 01/04/22    Authorization Type UHC    Authorization Time Period FOTO v6, v10    Progress Note Due on Visit 10    PT Start Time 1612    PT Stop Time 1656    PT Time Calculation (min) 44 min    Activity Tolerance Patient tolerated treatment well    Behavior During Therapy WFL for tasks assessed/performed               Past Medical History:  Diagnosis Date   Acute bronchitis    Allergic rhinitis, cause unspecified    Asthma    Atypical face pain    Cervicalgia    Diabetes mellitus without complication (HCC)    dx by MD, but no medications taken    Esophageal reflux    hx of   Headache(784.0)    Other convulsions    pseudo seizures  2002- none since then   Pure hypercholesterolemia    no prescription medications taken   Past Surgical History:  Procedure Laterality Date   ABDOMINAL HYSTERECTOMY     CYSTECTOMY  01/2004   Dr. Eda Paschal   LAPAROSCOPIC CHOLECYSTECTOMY  05/2010   Dr. Abbey Chatters   LUMBAR LAMINECTOMY/DECOMPRESSION MICRODISCECTOMY Left 01/07/2017   Procedure: LEFT SIDED LUMBAR 5-SACRUM 1 MICRODISCECTOMY;  Surgeon: Estill Bamberg, MD;  Location: [facility] OR;  Service: Orthopedics;  Laterality: Left;  LEFT SIDED LUMBAR 5-SACRUM 1 MICRODISCECTOMY; REQUEST 2 HOURS AND FLIP ROOM   VESICOVAGINAL FISTULA CLOSURE W/ TAH  01/2004   Dr. 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   Family history of diabetes mellitus (DM) 11/28/2016   Helicobacter pylori antibody  positive 02/10/2011   HYPERCHOLESTEROLEMIA 08/18/2010   Allergic rhinitis 06/30/2007    REFERRING DIAG: G63 (ICD-10-CM) - Polyneuropathy associated with underlying disease (HCC)  THERAPY DIAG:  Pain in left foot  Pain in right foot  Paresthesia of skin  Difficulty in walking, not elsewhere classified  Muscle weakness (generalized)  PERTINENT HISTORY: Lumbar laminectomy 01/07/2017, Pre-diabetic  PRECAUTIONS: None  ONSET DATE: 4 years ago    SUBJECTIVE: Pt reports doing cleans and presses yesterday in her workout class and a 4-mile walk this morning. She reports back tightness and mild super Rt foot pain. She reports improved calf tightness.   PAIN:  Are you having pain? Yes: NPRS scale: 5/10 Pain location: Rt foot, Lt global LE Pain description: dull, achy, numb, tingling Aggravating factors: working out (dead lifting, loading back), prolonged standing >45 minutes, sleeping at night (increases Lt thigh pain), laying on Lt side Relieving factors: inversion table, massage, heating pad, elevating legs   OBJECTIVE: (objective measures completed at initial evaluation unless otherwise dated)   DIAGNOSTIC FINDINGS: None related to current problem   PATIENT SURVEYS:  FOTO 76%, projected 80% in 10 visits   COGNITION:           Overall cognitive status: Within functional limits for tasks assessed  SENSATION: Light touch: L5-S2 diminished on Lt vs Rt   MUSCLE LENGTH: Hamstrings: Moderate tightness BIL Gastrocnemius: Moderate tightness BIL Soleus: Moderate tightness BIL   POSTURE:  BIL pes cavus   PALPATION: TTP to Lt piriformis with increased Lt sided N/T, TTP to BIL lumbar paraspinals   PASSIVE ACCESSORIES: Lumbar CPA's painful throughout with induced muscle spasms   ROM:    A/PROM Right 11/01/2021 Left 11/01/2021  Lumbar extension WNL  Lumbar flexion WNL  Lumbar side flexion 50% WNL  Lumbar rotation 50% WNL  Ankle dorsiflexion 10/11  5/10  Ankle plantarflexion 45/48 45/48  Ankle inversion 38/42 40/50  Ankle eversion 5/30 -5/30   (Blank rows = not tested)   LE MMT:   MMT Right 11/01/2021 Left 11/01/2021  Hip flexion 5/5 4+/5  Hip extension 3/5 3/5  Hip abduction 3+/5 4/5  Knee flexion 5/5 4+/5  Knee extension 5/5 5/5  Ankle dorsiflexion 5/5 4/5  Ankle plantarflexion 5/5 5/5  Ankle inversion 5/5 5/5  Ankle eversion 3/5 2+/5   (Blank rows = not tested)   SPECIAL TESTS:  Ankle special tests: Tinel's test-Posterior tibialis: negative and Tinel's test-Deep peroneal: negative              Slump: (+) on Lt            SLR: (+) on Lt   FUNCTIONAL TESTS:  DL heel raise E45x10: decreased heel height with Lt leg weakness and numbness       TODAY'S TREATMENT:  [facility] Adult PT Treatment:                                                DATE: 11/19/2021 Therapeutic Exercise: Seated Lt piriformis stretch x442min Standing Lt IT band stretch x302min Squat into heel raise with overhead reach with waist attachment and two 10# cables at Free Motion machine 3x10 SL heel raise with slow lowering with waist attachment and one 3# cable from behind 2x10 BIL Kickstand stance with lead foot on edge of Airex pad, Pallof press with 7# cable 2x8 BIL on each side Forward lunge to each side of BOSU ball with lateral 5# kettlebell hand-offs 2x10 BIL on each side of ball Standing gastroc slant board stretch x502min Standing soleus slant board stretch x762min Manual Therapy: N/A Neuromuscular re-ed: N/A Therapeutic Activity: N/A Modalities: N/A Self Care: N/A   [facility] Adult PT Treatment:                                                DATE: 11/16/2021 Therapeutic Exercise: Long-sitting plantar fascia/ calf stretch with sheet x502min BIL DL bouncing heel raises on edge of Airex pad 3x30 Kickstand stance with lead foot on edge of Airex pad and with 5# kettlebell lateral hand-offs 3x20 BIL Standing IT band stretch x461min BIL Standing hip flexion  with subsequent knee extension with 7# cable ankle attachment at Free Motion machine 2x10 BIL Standing hip abduction with 7# cable ankle attachment at Free Motion machine 2x10 BIL Standing hip extension with 7# cable ankle attachment at Free Motion machine 2x10 BIL Manual Therapy: N/A Neuromuscular re-ed: N/A Therapeutic Activity: N/A Modalities: N/A Self Care: N/A   [facility]  DATE: 11/09/2021 Therapeutic Exercise: Nustep level 6 x 5 mins Slant board gastroc stretch 3x30" Heel raises on 4" step, focus on eccentric lowering 3x10 Double leg heel raise with single leg eccentric lowering x10 BIL Cybex hip abduction/ext 17.5# 2x10 BIL Long sitting ankle 4 way RTB x10 BIL Supine hamstring stretch with strap 2x30" BIL Supine figure 4 stretch 2x30" BIL Bridges 2x10      PATIENT EDUCATION:  Education details: Pt educated on potential underlying pathophysiology contributing to her pain presentation, POC, prognosis, FOTO, and HEP Person educated: Patient Education method: Programmer, multimedia, Demonstration, and Handouts Education comprehension: verbalized understanding and returned demonstration     HOME EXERCISE PROGRAM: Access Code: [email or web address] URL: [email or web address] Date: 11/01/2021 Prepared by: Carmelina Dane   Exercises - Standing Heel Raise with Support  - 1 x daily - 7 x weekly - 3 sets - 10 reps - 3-sec hold - Gastroc Stretch on Wall  - 1 x daily - 7 x weekly - 2-min hold - Soleus Stretch on Wall  - 1 x daily - 7 x weekly - 2-min hold - Seated Toe Towel Scrunches  - 1 x daily - 7 x weekly - 3 sets - 10 reps - Seated Slump Nerve Glide  - 1 x daily - 7 x weekly - 3 sets - 20 reps   ASSESSMENT:   CLINICAL IMPRESSION: Pt responded well to all interventions today, demonstrating improved functional ability and no increase in pain with therapeutic exercises. She will continue to benefit from skilled PT to  address her primary impairments and return to her prior level of function with less limitation.     OBJECTIVE IMPAIRMENTS Abnormal gait, decreased balance, decreased coordination, decreased endurance, decreased mobility, difficulty walking, decreased ROM, decreased strength, hypomobility, increased edema, impaired flexibility, impaired sensation, improper body mechanics, postural dysfunction, and pain.    ACTIVITY LIMITATIONS cleaning, community activity, driving, occupation, Pharmacologist, yard work, and shopping.    PERSONAL FACTORS Time since onset of injury/illness/exacerbation and 1-2 comorbidities: Lumbar laminectomy 01/07/2017, Pre-diabetic  are also affecting patient's functional outcome.         GOALS: Goals reviewed with patient? Yes   SHORT TERM GOALS: Target date: 11/30/2021   Pt will report understanding and adherence to her HEP in order to promote independence in the management of her primary impairments. Baseline: HEP provided at eval 11/19/2021: Pt reports regular adherence to her HEP Goal status: ACHIEVED     LONG TERM GOALS: Target date: 12/28/2021   Pt will achieve a FOTO score of 80% in order to demonstrate improved functional ability as it relates to her feet/ ankle impairments. Baseline: 76% Goal status: INITIAL   2.  Pt will achieve BIL global ankle strength of 5/5 in order to progress to walking with less limitation. Baseline:  See MMT chart Goal status: INITIAL   3.  Pt will report ability to stand > 60 minutes with 0-2/10 pain in order to go grocery shopping with less limitation. Baseline: 45 minutes with >4/10 pain Goal status: INITIAL   4.  Pt will achieve a 25 DL heel raises with good form in order to progress to a running program with less limitation. Baseline: x10 with increased pain and diminished form Goal status: INITIAL         PLAN: PT FREQUENCY: 1-2x/week   PT DURATION: 8 weeks   PLANNED INTERVENTIONS: Therapeutic exercises, Therapeutic  activity, Neuromuscular re-education, Balance training, Gait training, Patient/Family education, Joint manipulation, Joint mobilization, Stair training, Orthotic/Fit training, Aquatic Therapy, Dry  Needling, Electrical stimulation, Cryotherapy, Moist heat, Taping, Vasopneumatic device, Biofeedback, Ionotophoresis 4mg /ml Dexamethasone, and Manual therapy   PLAN FOR NEXT SESSION: Progress global LE strengthening/ stretching, along with desensitization/ manual techniques for symptom management    , PT 11/19/2021, 4:56 PM

## 2021-11-21 ENCOUNTER — Ambulatory Visit: Payer: 59

## 2021-11-21 DIAGNOSIS — M79671 Pain in right foot: Secondary | ICD-10-CM

## 2021-11-21 DIAGNOSIS — R202 Paresthesia of skin: Secondary | ICD-10-CM

## 2021-11-21 DIAGNOSIS — R262 Difficulty in walking, not elsewhere classified: Secondary | ICD-10-CM

## 2021-11-21 DIAGNOSIS — M79672 Pain in left foot: Secondary | ICD-10-CM

## 2021-11-21 DIAGNOSIS — M6281 Muscle weakness (generalized): Secondary | ICD-10-CM

## 2021-11-21 NOTE — Therapy (Signed)
OUTPATIENT PHYSICAL THERAPY TREATMENT NOTE   Patient Name: Alexis Hartman MRN: [id number] DOB:[birth year], 56 y.o., female Today's Date: 11/21/2021  PCP: Binnie Rail, MD REFERRING PROVIDER: Lorenda Peck, DPM  END OF SESSION:   PT End of Session - 11/21/21 1736     Visit Number 5    Number of Visits 17    Date for PT Re-Evaluation 01/04/22    Authorization Type UHC    Authorization Time Period FOTO v6, v10    Progress Note Due on Visit 10    PT Start Time 1738    PT Stop Time 1823    PT Time Calculation (min) 45 min    Activity Tolerance Patient tolerated treatment well    Behavior During Therapy WFL for tasks assessed/performed                Past Medical History:  Diagnosis Date   Acute bronchitis    Allergic rhinitis, cause unspecified    Asthma    Atypical face pain    Cervicalgia    Diabetes mellitus without complication ([place])    dx by MD, but no medications taken    Esophageal reflux    hx of   Headache(784.0)    Other convulsions    pseudo seizures  2002- none since then   Pure hypercholesterolemia    no prescription medications taken   Past Surgical History:  Procedure Laterality Date   ABDOMINAL HYSTERECTOMY     CYSTECTOMY  01/2004   Dr. Cherylann Banas   LAPAROSCOPIC CHOLECYSTECTOMY  05/2010   Dr. Zella Richer   LUMBAR LAMINECTOMY/DECOMPRESSION MICRODISCECTOMY Left 01/07/2017   Procedure: LEFT SIDED LUMBAR 5-SACRUM 1 MICRODISCECTOMY;  Surgeon: Phylliss Bob, MD;  Location: [facility];  Service: Orthopedics;  Laterality: Left;  LEFT SIDED LUMBAR 5-SACRUM 1 MICRODISCECTOMY; REQUEST 2 HOURS AND FLIP ROOM   VESICOVAGINAL FISTULA CLOSURE W/ TAH  01/2004   Dr. 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   Family history of diabetes mellitus (DM) 123XX123   Helicobacter pylori antibody  positive 02/10/2011   HYPERCHOLESTEROLEMIA 08/18/2010   Allergic rhinitis 06/30/2007    REFERRING DIAG: G63 (ICD-10-CM) - Polyneuropathy associated with underlying disease ([place])  THERAPY DIAG:  Pain in left foot  Pain in right foot  Paresthesia of skin  Difficulty in walking, not elsewhere classified  Muscle weakness (generalized)  PERTINENT HISTORY: Lumbar laminectomy 01/07/2017, Pre-diabetic  PRECAUTIONS: None  ONSET DATE: 4 years ago    SUBJECTIVE: Pt reports mild Lt thigh pain today with no Rt foot pain. She also reports walking 5 miles earlier today for cardio.   PAIN:  Are you having pain? Yes: NPRS scale: 4/10 Pain location: Lt posterior thigh Pain description: dull, achy, numb, tingling Aggravating factors: working out (dead lifting, loading back), prolonged standing >45 minutes, sleeping at night (increases Lt thigh pain), laying on Lt side Relieving factors: inversion table, massage, heating pad, elevating legs   OBJECTIVE: (objective measures completed at initial evaluation unless otherwise dated)   DIAGNOSTIC FINDINGS: None related to current problem   PATIENT SURVEYS:  FOTO 76%, projected 80% in 10 visits   COGNITION:           Overall cognitive status: Within functional limits for tasks assessed  SENSATION: Light touch: L5-S2 diminished on Lt vs Rt   MUSCLE LENGTH: Hamstrings: Moderate tightness BIL Gastrocnemius: Moderate tightness BIL Soleus: Moderate tightness BIL   POSTURE:  BIL pes cavus   PALPATION: TTP to Lt piriformis with increased Lt sided N/T, TTP to BIL lumbar paraspinals   PASSIVE ACCESSORIES: Lumbar CPA's painful throughout with induced muscle spasms   ROM:    A/PROM Right 11/01/2021 Left 11/01/2021  Lumbar extension WNL  Lumbar flexion WNL  Lumbar side flexion 50% WNL  Lumbar rotation 50% WNL  Ankle dorsiflexion 10/11 5/10  Ankle plantarflexion 45/48 45/48  Ankle inversion 38/42 40/50  Ankle  eversion 5/30 -5/30   (Blank rows = not tested)   LE MMT:   MMT Right 11/01/2021 Left 11/01/2021  Hip flexion 5/5 4+/5  Hip extension 3/5 3/5  Hip abduction 3+/5 4/5  Knee flexion 5/5 4+/5  Knee extension 5/5 5/5  Ankle dorsiflexion 5/5 4/5  Ankle plantarflexion 5/5 5/5  Ankle inversion 5/5 5/5  Ankle eversion 3/5 2+/5   (Blank rows = not tested)   SPECIAL TESTS:  Ankle special tests: Tinel's test-Posterior tibialis: negative and Tinel's test-Deep peroneal: negative              Slump: (+) on Lt            SLR: (+) on Lt   FUNCTIONAL TESTS:  DL heel raise x10: decreased heel height with Lt leg weakness and numbness       TODAY'S TREATMENT:  [facility] Adult PT Treatment:                                                DATE: 11/21/2021 Therapeutic Exercise: Supine straight leg hamstring stretch with strap x2 min on Lt Supine straight leg IT band stretch with strap x2 min on Lt Mini squat side step walk-outs with 13# cable to waist attachment 2x6 BIL Mini squat forward step-outs with 13# cable to waist attachment 2x6 Mini squat backward step-outs with 13# cable to waist attachment 2x6 Heel raises on edge of Airex pad 3x15 Tall-kneeling hip thrust with 27# Free Motion cable to waist attachment and knees on Airex pad 3x12 Bulgarian split squats with 15# kettlebell in one hand and UE support to chair with other hand 2x10 BIL Bouncing straight leg heel raises on Cybex leg press 3x30 Standing slant board gastroc strech x2 min Manual Therapy: N/A Neuromuscular re-ed: N/A Therapeutic Activity: N/A Modalities: N/A Self Care: N/A   [facility] Adult PT Treatment:                                                DATE: 11/19/2021 Therapeutic Exercise: Seated Lt piriformis stretch x32min Standing Lt IT band stretch x31min Squat into heel raise with overhead reach with waist attachment and two 10# cables at Free Motion machine 3x10 SL heel raise with slow lowering with waist attachment and one  3# cable from behind 2x10 BIL Kickstand stance with lead foot on edge of Airex pad, Pallof press with 7# cable 2x8 BIL on each side Forward lunge to each side of BOSU ball with lateral 5# kettlebell hand-offs 2x10 BIL on each side of ball Standing gastroc slant board stretch x81min Standing soleus slant  board stretch x40min Manual Therapy: N/A Neuromuscular re-ed: N/A Therapeutic Activity: N/A Modalities: N/A Self Care: N/A   [facility] Adult PT Treatment:                                                DATE: 11/16/2021 Therapeutic Exercise: Long-sitting plantar fascia/ calf stretch with sheet x109min BIL DL bouncing heel raises on edge of Airex pad 3x30 Kickstand stance with lead foot on edge of Airex pad and with 5# kettlebell lateral hand-offs 3x20 BIL Standing IT band stretch x51min BIL Standing hip flexion with subsequent knee extension with 7# cable ankle attachment at Free Motion machine 2x10 BIL Standing hip abduction with 7# cable ankle attachment at Free Motion machine 2x10 BIL Standing hip extension with 7# cable ankle attachment at Free Motion machine 2x10 BIL Manual Therapy: N/A Neuromuscular re-ed: N/A Therapeutic Activity: N/A Modalities: N/A Self Care: N/A        PATIENT EDUCATION:  Education details: Pt educated on potential underlying pathophysiology contributing to her pain presentation, POC, prognosis, FOTO, and HEP Person educated: Patient Education method: Explanation, Demonstration, and Handouts Education comprehension: verbalized understanding and returned demonstration     HOME EXERCISE PROGRAM: Access Code: [email or web address] URL: [email or web address] Date: 11/01/2021 Prepared by: Vanessa McArthur   Exercises - Standing Heel Raise with Support  - 1 x daily - 7 x weekly - 3 sets - 10 reps - 3-sec hold - Gastroc Stretch on Wall  - 1 x daily - 7 x weekly - 2-min hold - Soleus Stretch on Wall  - 1 x daily - 7 x weekly - 2-min hold - Seated Toe  Towel Scrunches  - 1 x daily - 7 x weekly - 3 sets - 10 reps - Seated Slump Nerve Glide  - 1 x daily - 7 x weekly - 3 sets - 20 reps   ASSESSMENT:   CLINICAL IMPRESSION: Pt responded excellently to all interventions today, demonstrating improved form and no increase in pain with progressed exercises today. She reports a therapeutic response to early hip stretching. Pt will benefit from continued skilled PT to address her primary impairments and return to her prior level of function with less limitation.      OBJECTIVE IMPAIRMENTS Abnormal gait, decreased balance, decreased coordination, decreased endurance, decreased mobility, difficulty walking, decreased ROM, decreased strength, hypomobility, increased edema, impaired flexibility, impaired sensation, improper body mechanics, postural dysfunction, and pain.    ACTIVITY LIMITATIONS cleaning, community activity, driving, occupation, Medical sales representative, yard work, and shopping.    PERSONAL FACTORS Time since onset of injury/illness/exacerbation and 1-2 comorbidities: Lumbar laminectomy 01/07/2017, Pre-diabetic  are also affecting patient's functional outcome.         GOALS: Goals reviewed with patient? Yes   SHORT TERM GOALS: Target date: 11/30/2021   Pt will report understanding and adherence to her HEP in order to promote independence in the management of her primary impairments. Baseline: HEP provided at eval 11/19/2021: Pt reports regular adherence to her HEP Goal status: ACHIEVED     LONG TERM GOALS: Target date: 12/28/2021   Pt will achieve a FOTO score of 80% in order to demonstrate improved functional ability as it relates to her feet/ ankle impairments. Baseline: 76% Goal status: INITIAL   2.  Pt will achieve BIL global ankle strength of 5/5 in order to progress to walking with less limitation. Baseline:  See MMT chart Goal status: INITIAL   3.  Pt will report ability to stand > 60 minutes with 0-2/10 pain in order to go grocery shopping  with less limitation. Baseline: 45 minutes with >4/10 pain Goal status: INITIAL   4.  Pt will achieve a 25 DL heel raises with good form in order to progress to a running program with less limitation. Baseline: x10 with increased pain and diminished form Goal status: INITIAL         PLAN: PT FREQUENCY: 1-2x/week   PT DURATION: 8 weeks   PLANNED INTERVENTIONS: Therapeutic exercises, Therapeutic activity, Neuromuscular re-education, Balance training, Gait training, Patient/Family education, Joint manipulation, Joint mobilization, Stair training, Orthotic/Fit training, Aquatic Therapy, Dry Needling, Electrical stimulation, Cryotherapy, Moist heat, Taping, Vasopneumatic device, Biofeedback, Ionotophoresis 4mg /ml Dexamethasone, and Manual therapy   PLAN FOR NEXT SESSION: Progress global LE strengthening/ stretching, along with desensitization/ manual techniques for symptom management    Vanessa Throckmorton, PT, DPT 11/21/21 6:25 PM

## 2021-11-26 ENCOUNTER — Ambulatory Visit: Payer: 59

## 2021-11-26 DIAGNOSIS — M6281 Muscle weakness (generalized): Secondary | ICD-10-CM

## 2021-11-26 DIAGNOSIS — R262 Difficulty in walking, not elsewhere classified: Secondary | ICD-10-CM

## 2021-11-26 DIAGNOSIS — M79671 Pain in right foot: Secondary | ICD-10-CM

## 2021-11-26 DIAGNOSIS — M79672 Pain in left foot: Secondary | ICD-10-CM | POA: Diagnosis not present

## 2021-11-26 DIAGNOSIS — R202 Paresthesia of skin: Secondary | ICD-10-CM

## 2021-11-26 NOTE — Therapy (Signed)
OUTPATIENT PHYSICAL THERAPY TREATMENT NOTE   Patient Name: Alexis Hartman MRN: [id number] DOB:[birth year], 56 y.o., female Today's Date: 11/26/2021  PCP: Pincus SanesBurns, Stacy J, MD REFERRING PROVIDER: Louann SjogrenSikora, Rebecca, DPM  END OF SESSION:   PT End of Session - 11/26/21 1618     Visit Number 6    Number of Visits 17    Date for PT Re-Evaluation 01/04/22    Authorization Type U[employer]    Authorization Time Period FOTO v6, v10    Progress Note Due on Visit 10    PT Start Time 1618    PT Stop Time 1700    PT Time Calculation (min) 42 min    Activity Tolerance Patient tolerated treatment well    Behavior During Therapy WFL for tasks assessed/performed                 Past Medical History:  Diagnosis Date   Acute bronchitis    Allergic rhinitis, cause unspecified    Asthma    Atypical face pain    Cervicalgia    Diabetes mellitus without complication (HCC)    dx by MD, but no medications taken    Esophageal reflux    hx of   Headache(784.0)    Other convulsions    pseudo seizures  2002- none since then   Pure hypercholesterolemia    no prescription medications taken   Past Surgical History:  Procedure Laterality Date   ABDOMINAL HYSTERECTOMY     CYSTECTOMY  01/2004   Dr. Eda PaschalGottsegen   LAPAROSCOPIC CHOLECYSTECTOMY  05/2010   Dr. Abbey Chattersosenbower   LUMBAR LAMINECTOMY/DECOMPRESSION MICRODISCECTOMY Left 01/07/2017   Procedure: LEFT SIDED LUMBAR 5-SACRUM 1 MICRODISCECTOMY;  Surgeon: Estill Bambergumonski, Mark, MD;  Location: [facility] OR;  Service: Orthopedics;  Laterality: Left;  LEFT SIDED LUMBAR 5-SACRUM 1 MICRODISCECTOMY; REQUEST 2 HOURS AND FLIP ROOM   VESICOVAGINAL FISTULA CLOSURE W/ TAH  01/2004   Dr. 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   Family history of diabetes mellitus (DM) 11/28/2016   Helicobacter pylori antibody  positive 02/10/2011   HYPERCHOLESTEROLEMIA 08/18/2010   Allergic rhinitis 06/30/2007    REFERRING DIAG: G63 (ICD-10-CM) - Polyneuropathy associated with underlying disease (HCC)  THERAPY DIAG:  Pain in left foot  Pain in right foot  Paresthesia of skin  Difficulty in walking, not elsewhere classified  Muscle weakness (generalized)  PERTINENT HISTORY: Lumbar laminectomy 01/07/2017, Pre-diabetic  PRECAUTIONS: None  ONSET DATE: 4 years ago    SUBJECTIVE: Pt reports 3/10 Lt posterior thigh pain, adding that she has no foot pain today. She reports HEP adherence.  PAIN:  Are you having pain? Yes: NPRS scale: 3/10 Pain location: Lt posterior thigh Pain description: dull, achy, numb, tingling Aggravating factors: working out (dead lifting, loading back), prolonged standing >45 minutes, sleeping at night (increases Lt thigh pain), laying on Lt side Relieving factors: inversion table, massage, heating pad, elevating legs   OBJECTIVE: (objective measures completed at initial evaluation unless otherwise dated)   DIAGNOSTIC FINDINGS: None related to current problem   PATIENT SURVEYS:  FOTO 76%, projected 80% in 10 visits 11/26/2021: 66%   COGNITION:           Overall cognitive status: Within functional limits for tasks assessed  SENSATION: Light touch: L5-S2 diminished on Lt vs Rt   MUSCLE LENGTH: Hamstrings: Moderate tightness BIL Gastrocnemius: Moderate tightness BIL Soleus: Moderate tightness BIL   POSTURE:  BIL pes cavus   PALPATION: TTP to Lt piriformis with increased Lt sided N/T, TTP to BIL lumbar paraspinals   PASSIVE ACCESSORIES: Lumbar CPA's painful throughout with induced muscle spasms   ROM:    A/PROM Right 11/01/2021 Left 11/01/2021 Right 11/26/2021 Left 11/26/2021  Lumbar extension WNL    Lumbar flexion WNL    Lumbar side flexion 50% WNL    Lumbar rotation 50% WNL    Ankle dorsiflexion 10/11 5/10 12/15 10/15  Ankle  plantarflexion 45/48 45/48 45/50  40/45  Ankle inversion 38/42 40/50 45/55  30/50  Ankle eversion 5/30 -5/30 20/40 20/45   (Blank rows = not tested)   LE MMT:   MMT Right 11/01/2021 Left 11/01/2021 Right 11/26/2021 Left 11/26/2021  Hip flexion 5/5 4+/5 5/5 5/5  Hip extension 3/5 3/5 5/5 5/5  Hip abduction 3+/5 4/5 5/5 5/5  Knee flexion 5/5 4+/5 5/5 5/5  Knee extension 5/5 5/5 5/5 5/5  Ankle dorsiflexion 5/5 4/5 5/5 5/5  Ankle plantarflexion 5/5 5/5 5/5 5/5  Ankle inversion 5/5 5/5 5/5 5/5  Ankle eversion 3/5 2+/5 5/5 5/5   (Blank rows = not tested)   SPECIAL TESTS:  Ankle special tests: Tinel's test-Posterior tibialis: negative and Tinel's test-Deep peroneal: negative              Slump: (+) on Lt            SLR: (+) on Lt   FUNCTIONAL TESTS:  DL heel raise 11/28/2021: decreased heel height with Lt leg weakness and numbness       TODAY'S TREATMENT:  [facility] Adult PT Treatment:                                                DATE: 11/26/2021 Therapeutic Exercise: Kickstand stance with lead foot on edge of Airex pad, lateral hand-offs with 10# kettlebell 2x20 BIL Seated ankle inversion/ eversion with 6# dumbbell on towel x4 each BIL Tall-kneeling hip thrust with 30# Free Motion cable to waist attachment and knees on Airex pad 3x12 Manual Therapy: N/A Neuromuscular re-ed: N/A Therapeutic Activity: Re-administration of FOTO with pt education Re-assessment of objective information with pt education Modalities: N/A Self Care: N/A   [facility] Adult PT Treatment:                                                DATE: 11/21/2021 Therapeutic Exercise: Supine straight leg hamstring stretch with strap x2 min on Lt Supine straight leg IT band stretch with strap x2 min on Lt Mini squat side step walk-outs with 13# cable to waist attachment 2x6 BIL Mini squat forward step-outs with 13# cable to waist attachment 2x6 Mini squat backward step-outs with 13# cable to waist attachment 2x6 Heel raises on  edge of Airex pad 3x15 Tall-kneeling hip thrust with 27# Free Motion cable to waist attachment and knees on Airex pad 3x12 Bulgarian split squats with 15# kettlebell in one hand and UE support to chair with other hand 2x10 BIL Bouncing straight leg heel raises on Cybex leg press 3x30 Standing slant board  gastroc strech x2 min Manual Therapy: N/A Neuromuscular re-ed: N/A Therapeutic Activity: N/A Modalities: N/A Self Care: N/A   [facility] Adult PT Treatment:                                                DATE: 11/19/2021 Therapeutic Exercise: Seated Lt piriformis stretch x42min Standing Lt IT band stretch x73min Squat into heel raise with overhead reach with waist attachment and two 10# cables at Free Motion machine 3x10 SL heel raise with slow lowering with waist attachment and one 3# cable from behind 2x10 BIL Kickstand stance with lead foot on edge of Airex pad, Pallof press with 7# cable 2x8 BIL on each side Forward lunge to each side of BOSU ball with lateral 5# kettlebell hand-offs 2x10 BIL on each side of ball Standing gastroc slant board stretch x106min Standing soleus slant board stretch x7min Manual Therapy: N/A Neuromuscular re-ed: N/A Therapeutic Activity: N/A Modalities: N/A Self Care: N/A        PATIENT EDUCATION:  Education details: Pt educated on potential underlying pathophysiology contributing to her pain presentation, POC, prognosis, FOTO, and HEP Person educated: Patient Education method: Explanation, Demonstration, and Handouts Education comprehension: verbalized understanding and returned demonstration     HOME EXERCISE PROGRAM: Access Code: [email or web address] URL: [email or web address] Date: 11/01/2021 Prepared by: Carmelina Dane   Exercises - Standing Heel Raise with Support  - 1 x daily - 7 x weekly - 3 sets - 10 reps - 3-sec hold - Gastroc Stretch on Wall  - 1 x daily - 7 x weekly - 2-min hold - Soleus Stretch on Wall  - 1 x daily - 7 x  weekly - 2-min hold - Seated Toe Towel Scrunches  - 1 x daily - 7 x weekly - 3 sets - 10 reps - Seated Slump Nerve Glide  - 1 x daily - 7 x weekly - 3 sets - 20 reps   ASSESSMENT:   CLINICAL IMPRESSION: Pt responded well to all interventions, demonstrating improved form and no increase in pain throughout the session. Upon re-assessment, the pt has made excellent progress in global BIL ankle strength and ROM. She will continue to benefit from skilled PT to address her primary impairments and return to her prior level of function with less limitation.     OBJECTIVE IMPAIRMENTS Abnormal gait, decreased balance, decreased coordination, decreased endurance, decreased mobility, difficulty walking, decreased ROM, decreased strength, hypomobility, increased edema, impaired flexibility, impaired sensation, improper body mechanics, postural dysfunction, and pain.    ACTIVITY LIMITATIONS cleaning, community activity, driving, occupation, Pharmacologist, yard work, and shopping.    PERSONAL FACTORS Time since onset of injury/illness/exacerbation and 1-2 comorbidities: Lumbar laminectomy 01/07/2017, Pre-diabetic  are also affecting patient's functional outcome.         GOALS: Goals reviewed with patient? Yes   SHORT TERM GOALS: Target date: 11/30/2021   Pt will report understanding and adherence to her HEP in order to promote independence in the management of her primary impairments. Baseline: HEP provided at eval 11/19/2021: Pt reports regular adherence to her HEP Goal status: ACHIEVED     LONG TERM GOALS: Target date: 12/28/2021   Pt will achieve a FOTO score of 80% in order to demonstrate improved functional ability as it relates to her feet/ ankle impairments. Baseline: 76% 11/26/2021: 66% Goal status: IN PROGRESS   2.  Pt will  achieve BIL global ankle strength of 5/5 in order to progress to walking with less limitation. Baseline:  See MMT chart 11/26/2021: 5/5 global ankle strength Goal status:  ACHIEVED   3.  Pt will report ability to stand > 60 minutes with 0-2/10 pain in order to go grocery shopping with less limitation. Baseline: 45 minutes with >4/10 pain Goal status: INITIAL   4.  Pt will achieve a 25 DL heel raises with good form in order to progress to a running program with less limitation. Baseline: x10 with increased pain and diminished form Goal status: INITIAL         PLAN: PT FREQUENCY: 1-2x/week   PT DURATION: 8 weeks   PLANNED INTERVENTIONS: Therapeutic exercises, Therapeutic activity, Neuromuscular re-education, Balance training, Gait training, Patient/Family education, Joint manipulation, Joint mobilization, Stair training, Orthotic/Fit training, Aquatic Therapy, Dry Needling, Electrical stimulation, Cryotherapy, Moist heat, Taping, Vasopneumatic device, Biofeedback, Ionotophoresis 4mg /ml Dexamethasone, and Manual therapy   PLAN FOR NEXT SESSION: Progress global LE strengthening/ stretching, along with desensitization/ manual techniques for symptom management    , PT, DPT 11/26/21 5:00 PM

## 2021-11-27 ENCOUNTER — Ambulatory Visit: Payer: 59

## 2021-11-27 DIAGNOSIS — R202 Paresthesia of skin: Secondary | ICD-10-CM

## 2021-11-27 DIAGNOSIS — M79672 Pain in left foot: Secondary | ICD-10-CM | POA: Diagnosis not present

## 2021-11-27 DIAGNOSIS — R262 Difficulty in walking, not elsewhere classified: Secondary | ICD-10-CM

## 2021-11-27 DIAGNOSIS — M6281 Muscle weakness (generalized): Secondary | ICD-10-CM

## 2021-11-27 DIAGNOSIS — M79671 Pain in right foot: Secondary | ICD-10-CM

## 2021-11-27 NOTE — Therapy (Signed)
OUTPATIENT PHYSICAL THERAPY TREATMENT NOTE   Patient Name: Alexis Hartman MRN: [id number] DOB:[birth year], 56 y.o., female Today's Date: 11/27/2021  PCP: Pincus Sanes, MD REFERRING PROVIDER: Louann Sjogren, DPM  END OF SESSION:   PT End of Session - 11/27/21 1613     Visit Number 7    Number of Visits 17    Date for PT Re-Evaluation 01/04/22    Authorization Type UHC    Authorization Time Period FOTO v6, v10    Progress Note Due on Visit 10    PT Start Time 1615    PT Stop Time 1656    PT Time Calculation (min) 41 min    Activity Tolerance Patient tolerated treatment well    Behavior During Therapy WFL for tasks assessed/performed                  Past Medical History:  Diagnosis Date   Acute bronchitis    Allergic rhinitis, cause unspecified    Asthma    Atypical face pain    Cervicalgia    Diabetes mellitus without complication (HCC)    dx by MD, but no medications taken    Esophageal reflux    hx of   Headache(784.0)    Other convulsions    pseudo seizures  2002- none since then   Pure hypercholesterolemia    no prescription medications taken   Past Surgical History:  Procedure Laterality Date   ABDOMINAL HYSTERECTOMY     CYSTECTOMY  01/2004   Dr. Eda Paschal   LAPAROSCOPIC CHOLECYSTECTOMY  05/2010   Dr. Abbey Chatters   LUMBAR LAMINECTOMY/DECOMPRESSION MICRODISCECTOMY Left 01/07/2017   Procedure: LEFT SIDED LUMBAR 5-SACRUM 1 MICRODISCECTOMY;  Surgeon: Estill Bamberg, MD;  Location: [facility] OR;  Service: Orthopedics;  Laterality: Left;  LEFT SIDED LUMBAR 5-SACRUM 1 MICRODISCECTOMY; REQUEST 2 HOURS AND FLIP ROOM   VESICOVAGINAL FISTULA CLOSURE W/ TAH  01/2004   Dr. 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   Family history of diabetes mellitus (DM) 11/28/2016   Helicobacter pylori antibody  positive 02/10/2011   HYPERCHOLESTEROLEMIA 08/18/2010   Allergic rhinitis 06/30/2007    REFERRING DIAG: G63 (ICD-10-CM) - Polyneuropathy associated with underlying disease (HCC)  THERAPY DIAG:  Pain in left foot  Pain in right foot  Paresthesia of skin  Difficulty in walking, not elsewhere classified  Muscle weakness (generalized)  PERTINENT HISTORY: Lumbar laminectomy 01/07/2017, Pre-diabetic  PRECAUTIONS: None  ONSET DATE: 4 years ago    SUBJECTIVE: Pt reports no foot pain today, but adds she still has 3/10 posterior Lt thigh pain. She reports continued adherence to her HEP.  PAIN:  Are you having pain? Yes: NPRS scale: 3/10 Pain location: Lt posterior thigh Pain description: dull, achy, numb, tingling Aggravating factors: working out (dead lifting, loading back), prolonged standing >45 minutes, sleeping at night (increases Lt thigh pain), laying on Lt side Relieving factors: inversion table, massage, heating pad, elevating legs   OBJECTIVE: (objective measures completed at initial evaluation unless otherwise dated)   DIAGNOSTIC FINDINGS: None related to current problem   PATIENT SURVEYS:  FOTO 76%, projected 80% in 10 visits 11/26/2021: 66%   COGNITION:           Overall cognitive status: Within functional limits for tasks assessed  SENSATION: Light touch: L5-S2 diminished on Lt vs Rt   MUSCLE LENGTH: Hamstrings: Moderate tightness BIL Gastrocnemius: Moderate tightness BIL Soleus: Moderate tightness BIL   POSTURE:  BIL pes cavus   PALPATION: TTP to Lt piriformis with increased Lt sided N/T, TTP to BIL lumbar paraspinals   PASSIVE ACCESSORIES: Lumbar CPA's painful throughout with induced muscle spasms   ROM:    A/PROM Right 11/01/2021 Left 11/01/2021 Right 11/26/2021 Left 11/26/2021  Lumbar extension WNL    Lumbar flexion WNL    Lumbar side flexion 50% WNL    Lumbar rotation 50% WNL    Ankle dorsiflexion 10/11 5/10 12/15  10/15  Ankle plantarflexion 45/48 45/48 45/50  40/45  Ankle inversion 38/42 40/50 45/55  30/50  Ankle eversion 5/30 -5/30 20/40 20/45   (Blank rows = not tested)   LE MMT:   MMT Right 11/01/2021 Left 11/01/2021 Right 11/26/2021 Left 11/26/2021  Hip flexion 5/5 4+/5 5/5 5/5  Hip extension 3/5 3/5 5/5 5/5  Hip abduction 3+/5 4/5 5/5 5/5  Knee flexion 5/5 4+/5 5/5 5/5  Knee extension 5/5 5/5 5/5 5/5  Ankle dorsiflexion 5/5 4/5 5/5 5/5  Ankle plantarflexion 5/5 5/5 5/5 5/5  Ankle inversion 5/5 5/5 5/5 5/5  Ankle eversion 3/5 2+/5 5/5 5/5   (Blank rows = not tested)   SPECIAL TESTS:  Ankle special tests: Tinel's test-Posterior tibialis: negative and Tinel's test-Deep peroneal: negative              Slump: (+) on Lt            SLR: (+) on Lt   FUNCTIONAL TESTS:  DL heel raise [id number]: decreased heel height with Lt leg weakness and numbness       TODAY'S TREATMENT:  [facility] Adult PT Treatment:                                                DATE: 11/27/2021 Therapeutic Exercise: Squat hops with two 10# cables to waist attachment at Free Motion machine 3x14 5 degree incline forward treadmill walking with GTB around thighs at 2.5mph x3 min 5 degree incline mini-squat side step treadmill walking with GTB around thighs at 0.338mph x3 min BIL Bulgarian split squat stance with single leg soleus heel raises with 10# kettlebell 2x10 BIL Kickstand stance on Airex pad with forward/backward and lateral hand-offs with 5# kettlebell 2x10 BIL Tall-kneeling hip thrust with 33# Free Motion cable to waist attachment and knees on Airex pad 3x12 Standing slant board soleus stretch x2 min Manual Therapy: N/A Neuromuscular re-ed: N/A Therapeutic Activity: N/A Modalities: N/A Self Care: N/A   [facility] Adult PT Treatment:                                                DATE: 11/26/2021 Therapeutic Exercise: Kickstand stance with lead foot on edge of Airex pad, lateral hand-offs with 10# kettlebell 2x20  BIL Seated ankle inversion/ eversion with 6# dumbbell on towel x4 each BIL Tall-kneeling hip thrust with 30# Free Motion cable to waist attachment and knees on Airex pad 3x12 Manual Therapy: N/A Neuromuscular re-ed: N/A Therapeutic Activity: Re-administration of FOTO with pt education Re-assessment of objective information with pt education Modalities: N/A Self Care: N/A  [facility] Adult PT Treatment:                                                DATE: 11/21/2021 Therapeutic Exercise: Supine straight leg hamstring stretch with strap x2 min on Lt Supine straight leg IT band stretch with strap x2 min on Lt Mini squat side step walk-outs with 13# cable to waist attachment 2x6 BIL Mini squat forward step-outs with 13# cable to waist attachment 2x6 Mini squat backward step-outs with 13# cable to waist attachment 2x6 Heel raises on edge of Airex pad 3x15 Tall-kneeling hip thrust with 27# Free Motion cable to waist attachment and knees on Airex pad 3x12 Bulgarian split squats with 15# kettlebell in one hand and UE support to chair with other hand 2x10 BIL Bouncing straight leg heel raises on Cybex leg press 3x30 Standing slant board gastroc strech x2 min Manual Therapy: N/A Neuromuscular re-ed: N/A Therapeutic Activity: N/A Modalities: N/A Self Care: N/A      PATIENT EDUCATION:  Education details: Pt educated on potential underlying pathophysiology contributing to her pain presentation, POC, prognosis, FOTO, and HEP Person educated: Patient Education method: Explanation, Demonstration, and Handouts Education comprehension: verbalized understanding and returned demonstration     HOME EXERCISE PROGRAM: Access Code: [email or web address] URL: [email or web address] Date: 11/01/2021 Prepared by: Carmelina Dane   Exercises - Standing Heel Raise with Support  - 1 x daily - 7 x weekly - 3 sets - 10 reps - 3-sec hold - Gastroc Stretch on Wall  - 1 x daily - 7 x weekly - 2-min  hold - Soleus Stretch on Wall  - 1 x daily - 7 x weekly - 2-min hold - Seated Toe Towel Scrunches  - 1 x daily - 7 x weekly - 3 sets - 10 reps - Seated Slump Nerve Glide  - 1 x daily - 7 x weekly - 3 sets - 20 reps   ASSESSMENT:   CLINICAL IMPRESSION: Pt continues to respond well to progressed plyometric and strengthening exercises without increase in pain and with good form. She will continue to benefit from skilled PT to address her primary impairments and return to her prior level of function with less limitation.     OBJECTIVE IMPAIRMENTS Abnormal gait, decreased balance, decreased coordination, decreased endurance, decreased mobility, difficulty walking, decreased ROM, decreased strength, hypomobility, increased edema, impaired flexibility, impaired sensation, improper body mechanics, postural dysfunction, and pain.    ACTIVITY LIMITATIONS cleaning, community activity, driving, occupation, Pharmacologist, yard work, and shopping.    PERSONAL FACTORS Time since onset of injury/illness/exacerbation and 1-2 comorbidities: Lumbar laminectomy 01/07/2017, Pre-diabetic  are also affecting patient's functional outcome.         GOALS: Goals reviewed with patient? Yes   SHORT TERM GOALS: Target date: 11/30/2021   Pt will report understanding and adherence to her HEP in order to promote independence in the management of her primary impairments. Baseline: HEP provided at eval 11/19/2021: Pt reports regular adherence to her HEP Goal status: ACHIEVED     LONG TERM GOALS: Target date: 12/28/2021   Pt will achieve a FOTO score of 80% in order to demonstrate improved functional ability as it relates to her feet/ ankle impairments. Baseline: 76% 11/26/2021: 66% Goal status: IN PROGRESS   2.  Pt will achieve BIL global ankle strength of 5/5 in order to progress to walking with less  limitation. Baseline:  See MMT chart 11/26/2021: 5/5 global ankle strength Goal status: ACHIEVED   3.  Pt will report  ability to stand > 60 minutes with 0-2/10 pain in order to go grocery shopping with less limitation. Baseline: 45 minutes with >4/10 pain Goal status: INITIAL   4.  Pt will achieve a 25 DL heel raises with good form in order to progress to a running program with less limitation. Baseline: x10 with increased pain and diminished form Goal status: INITIAL         PLAN: PT FREQUENCY: 1-2x/week   PT DURATION: 8 weeks   PLANNED INTERVENTIONS: Therapeutic exercises, Therapeutic activity, Neuromuscular re-education, Balance training, Gait training, Patient/Family education, Joint manipulation, Joint mobilization, Stair training, Orthotic/Fit training, Aquatic Therapy, Dry Needling, Electrical stimulation, Cryotherapy, Moist heat, Taping, Vasopneumatic device, Biofeedback, Ionotophoresis 4mg /ml Dexamethasone, and Manual therapy   PLAN FOR NEXT SESSION: Progress global LE strengthening/ stretching, along with desensitization/ manual techniques for symptom management    , PT, DPT 11/27/21 4:56 PM

## 2021-11-28 ENCOUNTER — Ambulatory Visit: Payer: 59

## 2021-12-04 ENCOUNTER — Ambulatory Visit: Payer: 59

## 2021-12-04 NOTE — Therapy (Incomplete)
OUTPATIENT PHYSICAL THERAPY TREATMENT NOTE   Patient Name: Alexis Hartman MRN: [id number] DOB:1966/02/28, 56 y.o., female Today's Date: 12/04/2021  PCP: Pincus Sanes, MD REFERRING PROVIDER: Louann Sjogren, DPM  END OF SESSION:          Past Medical History:  Diagnosis Date   Acute bronchitis    Allergic rhinitis, cause unspecified    Asthma    Atypical face pain    Cervicalgia    Diabetes mellitus without complication (HCC)    dx by MD, but no medications taken    Esophageal reflux    hx of   Headache(784.0)    Other convulsions    pseudo seizures  2002- none since then   Pure hypercholesterolemia    no prescription medications taken   Past Surgical History:  Procedure Laterality Date   ABDOMINAL HYSTERECTOMY     CYSTECTOMY  01/2004   Dr. Eda Paschal   LAPAROSCOPIC CHOLECYSTECTOMY  05/2010   Dr. Abbey Chatters   LUMBAR LAMINECTOMY/DECOMPRESSION MICRODISCECTOMY Left 01/07/2017   Procedure: LEFT SIDED LUMBAR 5-SACRUM 1 MICRODISCECTOMY;  Surgeon: Estill Bamberg, MD;  Location: [facility] OR;  Service: Orthopedics;  Laterality: Left;  LEFT SIDED LUMBAR 5-SACRUM 1 MICRODISCECTOMY; REQUEST 2 HOURS AND FLIP ROOM   VESICOVAGINAL FISTULA CLOSURE W/ TAH  01/2004   Dr. 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  PERTINENT HISTORY: Lumbar laminectomy 01/07/2017, Pre-diabetic  PRECAUTIONS: None  ONSET DATE: 4 years ago    SUBJECTIVE: ***  PAIN:  Are you having pain? Yes: NPRS scale: 3/10 Pain location: Lt  posterior thigh Pain description: dull, achy, numb, tingling Aggravating factors: working out (dead lifting, loading back), prolonged standing >45 minutes, sleeping at night (increases Lt thigh pain), laying on Lt side Relieving factors: inversion table, massage, heating pad, elevating legs   OBJECTIVE: (objective measures completed at initial evaluation unless otherwise dated)   DIAGNOSTIC FINDINGS: None related to current problem   PATIENT SURVEYS:  FOTO 76%, projected 80% in 10 visits 11/26/2021: 66%   COGNITION:           Overall cognitive status: Within functional limits for tasks assessed                          SENSATION: Light touch: L5-S2 diminished on Lt vs Rt   MUSCLE LENGTH: Hamstrings: Moderate tightness BIL Gastrocnemius: Moderate tightness BIL Soleus: Moderate tightness BIL   POSTURE:  BIL pes cavus   PALPATION: TTP to Lt piriformis with increased Lt sided N/T, TTP to BIL lumbar paraspinals   PASSIVE ACCESSORIES: Lumbar CPA's painful throughout with induced muscle spasms   ROM:    A/PROM Right 11/01/2021 Left 11/01/2021 Right 11/26/2021 Left 11/26/2021  Lumbar extension WNL    Lumbar flexion WNL    Lumbar side flexion 50% WNL    Lumbar rotation 50% WNL    Ankle dorsiflexion 10/11 5/10 12/15 10/15  Ankle plantarflexion 45/48 45/48 45/50  40/45  Ankle inversion 38/42 40/50 45/55  30/50  Ankle eversion 5/30 -5/30 20/40 20/45   (Blank rows =  not tested)   LE MMT:   MMT Right 11/01/2021 Left 11/01/2021 Right 11/26/2021 Left 11/26/2021  Hip flexion 5/5 4+/5 5/5 5/5  Hip extension 3/5 3/5 5/5 5/5  Hip abduction 3+/5 4/5 5/5 5/5  Knee flexion 5/5 4+/5 5/5 5/5  Knee extension 5/5 5/5 5/5 5/5  Ankle dorsiflexion 5/5 4/5 5/5 5/5  Ankle plantarflexion 5/5 5/5 5/5 5/5  Ankle inversion 5/5 5/5 5/5 5/5  Ankle eversion 3/5 2+/5 5/5 5/5   (Blank rows = not tested)   SPECIAL TESTS:  Ankle special tests: Tinel's test-Posterior tibialis: negative and Tinel's  test-Deep peroneal: negative              Slump: (+) on Lt            SLR: (+) on Lt   FUNCTIONAL TESTS:  DL heel raise G95x10: decreased heel height with Lt leg weakness and numbness       TODAY'S TREATMENT:  [facility] Adult PT Treatment:                                                DATE: 12/04/2021 Therapeutic Exercise: *** Manual Therapy: *** Neuromuscular re-ed: *** Therapeutic Activity: *** Modalities: *** Self Care: ***   [facility]:                                                DATE: 11/27/2021 Therapeutic Exercise: Squat hops with two 10# cables to waist attachment at Free Motion machine 3x14 5 degree incline forward treadmill walking with GTB around thighs at 2.5mph x3 min 5 degree incline mini-squat side step treadmill walking with GTB around thighs at 0.728mph x3 min BIL Bulgarian split squat stance with single leg soleus heel raises with 10# kettlebell 2x10 BIL Kickstand stance on Airex pad with forward/backward and lateral hand-offs with 5# kettlebell 2x10 BIL Tall-kneeling hip thrust with 33# Free Motion cable to waist attachment and knees on Airex pad 3x12 Standing slant board soleus stretch x2 min Manual Therapy: N/A Neuromuscular re-ed: N/A Therapeutic Activity: N/A Modalities: N/A Self Care: N/A   [facility] Adult PT Treatment:                                                DATE: 11/26/2021 Therapeutic Exercise: Kickstand stance with lead foot on edge of Airex pad, lateral hand-offs with 10# kettlebell 2x20 BIL Seated ankle inversion/ eversion with 6# dumbbell on towel x4 each BIL Tall-kneeling hip thrust with 30# Free Motion cable to waist attachment and knees on Airex pad 3x12 Manual Therapy: N/A Neuromuscular re-ed: N/A Therapeutic Activity: Re-administration of FOTO with pt education Re-assessment of objective information with pt education Modalities: N/A Self Care: N/A      PATIENT EDUCATION:  Education details: Pt educated on  potential underlying pathophysiology contributing to her pain presentation, POC, prognosis, FOTO, and HEP Person educated: Patient Education method: Explanation, Demonstration, and Handouts Education comprehension: verbalized understanding and returned demonstration     HOME EXERCISE PROGRAM: Access Code: [email or web address] URL: [email or web address] Date: 11/01/2021 Prepared by: Carmelina Daneucker Maijor Hornig   Exercises - Standing Heel  Raise with Support  - 1 x daily - 7 x weekly - 3 sets - 10 reps - 3-sec hold - Gastroc Stretch on Wall  - 1 x daily - 7 x weekly - 2-min hold - Soleus Stretch on Wall  - 1 x daily - 7 x weekly - 2-min hold - Seated Toe Towel Scrunches  - 1 x daily - 7 x weekly - 3 sets - 10 reps - Seated Slump Nerve Glide  - 1 x daily - 7 x weekly - 3 sets - 20 reps   ASSESSMENT:   CLINICAL IMPRESSION: ***     OBJECTIVE IMPAIRMENTS Abnormal gait, decreased balance, decreased coordination, decreased endurance, decreased mobility, difficulty walking, decreased ROM, decreased strength, hypomobility, increased edema, impaired flexibility, impaired sensation, improper body mechanics, postural dysfunction, and pain.    ACTIVITY LIMITATIONS cleaning, community activity, driving, occupation, Pharmacologist, yard work, and shopping.    PERSONAL FACTORS Time since onset of injury/illness/exacerbation and 1-2 comorbidities: Lumbar laminectomy 01/07/2017, Pre-diabetic  are also affecting patient's functional outcome.         GOALS: Goals reviewed with patient? Yes   SHORT TERM GOALS: Target date: 11/30/2021   Pt will report understanding and adherence to her HEP in order to promote independence in the management of her primary impairments. Baseline: HEP provided at eval 11/19/2021: Pt reports regular adherence to her HEP Goal status: ACHIEVED     LONG TERM GOALS: Target date: 12/28/2021   Pt will achieve a FOTO score of 80% in order to demonstrate improved functional ability as it  relates to her feet/ ankle impairments. Baseline: 76% 11/26/2021: 66% Goal status: IN PROGRESS   2.  Pt will achieve BIL global ankle strength of 5/5 in order to progress to walking with less limitation. Baseline:  See MMT chart 11/26/2021: 5/5 global ankle strength Goal status: ACHIEVED   3.  Pt will report ability to stand > 60 minutes with 0-2/10 pain in order to go grocery shopping with less limitation. Baseline: 45 minutes with >4/10 pain Goal status: INITIAL   4.  Pt will achieve a 25 DL heel raises with good form in order to progress to a running program with less limitation. Baseline: x10 with increased pain and diminished form Goal status: INITIAL         PLAN: PT FREQUENCY: 1-2x/week   PT DURATION: 8 weeks   PLANNED INTERVENTIONS: Therapeutic exercises, Therapeutic activity, Neuromuscular re-education, Balance training, Gait training, Patient/Family education, Joint manipulation, Joint mobilization, Stair training, Orthotic/Fit training, Aquatic Therapy, Dry Needling, Electrical stimulation, Cryotherapy, Moist heat, Taping, Vasopneumatic device, Biofeedback, Ionotophoresis 4mg /ml Dexamethasone, and Manual therapy   PLAN FOR NEXT SESSION: Progress global LE strengthening/ stretching, along with desensitization/ manual techniques for symptom management    , PT, DPT 12/04/21 3:02 PM

## 2021-12-07 ENCOUNTER — Ambulatory Visit: Payer: 59

## 2021-12-10 ENCOUNTER — Ambulatory Visit: Payer: 59 | Attending: Podiatry

## 2021-12-10 DIAGNOSIS — M6281 Muscle weakness (generalized): Secondary | ICD-10-CM | POA: Diagnosis present

## 2021-12-10 DIAGNOSIS — R262 Difficulty in walking, not elsewhere classified: Secondary | ICD-10-CM | POA: Insufficient documentation

## 2021-12-10 DIAGNOSIS — M79672 Pain in left foot: Secondary | ICD-10-CM | POA: Insufficient documentation

## 2021-12-10 DIAGNOSIS — R202 Paresthesia of skin: Secondary | ICD-10-CM | POA: Diagnosis present

## 2021-12-10 DIAGNOSIS — M79671 Pain in right foot: Secondary | ICD-10-CM | POA: Diagnosis present

## 2021-12-10 NOTE — Therapy (Signed)
OUTPATIENT PHYSICAL THERAPY TREATMENT NOTE   Patient Name: Alexis Hartman MRN: [id number] DOB:Feb 07, 1966, 56 y.o., female Today's Date: 12/10/2021  PCP: Pincus Sanes, MD REFERRING PROVIDER: Louann Sjogren, DPM  END OF SESSION:   PT End of Session - 12/10/21 1612     Visit Number 8    Number of Visits 17    Date for PT Re-Evaluation 01/04/22    Authorization Type [employer]    Authorization Time Period FOTO v6, v10    Progress Note Due on Visit 10    PT Start Time 1615    PT Stop Time 1655    PT Time Calculation (min) 40 min    Activity Tolerance Patient tolerated treatment well    Behavior During Therapy WFL for tasks assessed/performed                   Past Medical History:  Diagnosis Date   Acute bronchitis    Allergic rhinitis, cause unspecified    Asthma    Atypical face pain    Cervicalgia    Diabetes mellitus without complication (HCC)    dx by MD, but no medications taken    Esophageal reflux    hx of   Headache(784.0)    Other convulsions    pseudo seizures  2002- none since then   Pure hypercholesterolemia    no prescription medications taken   Past Surgical History:  Procedure Laterality Date   ABDOMINAL HYSTERECTOMY     CYSTECTOMY  01/2004   Dr. Eda Paschal   LAPAROSCOPIC CHOLECYSTECTOMY  05/2010   Dr. Abbey Chatters   LUMBAR LAMINECTOMY/DECOMPRESSION MICRODISCECTOMY Left 01/07/2017   Procedure: LEFT SIDED LUMBAR 5-SACRUM 1 MICRODISCECTOMY;  Surgeon: Estill Bamberg, MD;  Location: [facility] OR;  Service: Orthopedics;  Laterality: Left;  LEFT SIDED LUMBAR 5-SACRUM 1 MICRODISCECTOMY; REQUEST 2 HOURS AND FLIP ROOM   VESICOVAGINAL FISTULA CLOSURE W/ TAH  01/2004   Dr. 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   Family history of diabetes mellitus (DM) 11/28/2016   Helicobacter pylori  antibody positive 02/10/2011   HYPERCHOLESTEROLEMIA 08/18/2010   Allergic rhinitis 06/30/2007    REFERRING DIAG: G63 (ICD-10-CM) - Polyneuropathy associated with underlying disease (HCC)  THERAPY DIAG:  Pain in left foot  Pain in right foot  Paresthesia of skin  Difficulty in walking, not elsewhere classified  Muscle weakness (generalized)  PERTINENT HISTORY: Lumbar laminectomy 01/07/2017, Pre-diabetic  PRECAUTIONS: None  ONSET DATE: 4 years ago    SUBJECTIVE: Pt reports that her ankles are feeling okay today, but she reports continued Lt upper thigh pain today. She also reports continued HEP adherence.  PAIN:  Are you having pain? Yes: NPRS scale: 7/10 Pain location: Lt posterior thigh Pain description: dull, achy, numb, tingling Aggravating factors: working out (dead lifting, loading back), prolonged standing >45 minutes, sleeping at night (increases Lt thigh pain), laying on Lt side Relieving factors: inversion table, massage, heating pad, elevating legs   OBJECTIVE: (objective measures completed at initial evaluation unless otherwise dated)   DIAGNOSTIC FINDINGS: None related to current problem   PATIENT SURVEYS:  FOTO 76%, projected 80% in 10 visits 11/26/2021: 66%   COGNITION:           Overall cognitive status: Within functional limits for tasks assessed  SENSATION: Light touch: L5-S2 diminished on Lt vs Rt   MUSCLE LENGTH: Hamstrings: Moderate tightness BIL Gastrocnemius: Moderate tightness BIL Soleus: Moderate tightness BIL   POSTURE:  BIL pes cavus   PALPATION: TTP to Lt piriformis with increased Lt sided N/T, TTP to BIL lumbar paraspinals   PASSIVE ACCESSORIES: Lumbar CPA's painful throughout with induced muscle spasms   ROM:    A/PROM Right 11/01/2021 Left 11/01/2021 Right 11/26/2021 Left 11/26/2021  Lumbar extension WNL    Lumbar flexion WNL    Lumbar side flexion 50% WNL    Lumbar rotation 50% WNL    Ankle  dorsiflexion 10/11 5/10 12/15 10/15  Ankle plantarflexion 45/48 45/48 45/50  40/45  Ankle inversion 38/42 40/50 45/55  30/50  Ankle eversion 5/30 -5/30 20/40 20/45   (Blank rows = not tested)   LE MMT:   MMT Right 11/01/2021 Left 11/01/2021 Right 11/26/2021 Left 11/26/2021  Hip flexion 5/5 4+/5 5/5 5/5  Hip extension 3/5 3/5 5/5 5/5  Hip abduction 3+/5 4/5 5/5 5/5  Knee flexion 5/5 4+/5 5/5 5/5  Knee extension 5/5 5/5 5/5 5/5  Ankle dorsiflexion 5/5 4/5 5/5 5/5  Ankle plantarflexion 5/5 5/5 5/5 5/5  Ankle inversion 5/5 5/5 5/5 5/5  Ankle eversion 3/5 2+/5 5/5 5/5   (Blank rows = not tested)   SPECIAL TESTS:  Ankle special tests: Tinel's test-Posterior tibialis: negative and Tinel's test-Deep peroneal: negative              Slump: (+) on Lt            SLR: (+) on Lt   FUNCTIONAL TESTS:  DL heel raise Z61x10: decreased heel height with Lt leg weakness and numbness       TODAY'S TREATMENT:  [facility] Adult PT Treatment:                                                DATE: 12/10/2021 Therapeutic Exercise: Seated active hamstring stretch x2 min on Rt SLS Pallof press with 7# cable 2x10 with 5-sec hold BIL each side  Standing windmill stretch 2x10 BIL Alternating forward lunges with overhead pull with 10# cables 3x20 Dead lifts: x8 with 45# bar, x8 with 135# bar, x6 with 185# barbell Standing Cybex hip abduction with 42.5# cable 2x10 BIL Standing Cybex hip extension with 42.5# cable 2x10 BIL Manual Therapy: N/A Neuromuscular re-ed: N/A Therapeutic Activity: N/A Modalities: N/A Self Care: N/A   [facility] Adult PT Treatment:                                                DATE: 11/27/2021 Therapeutic Exercise: Squat hops with two 10# cables to waist attachment at Free Motion machine 3x14 5 degree incline forward treadmill walking with GTB around thighs at 2.5mph x3 min 5 degree incline mini-squat side step treadmill walking with GTB around thighs at 0.718mph x3 min BIL Bulgarian split  squat stance with single leg soleus heel raises with 10# kettlebell 2x10 BIL Kickstand stance on Airex pad with forward/backward and lateral hand-offs with 5# kettlebell 2x10 BIL Tall-kneeling hip thrust with 33# Free Motion cable to waist attachment and knees on Airex pad 3x12 Standing slant board soleus stretch x2 min Manual Therapy: N/A Neuromuscular re-ed:  N/A Therapeutic Activity: N/A Modalities: N/A Self Care: N/A   [facility] Adult PT Treatment:                                                DATE: 11/26/2021 Therapeutic Exercise: Kickstand stance with lead foot on edge of Airex pad, lateral hand-offs with 10# kettlebell 2x20 BIL Seated ankle inversion/ eversion with 6# dumbbell on towel x4 each BIL Tall-kneeling hip thrust with 30# Free Motion cable to waist attachment and knees on Airex pad 3x12 Manual Therapy: N/A Neuromuscular re-ed: N/A Therapeutic Activity: Re-administration of FOTO with pt education Re-assessment of objective information with pt education Modalities: N/A Self Care: N/A      PATIENT EDUCATION:  Education details: Pt educated on potential underlying pathophysiology contributing to her pain presentation, POC, prognosis, FOTO, and HEP Person educated: Patient Education method: Explanation, Demonstration, and Handouts Education comprehension: verbalized understanding and returned demonstration     HOME EXERCISE PROGRAM: Access Code: [email or web address] URL: [email or web address] Date: 11/01/2021 Prepared by: Carmelina Dane   Exercises - Standing Heel Raise with Support  - 1 x daily - 7 x weekly - 3 sets - 10 reps - 3-sec hold - Gastroc Stretch on Wall  - 1 x daily - 7 x weekly - 2-min hold - Soleus Stretch on Wall  - 1 x daily - 7 x weekly - 2-min hold - Seated Toe Towel Scrunches  - 1 x daily - 7 x weekly - 3 sets - 10 reps - Seated Slump Nerve Glide  - 1 x daily - 7 x weekly - 3 sets - 20 reps   ASSESSMENT:   CLINICAL IMPRESSION: Pt  responded well to progressed exercises today, reporting no pain and only moderate fatigue at the end of the session. She will continue to benefit from skilled PT to address her primary impairments and return to her PLOF.     OBJECTIVE IMPAIRMENTS Abnormal gait, decreased balance, decreased coordination, decreased endurance, decreased mobility, difficulty walking, decreased ROM, decreased strength, hypomobility, increased edema, impaired flexibility, impaired sensation, improper body mechanics, postural dysfunction, and pain.    ACTIVITY LIMITATIONS cleaning, community activity, driving, occupation, Pharmacologist, yard work, and shopping.    PERSONAL FACTORS Time since onset of injury/illness/exacerbation and 1-2 comorbidities: Lumbar laminectomy 01/07/2017, Pre-diabetic  are also affecting patient's functional outcome.         GOALS: Goals reviewed with patient? Yes   SHORT TERM GOALS: Target date: 11/30/2021   Pt will report understanding and adherence to her HEP in order to promote independence in the management of her primary impairments. Baseline: HEP provided at eval 11/19/2021: Pt reports regular adherence to her HEP Goal status: ACHIEVED     LONG TERM GOALS: Target date: 12/28/2021   Pt will achieve a FOTO score of 80% in order to demonstrate improved functional ability as it relates to her feet/ ankle impairments. Baseline: 76% 11/26/2021: 66% Goal status: IN PROGRESS   2.  Pt will achieve BIL global ankle strength of 5/5 in order to progress to walking with less limitation. Baseline:  See MMT chart 11/26/2021: 5/5 global ankle strength Goal status: ACHIEVED   3.  Pt will report ability to stand > 60 minutes with 0-2/10 pain in order to go grocery shopping with less limitation. Baseline: 45 minutes with >4/10 pain Goal status: INITIAL   4.  Pt will achieve  a 25 DL heel raises with good form in order to progress to a running program with less limitation. Baseline: x10 with increased  pain and diminished form Goal status: INITIAL         PLAN: PT FREQUENCY: 1-2x/week   PT DURATION: 8 weeks   PLANNED INTERVENTIONS: Therapeutic exercises, Therapeutic activity, Neuromuscular re-education, Balance training, Gait training, Patient/Family education, Joint manipulation, Joint mobilization, Stair training, Orthotic/Fit training, Aquatic Therapy, Dry Needling, Electrical stimulation, Cryotherapy, Moist heat, Taping, Vasopneumatic device, Biofeedback, Ionotophoresis 4mg /ml Dexamethasone, and Manual therapy   PLAN FOR NEXT SESSION: Progress global LE strengthening/ stretching, along with desensitization/ manual techniques for symptom management    , PT, DPT 12/10/21 4:55 PM

## 2021-12-14 ENCOUNTER — Ambulatory Visit: Payer: 59

## 2021-12-14 DIAGNOSIS — M79672 Pain in left foot: Secondary | ICD-10-CM | POA: Diagnosis not present

## 2021-12-14 DIAGNOSIS — M6281 Muscle weakness (generalized): Secondary | ICD-10-CM

## 2021-12-14 DIAGNOSIS — M79671 Pain in right foot: Secondary | ICD-10-CM

## 2021-12-14 DIAGNOSIS — R262 Difficulty in walking, not elsewhere classified: Secondary | ICD-10-CM

## 2021-12-14 DIAGNOSIS — R202 Paresthesia of skin: Secondary | ICD-10-CM

## 2021-12-14 NOTE — Therapy (Signed)
OUTPATIENT PHYSICAL THERAPY TREATMENT NOTE   Patient Name: Alexis Hartman MRN: [id number] DOB:February 12, 1966, 56 y.o., female Today's Date: 12/14/2021  PCP: Pincus Sanes, MD REFERRING PROVIDER: Louann Sjogren, DPM  END OF SESSION:   PT End of Session - 12/14/21 0951     Visit Number 9    Number of Visits 17    Date for PT Re-Evaluation 01/04/22    Authorization Type UHC    Authorization Time Period FOTO v6, v10    Progress Note Due on Visit 10    PT Start Time 0950    PT Stop Time 1028    PT Time Calculation (min) 38 min    Activity Tolerance Patient tolerated treatment well    Behavior During Therapy WFL for tasks assessed/performed                    Past Medical History:  Diagnosis Date   Acute bronchitis    Allergic rhinitis, cause unspecified    Asthma    Atypical face pain    Cervicalgia    Diabetes mellitus without complication (HCC)    dx by MD, but no medications taken    Esophageal reflux    hx of   Headache(784.0)    Other convulsions    pseudo seizures  2002- none since then   Pure hypercholesterolemia    no prescription medications taken   Past Surgical History:  Procedure Laterality Date   ABDOMINAL HYSTERECTOMY     CYSTECTOMY  01/2004   Dr. Eda Paschal   LAPAROSCOPIC CHOLECYSTECTOMY  05/2010   Dr. Abbey Chatters   LUMBAR LAMINECTOMY/DECOMPRESSION MICRODISCECTOMY Left 01/07/2017   Procedure: LEFT SIDED LUMBAR 5-SACRUM 1 MICRODISCECTOMY;  Surgeon: Estill Bamberg, MD;  Location: [facility];  Service: Orthopedics;  Laterality: Left;  LEFT SIDED LUMBAR 5-SACRUM 1 MICRODISCECTOMY; REQUEST 2 HOURS AND FLIP ROOM   VESICOVAGINAL FISTULA CLOSURE W/ TAH  01/2004   Dr. 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 She arrives to clinic from a HIIT workout this morning at the gym.   PAIN:  Are you having pain? Yes: NPRS scale: 6/10 Pain location: Lt posterior thigh Pain description: dull, achy, numb, tingling Aggravating factors: working out (dead lifting, loading back), prolonged standing >45 minutes, sleeping at night (increases Lt thigh pain), laying on Lt side Relieving factors: inversion table, massage, heating pad, elevating legs   OBJECTIVE: (objective measures completed at initial evaluation unless otherwise dated)   DIAGNOSTIC FINDINGS: None related to current problem   PATIENT SURVEYS:  FOTO 76%, projected 80% in 10 visits 11/26/2021: 66%   COGNITION:           Overall cognitive status: Within functional limits for tasks assessed  SENSATION: Light touch: L5-S2 diminished on Lt vs Rt   MUSCLE LENGTH: Hamstrings: Moderate tightness BIL Gastrocnemius: Moderate tightness BIL Soleus: Moderate tightness BIL   POSTURE:  BIL pes cavus   PALPATION: TTP to Lt piriformis with increased Lt sided N/T, TTP to BIL lumbar paraspinals   PASSIVE ACCESSORIES: Lumbar CPA's painful throughout with induced muscle spasms   ROM:    A/PROM Right 11/01/2021 Left 11/01/2021 Right 11/26/2021 Left 11/26/2021  Lumbar extension WNL    Lumbar flexion WNL    Lumbar side flexion 50% WNL    Lumbar rotation 50% WNL    Ankle dorsiflexion 10/11 5/10  12/15 10/15  Ankle plantarflexion 45/48 45/48 45/50  40/45  Ankle inversion 38/42 40/50 45/55  30/50  Ankle eversion 5/30 -5/30 20/40 20/45   (Blank rows = not tested)   LE MMT:   MMT Right 11/01/2021 Left 11/01/2021 Right 11/26/2021 Left 11/26/2021  Hip flexion 5/5 4+/5 5/5 5/5  Hip extension 3/5 3/5 5/5 5/5  Hip abduction 3+/5 4/5 5/5 5/5  Knee flexion 5/5 4+/5 5/5 5/5  Knee extension 5/5 5/5 5/5 5/5  Ankle dorsiflexion 5/5 4/5 5/5 5/5  Ankle plantarflexion 5/5 5/5 5/5 5/5  Ankle inversion 5/5 5/5 5/5 5/5  Ankle eversion 3/5 2+/5 5/5 5/5   (Blank rows = not tested)   SPECIAL TESTS:  Ankle special tests: Tinel's test-Posterior tibialis: negative and Tinel's test-Deep peroneal: negative              Slump: (+) on Lt            SLR: (+) on Lt   FUNCTIONAL TESTS:  DL heel raise 11/28/2021: decreased heel height with Lt leg weakness and numbness       TODAY'S TREATMENT:  [facility] Adult PT Treatment:                                                DATE: 12/14/2021 Therapeutic Exercise: Supine straight leg hamstring stretch with strap x52min Seated hamstring curl machine with 55#, 3x10 with slow, eccentric return  Plank with 25# plate on back with alternating hip extension 3x20 12/16/2021 split squat hop with 15# kettlebell and contralateral UE support 2x10 BIL Prone hip flexor/ contralateral hamstring stretch on side of table x2 min Manual Therapy: N/A Neuromuscular re-ed: N/A Therapeutic Activity: 3m dead lift with hex bar x10 with 95#, x8 with 135#, x6 with 185# Modalities: N/A Self Care: N/A   [facility] Adult PT Treatment:                                                DATE: 12/10/2021 Therapeutic Exercise: Seated active hamstring stretch x2 min on Rt SLS Pallof press with 7# cable 2x10 with 5-sec hold BIL each side  Standing windmill stretch 2x10 BIL Alternating forward lunges with overhead pull with 10# cables 3x20 Dead lifts: x8 with 45# bar, x8 with 135# bar, x6 with 185#  barbell Standing Cybex hip abduction with 42.5# cable 2x10 BIL Standing Cybex hip extension with 42.5# cable 2x10 BIL Manual Therapy: N/A Neuromuscular re-ed: N/A Therapeutic Activity: N/A Modalities: N/A Self Care: N/A   [facility] Adult PT Treatment:  DATE: 11/27/2021 Therapeutic Exercise: Squat hops with two 10# cables to waist attachment at Free Motion machine 3x14 5 degree incline forward treadmill walking with GTB around thighs at 2. x3 min 5 degree incline mini-squat side step treadmill walking with GTB around thighs at 0.60mph x3 min BIL Bulgarian split squat stance with single leg soleus heel raises with 10# kettlebell 2x10 BIL Kickstand stance on Airex pad with forward/backward and lateral hand-offs with 5# kettlebell 2x10 BIL Tall-kneeling hip thrust with 33# Free Motion cable to waist attachment and knees on Airex pad 3x12 Standing slant board soleus stretch x2 min Manual Therapy: N/A Neuromuscular re-ed: N/A Therapeutic Activity: N/A Modalities: N/A Self Care: N/A     PATIENT EDUCATION:  Education details: Pt educated on potential underlying pathophysiology contributing to her pain presentation, POC, prognosis, FOTO, and HEP Person educated: Patient Education method: Explanation, Demonstration, and Handouts Education comprehension: verbalized understanding and returned demonstration     HOME EXERCISE PROGRAM: Access Code: [email or web address] URL: [email or web address] Date: 11/01/2021 Prepared by: Carmelina Dane   Exercises - Standing Heel Raise with Support  - 1 x daily - 7 x weekly - 3 sets - 10 reps - 3-sec hold - Gastroc Stretch on Wall  - 1 x daily - 7 x weekly - 2-min hold - Soleus Stretch on Wall  - 1 x daily - 7 x weekly - 2-min hold - Seated Toe Towel Scrunches  - 1 x daily - 7 x weekly - 3 sets - 10 reps - Seated Slump Nerve Glide  - 1 x daily - 7 x weekly - 3 sets - 20 reps   ASSESSMENT:    CLINICAL IMPRESSION: Pt responded excellently to all interventions today, demonstrating good form and no increase in pain with progressed exercises. She also reports a therapeutic response to stretching today. Pt will continue to benefit from skilled PT to address her primary impairments and return to her prior level of function with less limitation.     OBJECTIVE IMPAIRMENTS Abnormal gait, decreased balance, decreased coordination, decreased endurance, decreased mobility, difficulty walking, decreased ROM, decreased strength, hypomobility, increased edema, impaired flexibility, impaired sensation, improper body mechanics, postural dysfunction, and pain.    ACTIVITY LIMITATIONS cleaning, community activity, driving, occupation, Pharmacologist, yard work, and shopping.    PERSONAL FACTORS Time since onset of injury/illness/exacerbation and 1-2 comorbidities: Lumbar laminectomy 01/07/2017, Pre-diabetic  are also affecting patient's functional outcome.         GOALS: Goals reviewed with patient? Yes   SHORT TERM GOALS: Target date: 11/30/2021   Pt will report understanding and adherence to her HEP in order to promote independence in the management of her primary impairments. Baseline: HEP provided at eval 11/19/2021: Pt reports regular adherence to her HEP Goal status: ACHIEVED     LONG TERM GOALS: Target date: 12/28/2021   Pt will achieve a FOTO score of 80% in order to demonstrate improved functional ability as it relates to her feet/ ankle impairments. Baseline: 76% 11/26/2021: 66% Goal status: IN PROGRESS   2.  Pt will achieve BIL global ankle strength of 5/5 in order to progress to walking with less limitation. Baseline:  See MMT chart 11/26/2021: 5/5 global ankle strength Goal status: ACHIEVED   3.  Pt will report ability to stand > 60 minutes with 0-2/10 pain in order to go grocery shopping with less limitation. Baseline: 45 minutes with >4/10 pain Goal status: INITIAL   4.  Pt will  achieve a 25 DL heel raises with good form  in order to progress to a running program with less limitation. Baseline: x10 with increased pain and diminished form Goal status: INITIAL         PLAN: PT FREQUENCY: 1-2x/week   PT DURATION: 8 weeks   PLANNED INTERVENTIONS: Therapeutic exercises, Therapeutic activity, Neuromuscular re-education, Balance training, Gait training, Patient/Family education, Joint manipulation, Joint mobilization, Stair training, Orthotic/Fit training, Aquatic Therapy, Dry Needling, Electrical stimulation, Cryotherapy, Moist heat, Taping, Vasopneumatic device, Biofeedback, Ionotophoresis 4mg /ml Dexamethasone, and Manual therapy   PLAN FOR NEXT SESSION: Progress global LE strengthening/ stretching, along with desensitization/ manual techniques for symptom management    , PT, DPT 12/14/21 10:28 AM

## 2021-12-17 ENCOUNTER — Ambulatory Visit: Payer: 59

## 2021-12-17 DIAGNOSIS — R262 Difficulty in walking, not elsewhere classified: Secondary | ICD-10-CM

## 2021-12-17 DIAGNOSIS — M79672 Pain in left foot: Secondary | ICD-10-CM | POA: Diagnosis not present

## 2021-12-17 DIAGNOSIS — M79671 Pain in right foot: Secondary | ICD-10-CM

## 2021-12-17 DIAGNOSIS — M6281 Muscle weakness (generalized): Secondary | ICD-10-CM

## 2021-12-17 DIAGNOSIS — R202 Paresthesia of skin: Secondary | ICD-10-CM

## 2021-12-17 NOTE — Therapy (Signed)
OUTPATIENT PHYSICAL THERAPY TREATMENT NOTE/ DISCHARGE SUMMARY   Patient Name: Alexis Hartman MRN: [id number] DOB:1965-12-04, 56 y.o., female Today's Date: 12/17/2021  PCP: Binnie Rail, MD REFERRING PROVIDER: Lorenda Peck, DPM  END OF SESSION:   PT End of Session - 12/17/21 1705     Visit Number 10    Number of Visits 17    Date for PT Re-Evaluation 01/04/22    Authorization Type UHC    Authorization Time Period FOTO v6, v10    Progress Note Due on Visit 10    PT Start Time 1703    PT Stop Time 1742    PT Time Calculation (min) 39 min    Activity Tolerance Patient tolerated treatment well    Behavior During Therapy WFL for tasks assessed/performed                     Past Medical History:  Diagnosis Date   Acute bronchitis    Allergic rhinitis, cause unspecified    Asthma    Atypical face pain    Cervicalgia    Diabetes mellitus without complication ([place])    dx by MD, but no medications taken    Esophageal reflux    hx of   Headache(784.0)    Other convulsions    pseudo seizures  2002- none since then   Pure hypercholesterolemia    no prescription medications taken   Past Surgical History:  Procedure Laterality Date   ABDOMINAL HYSTERECTOMY     CYSTECTOMY  01/2004   Dr. Cherylann Banas   LAPAROSCOPIC CHOLECYSTECTOMY  05/2010   Dr. Zella Richer   LUMBAR LAMINECTOMY/DECOMPRESSION MICRODISCECTOMY Left 01/07/2017   Procedure: LEFT SIDED LUMBAR 5-SACRUM 1 MICRODISCECTOMY;  Surgeon: Phylliss Bob, MD;  Location: [place];  Service: Orthopedics;  Laterality: Left;  LEFT SIDED LUMBAR 5-SACRUM 1 MICRODISCECTOMY; REQUEST 2 HOURS AND FLIP ROOM   VESICOVAGINAL FISTULA CLOSURE W/ TAH  01/2004   Dr. Cherylann Banas   Patient Active Problem List   Diagnosis Date Noted   Myalgia due to statin 10/14/2021   Decreased sensation 10/14/2021   Diabetes ([place]) 12/02/2017   Asthma 11/28/2016   Pseudoseizures 11/28/2016   Family history of diabetes mellitus (DM) 97/07/6376    Helicobacter pylori antibody positive 02/10/2011   HYPERCHOLESTEROLEMIA 08/18/2010   Allergic rhinitis 06/30/2007    REFERRING DIAG: G63 (ICD-10-CM) - Polyneuropathy associated with underlying disease ([place])  THERAPY DIAG:  Pain in left foot  Pain in right foot  Paresthesia of skin  Difficulty in walking, not elsewhere classified  Muscle weakness (generalized)  PERTINENT HISTORY: Lumbar laminectomy 01/07/2017, Pre-diabetic  PRECAUTIONS: None  ONSET DATE: 4 years ago    SUBJECTIVE: Pt reports mild Lt posterior thigh pain today, but denies any foot/ ankle symptoms the past few weeks. She reports feeling ready for discharge from PT at this time.  PAIN:  Are you having pain? Yes: NPRS scale: 4/10 Pain location: Lt posterior thigh Pain description: dull, achy, numb, tingling Aggravating factors: working out (dead lifting, loading back), prolonged standing >45 minutes, sleeping at night (increases Lt thigh pain), laying on Lt side Relieving factors: inversion table, massage, heating pad, elevating legs   OBJECTIVE: (objective measures completed at initial evaluation unless otherwise dated)   DIAGNOSTIC FINDINGS: None related to current problem   PATIENT SURVEYS:  FOTO 76%, projected 80% in 10 visits 11/26/2021: 66% 12/17/2021: 91% (GOAL MET)   COGNITION:           Overall cognitive status: Within functional limits for  tasks assessed                          SENSATION: Light touch: L5-S2 diminished on Lt vs Rt   MUSCLE LENGTH: Hamstrings: Moderate tightness BIL Gastrocnemius: Moderate tightness BIL Soleus: Moderate tightness BIL   POSTURE:  BIL pes cavus   PALPATION: TTP to Lt piriformis with increased Lt sided N/T, TTP to BIL lumbar paraspinals   PASSIVE ACCESSORIES: Lumbar CPA's painful throughout with induced muscle spasms   ROM:    A/PROM Right 11/01/2021 Left 11/01/2021 Right 11/26/2021 Left 11/26/2021  Lumbar extension WNL    Lumbar flexion WNL    Lumbar  side flexion 50% WNL    Lumbar rotation 50% WNL    Ankle dorsiflexion 10/11 5/10 12/15 10/15  Ankle plantarflexion _0 40/45  Ankle inversion _1 30/50  Ankle eversion 5/30 -5/30 20/40 20/45   (Blank rows = not tested)   LE MMT:   MMT Right 11/01/2021 Left 11/01/2021 Right 11/26/2021 Left 11/26/2021  Hip flexion 5/5 4+/5 5/5 5/5  Hip extension 3/5 3/5 5/5 5/5  Hip abduction 3+/5 4/5 5/5 5/5  Knee flexion 5/5 4+/5 5/5 5/5  Knee extension 5/5 5/5 5/5 5/5  Ankle dorsiflexion 5/5 4/5 5/5 5/5  Ankle plantarflexion 5/5 5/5 5/5 5/5  Ankle inversion 5/5 5/5 5/5 5/5  Ankle eversion 3/5 2+/5 5/5 5/5   (Blank rows = not tested)   SPECIAL TESTS:  Ankle special tests: Tinel's test-Posterior tibialis: negative and Tinel's test-Deep peroneal: negative              Slump: (+) on Lt            SLR: (+) on Lt   FUNCTIONAL TESTS:  DL heel raise x10: decreased heel height with Lt leg weakness and numbness       12/17/2021: x25 WNL     TODAY'S TREATMENT:  [facility] Adult PT Treatment:                                                DATE: 12/17/2021 Therapeutic Exercise: Sidelying trunk rotation with GTB resistance into side knee plank 2x10 BIL Tall-kneeling hip thrusts on Airex pad with 37# cable to waist attachment at [employer] SL heel raise isometric with 3-point balance clocks 2x5 BIL Kettlebell swing with heel raise variation 3x12 Manual Therapy: N/A Neuromuscular re-ed: N/A Therapeutic Activity: Re-administration of FOTO with pt education Re-assessment of objective measures with pt education Modalities: N/A Self Care: N/A   [facility] Adult PT Treatment:                                                DATE: 12/14/2021 Therapeutic Exercise: Supine straight leg hamstring stretch with strap x34mn Seated hamstring curl machine with 55#, 3x10 with slow, eccentric return  Plank with 25# plate on back with alternating hip extension 3x20 [place]  squat hop with 15# kettlebell and contralateral UE support 2x10 BIL Prone hip flexor/ contralateral hamstring stretch on side of table x2 min Manual Therapy: N/A Neuromuscular re-ed: N/A Therapeutic Activity: RBenindead lift with hex bar x10 with 95#, x8 with 135#, x6 with 185# Modalities: N/A Self  Care: N/A   [facility] Adult PT Treatment:                                                DATE: 12/10/2021 Therapeutic Exercise: Seated active hamstring stretch x2 min on Rt SLS Pallof press with 7# cable 2x10 with 5-sec hold BIL each side  Standing windmill stretch 2x10 BIL Alternating forward lunges with overhead pull with 10# cables 3x20 Dead lifts: x8 with 45# bar, x8 with 135# bar, x6 with 185# barbell Standing Cybex hip abduction with 42.5# cable 2x10 BIL Standing Cybex hip extension with 42.5# cable 2x10 BIL Manual Therapy: N/A Neuromuscular re-ed: N/A Therapeutic Activity: N/A Modalities: N/A Self Care: N/A     PATIENT EDUCATION:  Education details: Pt educated on potential underlying pathophysiology contributing to her pain presentation, POC, prognosis, FOTO, and HEP Person educated: Patient Education method: Explanation, Demonstration, and Handouts Education comprehension: verbalized understanding and returned demonstration     HOME EXERCISE PROGRAM: Access Code: [email or web address] URL: [email or web address] Date: 11/01/2021 Prepared by: Vanessa [place]   Exercises - Standing Heel Raise with Support  - 1 x daily - 7 x weekly - 3 sets - 10 reps - 3-sec hold - Gastroc Stretch on Wall  - 1 x daily - 7 x weekly - 2-min hold - Soleus Stretch on Wall  - 1 x daily - 7 x weekly - 2-min hold - Seated Toe Towel Scrunches  - 1 x daily - 7 x weekly - 3 sets - 10 reps - Seated Slump Nerve Glide  - 1 x daily - 7 x weekly - 3 sets - 20 reps   ASSESSMENT:   CLINICAL IMPRESSION: Pt responded excellently to all interventions today, demonstrating good form and no  increase in pain with progressed exercises. Upon re-assessment, the pt has met all of her rehab goals, making excellent progress in functional ankle strength, standing capacity, and FOTO score. Due to these factors, along with pt report of feeling ready for discharge, the pt is discharged from PT at this time.     OBJECTIVE IMPAIRMENTS Abnormal gait, decreased balance, decreased coordination, decreased endurance, decreased mobility, difficulty walking, decreased ROM, decreased strength, hypomobility, increased edema, impaired flexibility, impaired sensation, improper body mechanics, postural dysfunction, and pain.    ACTIVITY LIMITATIONS cleaning, community activity, driving, occupation, Medical sales representative, yard work, and shopping.    PERSONAL FACTORS Time since onset of injury/illness/exacerbation and 1-2 comorbidities: Lumbar laminectomy 01/07/2017, Pre-diabetic  are also affecting patient's functional outcome.         GOALS: Goals reviewed with patient? Yes   SHORT TERM GOALS: Target date: 11/30/2021   Pt will report understanding and adherence to her HEP in order to promote independence in the management of her primary impairments. Baseline: HEP provided at eval 11/19/2021: Pt reports regular adherence to her HEP Goal status: ACHIEVED     LONG TERM GOALS: Target date: 12/28/2021   Pt will achieve a FOTO score of 80% in order to demonstrate improved functional ability as it relates to her feet/ ankle impairments. Baseline: 76% 11/26/2021: 66% 12/17/2021: 90% Goal status: ACHIEVED   2.  Pt will achieve BIL global ankle strength of 5/5 in order to progress to walking with less limitation. Baseline:  See MMT chart 11/26/2021: 5/5 global ankle strength Goal status: ACHIEVED   3.  Pt will report ability to stand > 60  minutes with 0-2/10 pain in order to go grocery shopping with less limitation. Baseline: 45 minutes with >4/10 pain 12/17/2021: Pt reports being able to stand any amount of time without  increase in pain Goal status: ACHIEVED   4.  Pt will achieve a 25 DL heel raises with good form in order to progress to a running program with less limitation. Baseline: x10 with increased pain and diminished form 12/17/2021: x25 WNL Goal status: ACHIEVED         PLAN: PT FREQUENCY: 1-2x/week   PT DURATION: 8 weeks   PLANNED INTERVENTIONS: Therapeutic exercises, Therapeutic activity, Neuromuscular re-education, Balance training, Gait training, Patient/Family education, Joint manipulation, Joint mobilization, Stair training, Orthotic/Fit training, Aquatic Therapy, Dry Needling, Electrical stimulation, Cryotherapy, Moist heat, Taping, Vasopneumatic device, Biofeedback, Ionotophoresis 12m/ml Dexamethasone, and Manual therapy   PLAN FOR NEXT SESSION: Pt is discharged from PT at this time.   PHYSICAL THERAPY DISCHARGE SUMMARY  Visits from Start of Care: 10  Current functional level related to goals / functional outcomes: Pt has met all of her functional rehab goals.   Remaining deficits: Continued baseline Lt posterior thigh pain   Education / Equipment: HEP   Patient agrees to discharge. Patient goals were met. Patient is being discharged due to meeting the stated rehab goals.     YVanessa [place] PT, DPT 12/17/21 5:42 PM

## 2021-12-21 ENCOUNTER — Ambulatory Visit: Payer: 59

## 2021-12-25 ENCOUNTER — Ambulatory Visit: Payer: 59

## 2022-03-25 ENCOUNTER — Encounter: Payer: Self-pay | Admitting: Podiatry

## 2022-03-25 ENCOUNTER — Ambulatory Visit (INDEPENDENT_AMBULATORY_CARE_PROVIDER_SITE_OTHER): Payer: 59 | Admitting: Podiatry

## 2022-03-25 DIAGNOSIS — E1169 Type 2 diabetes mellitus with other specified complication: Secondary | ICD-10-CM

## 2022-03-25 DIAGNOSIS — G63 Polyneuropathy in diseases classified elsewhere: Secondary | ICD-10-CM

## 2022-03-25 DIAGNOSIS — B351 Tinea unguium: Secondary | ICD-10-CM | POA: Diagnosis not present

## 2022-03-25 NOTE — Progress Notes (Signed)
  Subjective:  Patient ID: Alexis Hartman, female    DOB: September 27, 1965,   MRN: [id number]  No chief complaint on file.   56 y.o. female presents for follow-up of neuropathy and fungal toenails. She has been using the penlac.  Last A1c was 6.4.   Denies any other pedal complaints. Denies n/v/f/c.   Past Medical History:  Diagnosis Date   Acute bronchitis    Allergic rhinitis, cause unspecified    Asthma    Atypical face pain    Cervicalgia    Diabetes mellitus without complication ([place])    dx by MD, but no medications taken    Esophageal reflux    hx of   Headache(784.0)    Other convulsions    pseudo seizures  2002- none since then   Pure hypercholesterolemia    no prescription medications taken    Objective:  Physical Exam: Vascular: DP/PT pulses 2/4 bilateral. CFT <3 seconds. Normal hair growth on digits. No edema.  Skin. No lacerations or abrasions bilateral feet. Right hallux nail thickened and discolored with subungual debris distally.  Musculoskeletal: MMT 5/5 bilateral lower extremities in DF, PF, Inversion and Eversion. Deceased ROM in DF of ankle joint.  Neurological: Sensation intact to light touch. Protective sensation diminished.   Assessment:   1. Polyneuropathy associated with underlying disease ([place])   2. Onychomycosis      Plan:  Patient was evaluated and treated and all questions answered. Discussed neuropathy and etiology as well as treatment with patient.  Radiographs reviewed and discussed with patient.  -Discussed and educated patient on diabetic foot care, especially with  regards to the vascular, neurological and musculoskeletal systems.  -Stressed the importance of good glycemic control and the detriment of not  controlling glucose levels in relation to the foot. -Discussed supportive shoes at all times and checking feet regularly.  -Nails 1-5 b/l debrided without incident.  -Return in 3 months for rfc.     Lorenda Peck, DPM

## 2022-04-15 NOTE — Patient Instructions (Addendum)
Gyn - Dr Talbert Nan - on [facility]   Flu immunization administered today.      Blood work was ordered.   The lab is on the first floor.    Medications changes include :   none     Return in about 6 months (around 10/16/2022) for follow up.   Health Maintenance, Female Adopting a healthy lifestyle and getting preventive care are important in promoting health and wellness. Ask your health care provider about: The right schedule for you to have regular tests and exams. Things you can do on your own to prevent diseases and keep yourself healthy. What should I know about diet, weight, and exercise? Eat a healthy diet  Eat a diet that includes plenty of vegetables, fruits, low-fat dairy products, and lean protein. Do not eat a lot of foods that are high in solid fats, added sugars, or sodium. Maintain a healthy weight Body mass index (BMI) is used to identify weight problems. It estimates body fat based on height and weight. Your health care provider can help determine your BMI and help you achieve or maintain a healthy weight. Get regular exercise Get regular exercise. This is one of the most important things you can do for your health. Most adults should: Exercise for at least 150 minutes each week. The exercise should increase your heart rate and make you sweat (moderate-intensity exercise). Do strengthening exercises at least twice a week. This is in addition to the moderate-intensity exercise. Spend less time sitting. Even light physical activity can be beneficial. Watch cholesterol and blood lipids Have your blood tested for lipids and cholesterol at 56 years of age, then have this test every 5 years. Have your cholesterol levels checked more often if: Your lipid or cholesterol levels are high. You are older than 56 years of age. You are at high risk for heart disease. What should I know about cancer screening? Depending on your health history and family history, you  may need to have cancer screening at various ages. This may include screening for: Breast cancer. Cervical cancer. Colorectal cancer. Skin cancer. Lung cancer. What should I know about heart disease, diabetes, and high blood pressure? Blood pressure and heart disease High blood pressure causes heart disease and increases the risk of stroke. This is more likely to develop in people who have high blood pressure readings or are overweight. Have your blood pressure checked: Every 3-5 years if you are 57-45 years of age. Every year if you are 74 years old or older. Diabetes Have regular diabetes screenings. This checks your fasting blood sugar level. Have the screening done: Once every three years after age 71 if you are at a normal weight and have a low risk for diabetes. More often and at a younger age if you are overweight or have a high risk for diabetes. What should I know about preventing infection? Hepatitis B If you have a higher risk for hepatitis B, you should be screened for this virus. Talk with your health care provider to find out if you are at risk for hepatitis B infection. Hepatitis C Testing is recommended for: Everyone born from [birth year] through 1965. Anyone with known risk factors for hepatitis C. Sexually transmitted infections (STIs) Get screened for STIs, including gonorrhea and chlamydia, if: You are sexually active and are younger than 56 years of age. You are older than 56 years of age and your health care provider tells you that you are at risk for this  type of infection. Your sexual activity has changed since you were last screened, and you are at increased risk for chlamydia or gonorrhea. Ask your health care provider if you are at risk. Ask your health care provider about whether you are at high risk for HIV. Your health care provider may recommend a prescription medicine to help prevent HIV infection. If you choose to take medicine to prevent HIV, you should first  get tested for HIV. You should then be tested every 3 months for as long as you are taking the medicine. Pregnancy If you are about to stop having your period (premenopausal) and you may become pregnant, seek counseling before you get pregnant. Take 400 to 800 micrograms (mcg) of folic acid every day if you become pregnant. Ask for birth control (contraception) if you want to prevent pregnancy. Osteoporosis and menopause Osteoporosis is a disease in which the bones lose minerals and strength with aging. This can result in bone fractures. If you are 50 years old or older, or if you are at risk for osteoporosis and fractures, ask your health care provider if you should: Be screened for bone loss. Take a calcium or vitamin D supplement to lower your risk of fractures. Be given hormone replacement therapy (HRT) to treat symptoms of menopause. Follow these instructions at home: Alcohol use Do not drink alcohol if: Your health care provider tells you not to drink. You are pregnant, may be pregnant, or are planning to become pregnant. If you drink alcohol: Limit how much you have to: 0-1 drink a day. Know how much alcohol is in your drink. In the [place] one drink equals one 12 oz bottle of beer (355 mL), one 5 oz glass of wine (148 mL), or one 1 oz glass of hard liquor (44 mL). Lifestyle Do not use any products that contain nicotine or tobacco. These products include cigarettes, chewing tobacco, and vaping devices, such as e-cigarettes. If you need help quitting, ask your health care provider. Do not use street drugs. Do not share needles. Ask your health care provider for help if you need support or information about quitting drugs. General instructions Schedule regular health, dental, and eye exams. Stay current with your vaccines. Tell your health care provider if: You often feel depressed. You have ever been abused or do not feel safe at home. Summary Adopting a healthy lifestyle and  getting preventive care are important in promoting health and wellness. Follow your health care provider's instructions about healthy diet, exercising, and getting tested or screened for diseases. Follow your health care provider's instructions on monitoring your cholesterol and blood pressure. This information is not intended to replace advice given to you by your health care provider. Make sure you discuss any questions you have with your health care provider. Document Revised: 11/05/2020 Document Reviewed: 11/05/2020 Elsevier Patient Education  [place].

## 2022-04-15 NOTE — Progress Notes (Unsigned)
Subjective:    Patient ID: Alexis Hartman, female    DOB: 1966-05-21, 56 y.o.   MRN: [id number]      HPI Alexis Hartman is here for a Physical exam.      Medications and allergies reviewed with patient and updated if appropriate.  Current Outpatient Medications on File Prior to Visit  Medication Sig Dispense Refill   acetaminophen (TYLENOL) 500 MG tablet Take 1,000 mg by mouth every 6 (six) hours as needed for moderate pain.     albuterol (VENTOLIN HFA) 108 (90 Base) MCG/ACT inhaler Inhale 2 puffs into the lungs every 6 (six) hours as needed for wheezing or shortness of breath. 8 g 11   Cholecalciferol (VITAMIN D) 2000 UNITS CAPS Take 1 capsule by mouth daily.     ciclopirox (PENLAC) 8 % solution Apply topically at bedtime. Apply over nail and surrounding skin. Apply daily over previous coat. After seven (7) days, may remove with alcohol and continue cycle. 6.6 mL 0   Multiple Vitamin (MULTIVITAMIN) tablet Take 1 tablet by mouth daily.     Omega-3 Fatty Acids (OMEGA 3 PO) Take by mouth daily.     Polyethylene Glycol 3350 (MIRALAX PO) Take by mouth as needed.     No current facility-administered medications on file prior to visit.    Review of Systems     Objective:  There were no vitals filed for this visit. There were no vitals filed for this visit. There is no height or weight on file to calculate BMI.  BP Readings from Last 3 Encounters:  10/14/21 126/82  04/12/21 128/74  09/11/20 126/72    Wt Readings from Last 3 Encounters:  10/14/21 184 lb (83.5 kg)  04/12/21 190 lb (86.2 kg)  09/11/20 189 lb (85.7 kg)       Physical Exam Constitutional: She appears well-developed and well-nourished. No distress.  HENT:  Head: Normocephalic and atraumatic.  Right Ear: External ear normal. Normal ear canal and TM Left Ear: External ear normal.  Normal ear canal and TM Mouth/Throat: Oropharynx is clear and moist.  Eyes: Conjunctivae normal.  Neck: Neck supple. No tracheal  deviation present. No thyromegaly present.  No carotid bruit  Cardiovascular: Normal rate, regular rhythm and normal heart sounds.   No murmur heard.  No edema. Pulmonary/Chest: Effort normal and breath sounds normal. No respiratory distress. She has no wheezes. She has no rales.  Breast: deferred   Abdominal: Soft. She exhibits no distension. There is no tenderness.  Lymphadenopathy: She has no cervical adenopathy.  Skin: Skin is warm and dry. She is not diaphoretic.  Psychiatric: She has a normal mood and affect. Her behavior is normal.     Lab Results  Component Value Date   WBC 6.1 04/12/2021   HGB 13.2 04/12/2021   HCT 40.1 04/12/2021   PLT 266.0 04/12/2021   GLUCOSE 102 (H) 10/14/2021   CHOL 235 (H) 10/14/2021   TRIG 59.0 10/14/2021   HDL 83.10 10/14/2021   LDLDIRECT 164.7 05/13/2013   LDLCALC 140 (H) 10/14/2021   ALT 20 10/14/2021   AST 31 10/14/2021   NA 143 10/14/2021   K 4.0 10/14/2021   CL 107 10/14/2021   CREATININE 1.03 10/14/2021   BUN 16 10/14/2021   CO2 28 10/14/2021   TSH 0.74 04/12/2021   INR 1.04 01/05/2017   HGBA1C 6.4 10/14/2021   MICROALBUR <0.7 04/12/2021         Assessment & Plan:   Physical exam: Screening blood work  ordered Exercise   Weight   Substance abuse  none   Reviewed recommended immunizations.   Health Maintenance  Topic Date Due   COVID-19 Vaccine (1) Never done   OPHTHALMOLOGY EXAM  Never done   PAP SMEAR-Modifier  Never done   MAMMOGRAM  04/12/2021   INFLUENZA VACCINE  01/28/2022   Diabetic kidney evaluation - Urine ACR  04/12/2022   HEMOGLOBIN A1C  04/15/2022   HIV Screening  12/05/2028 (Originally 10/16/1980)   Diabetic kidney evaluation - GFR measurement  10/15/2022   FOOT EXAM  03/26/2023   TETANUS/TDAP  11/29/2026   COLONOSCOPY (Pts 45-40yrs Insurance coverage will need to be confirmed)  03/16/2028   Hepatitis C Screening  Completed   Zoster Vaccines- Shingrix  Completed   HPV VACCINES  Aged Out           See Problem List for Assessment and Plan of chronic medical problems.

## 2022-04-16 ENCOUNTER — Encounter: Payer: Self-pay | Admitting: Internal Medicine

## 2022-04-16 ENCOUNTER — Ambulatory Visit (INDEPENDENT_AMBULATORY_CARE_PROVIDER_SITE_OTHER): Payer: 59 | Admitting: Internal Medicine

## 2022-04-16 VITALS — BP 120/76 | HR 81 | Temp 98.1°F | Ht 70.0 in | Wt 185.0 lb

## 2022-04-16 DIAGNOSIS — Z23 Encounter for immunization: Secondary | ICD-10-CM | POA: Diagnosis not present

## 2022-04-16 DIAGNOSIS — Z0000 Encounter for general adult medical examination without abnormal findings: Secondary | ICD-10-CM | POA: Diagnosis not present

## 2022-04-16 DIAGNOSIS — F445 Conversion disorder with seizures or convulsions: Secondary | ICD-10-CM

## 2022-04-16 DIAGNOSIS — J4520 Mild intermittent asthma, uncomplicated: Secondary | ICD-10-CM | POA: Diagnosis not present

## 2022-04-16 DIAGNOSIS — E1169 Type 2 diabetes mellitus with other specified complication: Secondary | ICD-10-CM | POA: Diagnosis not present

## 2022-04-16 DIAGNOSIS — E7800 Pure hypercholesterolemia, unspecified: Secondary | ICD-10-CM | POA: Diagnosis not present

## 2022-04-16 LAB — CBC WITH DIFFERENTIAL/PLATELET
Basophils Absolute: 0.1 10*3/uL (ref 0.0–0.1)
Basophils Relative: 1.1 % (ref 0.0–3.0)
Eosinophils Absolute: 0.1 10*3/uL (ref 0.0–0.7)
Eosinophils Relative: 1.8 % (ref 0.0–5.0)
HCT: 40.5 % (ref 36.0–46.0)
Hemoglobin: 13.4 g/dL (ref 12.0–15.0)
Lymphocytes Relative: 34.8 % (ref 12.0–46.0)
Lymphs Abs: 2.3 10*3/uL (ref 0.7–4.0)
MCHC: 33 g/dL (ref 30.0–36.0)
MCV: 87.6 fl (ref 78.0–100.0)
Monocytes Absolute: 0.4 10*3/uL (ref 0.1–1.0)
Monocytes Relative: 6.4 % (ref 3.0–12.0)
Neutro Abs: 3.7 10*3/uL (ref 1.4–7.7)
Neutrophils Relative %: 55.9 % (ref 43.0–77.0)
Platelets: 219 10*3/uL (ref 150.0–400.0)
RBC: 4.62 Mil/uL (ref 3.87–5.11)
RDW: 13.9 % (ref 11.5–15.5)
WBC: 6.6 10*3/uL (ref 4.0–10.5)

## 2022-04-16 LAB — LIPID PANEL
Cholesterol: 260 mg/dL — ABNORMAL HIGH (ref 0–200)
HDL: 95.2 mg/dL (ref >39.00)
LDL Cholesterol: 152 mg/dL — ABNORMAL HIGH (ref 0–99)
NonHDL: 164.93
Total CHOL/HDL Ratio: 3
Triglycerides: 64 mg/dL (ref 0.0–149.0)
VLDL: 12.8 mg/dL (ref 0.0–40.0)

## 2022-04-16 LAB — COMPREHENSIVE METABOLIC PANEL
ALT: 25 U/L (ref 0–35)
AST: 37 U/L (ref 0–37)
Albumin: 4.6 g/dL (ref 3.5–5.2)
Alkaline Phosphatase: 49 U/L (ref 39–117)
BUN: 17 mg/dL (ref 6–23)
CO2: 28 mEq/L (ref 19–32)
Calcium: 10.1 mg/dL (ref 8.4–10.5)
Chloride: 103 mEq/L (ref 96–112)
Creatinine, Ser: 1.03 mg/dL (ref 0.40–1.20)
GFR: 60.79 mL/min (ref >60.00)
Glucose, Bld: 97 mg/dL (ref 70–99)
Potassium: 4.4 mEq/L (ref 3.5–5.1)
Sodium: 138 mEq/L (ref 135–145)
Total Bilirubin: 0.6 mg/dL (ref 0.2–1.2)
Total Protein: 7.8 g/dL (ref 6.0–8.3)

## 2022-04-16 LAB — MICROALBUMIN / CREATININE URINE RATIO
Creatinine,U: 355.8 mg/dL
Microalb Creat Ratio: 0.4 mg/g (ref 0.0–30.0)
Microalb, Ur: 1.3 mg/dL (ref 0.0–1.9)

## 2022-04-16 LAB — HEMOGLOBIN A1C: Hgb A1c MFr Bld: 6.5 % (ref 4.6–6.5)

## 2022-04-16 LAB — TSH: TSH: 0.93 u[IU]/mL (ref 0.35–5.50)

## 2022-04-16 MED ORDER — ALBUTEROL SULFATE HFA 108 (90 BASE) MCG/ACT IN AERS: 2.0000 | INHALATION_SPRAY | Freq: Four times a day (QID) | RESPIRATORY_TRACT | 11 refills | Status: DC | PRN

## 2022-04-16 NOTE — Assessment & Plan Note (Signed)
H/o Has an occasional "little one" - typically related to stress Neuro w/u in the past neg

## 2022-04-16 NOTE — Addendum Note (Signed)
Addended by: Marcina Millard on: 04/16/2022 08:51 AM   Modules accepted: Orders

## 2022-04-16 NOTE — Assessment & Plan Note (Signed)
Chronic Mild intermittent Infrequent chest tightness Albuterol inhaler as needed

## 2022-04-16 NOTE — Assessment & Plan Note (Signed)
Chronic Controlled with diet Check A1c, urine microalbumin Continue diabetic diet and regular exercise

## 2022-04-16 NOTE — Assessment & Plan Note (Addendum)
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

## 2022-07-01 ENCOUNTER — Ambulatory Visit (INDEPENDENT_AMBULATORY_CARE_PROVIDER_SITE_OTHER): Payer: 59 | Admitting: Podiatry

## 2022-07-01 DIAGNOSIS — Z91199 Patient's noncompliance with other medical treatment and regimen due to unspecified reason: Secondary | ICD-10-CM

## 2022-07-01 NOTE — Progress Notes (Signed)
No show

## 2022-10-15 NOTE — Patient Instructions (Addendum)
      Blood work was ordered.   The lab is on the first floor.    Medications changes include :   none      Return in about 6 months (around 04/17/2023) for Physical Exam.  

## 2022-10-16 ENCOUNTER — Ambulatory Visit (INDEPENDENT_AMBULATORY_CARE_PROVIDER_SITE_OTHER): Payer: 59 | Admitting: Internal Medicine

## 2022-10-16 VITALS — BP 128/86 | HR 50 | Temp 98.6°F | Ht 70.0 in | Wt 188.4 lb

## 2022-10-16 DIAGNOSIS — E1169 Type 2 diabetes mellitus with other specified complication: Secondary | ICD-10-CM | POA: Diagnosis not present

## 2022-10-16 DIAGNOSIS — E7800 Pure hypercholesterolemia, unspecified: Secondary | ICD-10-CM

## 2022-10-16 DIAGNOSIS — J4520 Mild intermittent asthma, uncomplicated: Secondary | ICD-10-CM

## 2022-10-16 DIAGNOSIS — G63 Polyneuropathy in diseases classified elsewhere: Secondary | ICD-10-CM

## 2022-10-16 DIAGNOSIS — M5416 Radiculopathy, lumbar region: Secondary | ICD-10-CM | POA: Insufficient documentation

## 2022-10-16 LAB — LIPID PANEL
Cholesterol: 256 mg/dL — ABNORMAL HIGH (ref 0–200)
HDL: 92 mg/dL (ref >39.00)
LDL Cholesterol: 153 mg/dL — ABNORMAL HIGH (ref 0–99)
NonHDL: 163.69
Total CHOL/HDL Ratio: 3
Triglycerides: 54 mg/dL (ref 0.0–149.0)
VLDL: 10.8 mg/dL (ref 0.0–40.0)

## 2022-10-16 LAB — COMPREHENSIVE METABOLIC PANEL
ALT: 16 U/L (ref 0–35)
AST: 26 U/L (ref 0–37)
Albumin: 4.6 g/dL (ref 3.5–5.2)
Alkaline Phosphatase: 55 U/L (ref 39–117)
BUN: 17 mg/dL (ref 6–23)
CO2: 29 mEq/L (ref 19–32)
Calcium: 10 mg/dL (ref 8.4–10.5)
Chloride: 105 mEq/L (ref 96–112)
Creatinine, Ser: 1.02 mg/dL (ref 0.40–1.20)
GFR: 61.29 mL/min (ref >60.00)
Glucose, Bld: 96 mg/dL (ref 70–99)
Potassium: 4.3 mEq/L (ref 3.5–5.1)
Sodium: 141 mEq/L (ref 135–145)
Total Bilirubin: 0.5 mg/dL (ref 0.2–1.2)
Total Protein: 7.6 g/dL (ref 6.0–8.3)

## 2022-10-16 LAB — HEMOGLOBIN A1C: Hgb A1c MFr Bld: 6.2 % (ref 4.6–6.5)

## 2022-10-16 NOTE — Assessment & Plan Note (Signed)
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 

## 2022-10-16 NOTE — Progress Notes (Signed)
Subjective:    Patient ID: Alexis Hartman, female    DOB: 1965-10-24, 57 y.o.   MRN: [id number]     HPI Alexis Hartman is here for follow up of her chronic medical problems.  Doing well - no concerns.   She is having increased left leg pain, cramping.    Medications and allergies reviewed with patient and updated if appropriate.  Current Outpatient Medications on File Prior to Visit  Medication Sig Dispense Refill   acetaminophen (TYLENOL) 500 MG tablet Take 1,000 mg by mouth every 6 (six) hours as needed for moderate pain.     albuterol (VENTOLIN HFA) 108 (90 Base) MCG/ACT inhaler Inhale 2 puffs into the lungs every 6 (six) hours as needed for wheezing or shortness of breath. 8 g 11   Cholecalciferol (VITAMIN D) 2000 UNITS CAPS Take 1 capsule by mouth daily.     Multiple Vitamin (MULTIVITAMIN) tablet Take 1 tablet by mouth daily.     Omega-3 Fatty Acids (OMEGA 3 PO) Take by mouth daily.     Polyethylene Glycol 3350 (MIRALAX PO) Take by mouth as needed.     Red Yeast Rice Extract (RED YEAST RICE PO) Take by mouth.     No current facility-administered medications on file prior to visit.     Review of Systems  Constitutional:  Negative for fever.  HENT:  Positive for congestion (in morning).   Respiratory:  Positive for cough (in morning - clears out mucus) and wheezing (a little). Negative for shortness of breath.   Cardiovascular:  Negative for chest pain, palpitations and leg swelling.  Musculoskeletal:        Left leg pain and cramping  Neurological:  Negative for dizziness, light-headedness and headaches.       Objective:   Vitals:   10/16/22 0801  BP: 128/86  Pulse: (!) 50  Temp: 98.6 F (37 C)  SpO2: 96%   BP Readings from Last 3 Encounters:  10/16/22 128/86  04/16/22 120/76  10/14/21 126/82   Wt Readings from Last 3 Encounters:  10/16/22 188 lb 6.4 oz (85.5 kg)  04/16/22 185 lb (83.9 kg)  10/14/21 184 lb (83.5 kg)   Body mass index is 27.03  kg/m.    Physical Exam Constitutional:      General: She is not in acute distress.    Appearance: Normal appearance.  HENT:     Head: Normocephalic and atraumatic.  Eyes:     Conjunctiva/sclera: Conjunctivae normal.  Cardiovascular:     Rate and Rhythm: Normal rate and regular rhythm.     Heart sounds: Normal heart sounds.  Pulmonary:     Effort: Pulmonary effort is normal. No respiratory distress.     Breath sounds: Normal breath sounds. No wheezing.  Musculoskeletal:     Cervical back: Neck supple.     Right lower leg: No edema.     Left lower leg: No edema.  Lymphadenopathy:     Cervical: No cervical adenopathy.  Skin:    General: Skin is warm and dry.     Findings: No rash.  Neurological:     Mental Status: She is alert. Mental status is at baseline.  Psychiatric:        Mood and Affect: Mood normal.        Behavior: Behavior normal.        Lab Results  Component Value Date   WBC 6.6 04/16/2022   HGB 13.4 04/16/2022   HCT 40.5 04/16/2022  PLT 219.0 04/16/2022   GLUCOSE 97 04/16/2022   CHOL 260 (H) 04/16/2022   TRIG 64.0 04/16/2022   HDL 95.20 04/16/2022   LDLDIRECT 164.7 05/13/2013   LDLCALC 152 (H) 04/16/2022   ALT 25 04/16/2022   AST 37 04/16/2022   NA 138 04/16/2022   K 4.4 04/16/2022   CL 103 04/16/2022   CREATININE 1.03 04/16/2022   BUN 17 04/16/2022   CO2 28 04/16/2022   TSH 0.93 04/16/2022   INR 1.04 01/05/2017   HGBA1C 6.5 04/16/2022   MICROALBUR 1.3 04/16/2022     Assessment & Plan:    See Problem List for Assessment and Plan of chronic medical problems.

## 2022-10-16 NOTE — Assessment & Plan Note (Signed)
Chronic Mild intermittent Controlled  Infrequent chest tightness Continue Albuterol inhaler as needed

## 2022-10-16 NOTE — Assessment & Plan Note (Signed)
S/p surgery Has left leg pain, cramping Will f/u with Dr Yevette Edwards

## 2022-10-16 NOTE — Assessment & Plan Note (Signed)
Chronic Saw podiatry - diagnosed neuropathy Mild diminished sensation

## 2022-10-16 NOTE — Assessment & Plan Note (Signed)
Chronic Controlled with diet  Lab Results  Component Value Date   HGBA1C 6.5 04/16/2022   Check A1c Continue diabetic diet and regular exercise

## 2022-11-13 ENCOUNTER — Telehealth: Payer: 59 | Admitting: Physician Assistant

## 2022-11-13 DIAGNOSIS — J208 Acute bronchitis due to other specified organisms: Secondary | ICD-10-CM

## 2022-11-13 MED ORDER — BENZONATATE 100 MG PO CAPS
100.0000 mg | ORAL_CAPSULE | Freq: Three times a day (TID) | ORAL | 0 refills | Status: DC | PRN
Start: 2022-11-13 — End: 2023-04-20

## 2022-11-13 MED ORDER — ALBUTEROL SULFATE HFA 108 (90 BASE) MCG/ACT IN AERS
2.0000 | INHALATION_SPRAY | Freq: Four times a day (QID) | RESPIRATORY_TRACT | 11 refills | Status: DC | PRN
Start: 2022-11-13 — End: 2023-10-19

## 2022-11-13 MED ORDER — PREDNISONE 10 MG (21) PO TBPK
ORAL_TABLET | ORAL | 0 refills | Status: DC
Start: 2022-11-13 — End: 2023-04-20

## 2022-11-13 NOTE — Progress Notes (Signed)
E-Visit for Cough  We are sorry that you are not feeling well.  Here is how we plan to help!  Based on your presentation I believe you most likely have A cough due to a virus.  This is called viral bronchitis and is best treated by rest, plenty of fluids and control of the cough.  You may use Ibuprofen or Tylenol as directed to help your symptoms.     In addition you may use A non-prescription cough medication called Mucinex DM: take 2 tablets every 12 hours. and A prescription cough medication called Tessalon Perles 100mg . You may take 1-2 capsules every 8 hours as needed for your cough.  Prednisone 10 mg daily for 6 days (see taper instructions below)  Directions for 6 day taper: Day 1: 2 tablets before breakfast, 1 after both lunch & dinner and 2 at bedtime Day 2: 1 tab before breakfast, 1 after both lunch & dinner and 2 at bedtime Day 3: 1 tab at each meal & 1 at bedtime Day 4: 1 tab at breakfast, 1 at lunch, 1 at bedtime Day 5: 1 tab at breakfast & 1 tab at bedtime Day 6: 1 tab at breakfast  From your responses in the eVisit questionnaire you describe inflammation in the upper respiratory tract which is causing a significant cough.  This is commonly called Bronchitis and has four common causes:   Allergies Viral Infections Acid Reflux Bacterial Infection Allergies, viruses and acid reflux are treated by controlling symptoms or eliminating the cause. An example might be a cough caused by taking certain blood pressure medications. You stop the cough by changing the medication. Another example might be a cough caused by acid reflux. Controlling the reflux helps control the cough.  USE OF BRONCHODILATOR ("RESCUE") INHALERS: There is a risk from using your bronchodilator too frequently.  The risk is that over-reliance on a medication which only relaxes the muscles surrounding the breathing tubes can reduce the effectiveness of medications prescribed to reduce swelling and congestion of the  tubes themselves.  Although you feel brief relief from the bronchodilator inhaler, your asthma may actually be worsening with the tubes becoming more swollen and filled with mucus.  This can delay other crucial treatments, such as oral steroid medications. If you need to use a bronchodilator inhaler daily, several times per day, you should discuss this with your provider.  There are probably better treatments that could be used to keep your asthma under control.     HOME CARE Only take medications as instructed by your medical team. Complete the entire course of an antibiotic. Drink plenty of fluids and get plenty of rest. Avoid close contacts especially the very young and the elderly Cover your mouth if you cough or cough into your sleeve. Always remember to wash your hands A steam or ultrasonic humidifier can help congestion.   GET HELP RIGHT AWAY IF: You develop worsening fever. You become short of breath You cough up blood. Your symptoms persist after you have completed your treatment plan MAKE SURE YOU  Understand these instructions. Will watch your condition. Will get help right away if you are not doing well or get worse.    Thank you for choosing an e-visit.  Your e-visit answers were reviewed by a board certified advanced clinical practitioner to complete your personal care plan. Depending upon the condition, your plan could have included both over the counter or prescription medications.  Please review your pharmacy choice. Make sure the pharmacy is  open so you can pick up prescription now. If there is a problem, you may contact your provider through [employer] and have the prescription routed to another pharmacy.  Your safety is important to [place]. If you have drug allergies check your prescription carefully.   For the next 24 hours you can use MyChart to ask questions about today's visit, request a non-urgent call back, or ask for a work or school excuse. You will get an  email in the next two days asking about your experience. I hope that your e-visit has been valuable and will speed your recovery.  I have spent 5 minutes in review of e-visit questionnaire, review and updating patient chart, medical decision making and response to patient.   Margaretann Loveless, PA-C

## 2023-03-16 ENCOUNTER — Other Ambulatory Visit: Payer: Self-pay | Admitting: Orthopedic Surgery

## 2023-04-20 ENCOUNTER — Encounter: Payer: Self-pay | Admitting: Internal Medicine

## 2023-04-20 NOTE — Progress Notes (Unsigned)
Subjective:    Patient ID: Alexis Hartman, female    DOB: 06-13-1966, 57 y.o.   MRN: [id number]      HPI Alexis Hartman is here for a Physical exam and her chronic medical problems.   Had viral bronchitis - still recovering.   Chest wall tenderness - ? Flare of costochondritis.    Having surgery on 11/7 for her back.  Does not have any back pain, but is having pain in the left leg.  Pain in left arm - combination of pain and N/T.  Will wake up at night and can't not move it.  No neck pain.     Medications and allergies reviewed with patient and updated if appropriate.  Current Outpatient Medications on File Prior to Visit  Medication Sig Dispense Refill   acetaminophen (TYLENOL) 500 MG tablet Take 1,000 mg by mouth every 6 (six) hours as needed for moderate pain.     albuterol (VENTOLIN HFA) 108 (90 Base) MCG/ACT inhaler Inhale 2 puffs into the lungs every 6 (six) hours as needed for wheezing or shortness of breath. 8 g 11   Cholecalciferol (VITAMIN D) 2000 UNITS CAPS Take 1 capsule by mouth daily.     meloxicam (MOBIC) 15 MG tablet Take 15 mg by mouth daily.     methocarbamol (ROBAXIN) 500 MG tablet Take 500-1,000 mg by mouth every 6 (six) hours as needed.     Multiple Vitamin (MULTIVITAMIN) tablet Take 1 tablet by mouth daily.     Omega-3 Fatty Acids (OMEGA 3 PO) Take by mouth daily.     Polyethylene Glycol 3350 (MIRALAX PO) Take by mouth as needed.     Red Yeast Rice Extract (RED YEAST RICE PO) Take by mouth.     No current facility-administered medications on file prior to visit.    Review of Systems  Constitutional:  Negative for fever.  HENT:  Positive for congestion (at night). Negative for sinus pain.   Eyes:  Negative for visual disturbance.  Respiratory:  Positive for cough (sounds wet - does not bring any up - residual from viral bronchitis) and wheezing (occ). Negative for shortness of breath.   Cardiovascular:  Positive for chest pain. Negative for palpitations  and leg swelling.  Gastrointestinal:  Negative for abdominal pain, blood in stool, constipation and diarrhea.       Mild gerd at times  Genitourinary:  Negative for dysuria.  Musculoskeletal:  Positive for arthralgias (right shoulder). Negative for back pain (left leg pain - radiculopathy - for surgery next month).  Skin:  Negative for rash.  Neurological:  Positive for headaches (wakes up frequently with headaches). Negative for syncope and light-headedness.       Balance off for a few days last week  Psychiatric/Behavioral:  Negative for dysphoric mood. The patient is not nervous/anxious.        Objective:   Vitals:   04/21/23 0812  BP: 126/80  Pulse: 75  Temp: 98 F (36.7 C)  SpO2: 98%   Filed Weights   04/21/23 0812  Weight: 196 lb (88.9 kg)   Body mass index is 28.12 kg/m.  BP Readings from Last 3 Encounters:  04/21/23 126/80  10/16/22 128/86  04/16/22 120/76    Wt Readings from Last 3 Encounters:  04/21/23 196 lb (88.9 kg)  10/16/22 188 lb 6.4 oz (85.5 kg)  04/16/22 185 lb (83.9 kg)       Physical Exam Constitutional: She appears well-developed and well-nourished. No distress.  HENT:  Head: Normocephalic and atraumatic.  Right Ear: External ear normal. Normal ear canal and TM Left Ear: External ear normal.  Normal ear canal and TM Mouth/Throat: Oropharynx is clear and moist.  Eyes: Conjunctivae normal.  Neck: Neck supple. No tracheal deviation present. No thyromegaly present.  No carotid bruit  Cardiovascular: Normal rate, regular rhythm and normal heart sounds.   No murmur heard.  No edema. Pulmonary/Chest: Effort normal and breath sounds normal. No respiratory distress. She has no wheezes. She has no rales.  Breast: deferred   Abdominal: Soft. She exhibits no distension. There is no tenderness.  Lymphadenopathy: She has no cervical adenopathy.  Skin: Skin is warm and dry. She is not diaphoretic.  Psychiatric: She has a normal mood and affect. Her  behavior is normal.     Lab Results  Component Value Date   WBC 6.6 04/16/2022   HGB 13.4 04/16/2022   HCT 40.5 04/16/2022   PLT 219.0 04/16/2022   GLUCOSE 96 10/16/2022   CHOL 256 (H) 10/16/2022   TRIG 54.0 10/16/2022   HDL 92.00 10/16/2022   LDLDIRECT 164.7 05/13/2013   LDLCALC 153 (H) 10/16/2022   ALT 16 10/16/2022   AST 26 10/16/2022   NA 141 10/16/2022   K 4.3 10/16/2022   CL 105 10/16/2022   CREATININE 1.02 10/16/2022   BUN 17 10/16/2022   CO2 29 10/16/2022   TSH 0.93 04/16/2022   INR 1.04 01/05/2017   HGBA1C 6.2 10/16/2022   MICROALBUR 1.3 04/16/2022         Assessment & Plan:   Physical exam: Screening blood work  ordered Exercise  regular Weight  overweight - will work on weight loss Substance abuse  none   Reviewed recommended immunizations.  Flu immunization administered today.     Health Maintenance  Topic Date Due   OPHTHALMOLOGY EXAM  Never done   Cervical Cancer Screening (HPV/Pap Cotest)  Never done   MAMMOGRAM  04/12/2021   INFLUENZA VACCINE  01/29/2023   COVID-19 Vaccine (1 - 2023-24 season) Never done   Diabetic kidney evaluation - Urine ACR  04/17/2023   FOOT EXAM  03/26/2023   HEMOGLOBIN A1C  04/17/2023   HIV Screening  12/05/2028 (Originally 10/16/1980)   Diabetic kidney evaluation - eGFR measurement  10/16/2023   DTaP/Tdap/Td (2 - Td or Tdap) 11/29/2026   Colonoscopy  03/16/2028   Hepatitis C Screening  Completed   Zoster Vaccines- Shingrix  Completed   HPV VACCINES  Aged Out          See Problem List for Assessment and Plan of chronic medical problems.

## 2023-04-20 NOTE — Patient Instructions (Addendum)
Flu immunization administered today.     Blood work was ordered.   The lab is on the first floor.    Medications changes include :   steroid cream for your hands - use as needed    [facility] [address] Phone : [phone]     Return in about 6 months (around 10/20/2023) for follow up.   Health Maintenance, Female Adopting a healthy lifestyle and getting preventive care are important in promoting health and wellness. Ask your health care provider about: The right schedule for you to have regular tests and exams. Things you can do on your own to prevent diseases and keep yourself healthy. What should I know about diet, weight, and exercise? Eat a healthy diet  Eat a diet that includes plenty of vegetables, fruits, low-fat dairy products, and lean protein. Do not eat a lot of foods that are high in solid fats, added sugars, or sodium. Maintain a healthy weight Body mass index (BMI) is used to identify weight problems. It estimates body fat based on height and weight. Your health care provider can help determine your BMI and help you achieve or maintain a healthy weight. Get regular exercise Get regular exercise. This is one of the most important things you can do for your health. Most adults should: Exercise for at least 150 minutes each week. The exercise should increase your heart rate and make you sweat (moderate-intensity exercise). Do strengthening exercises at least twice a week. This is in addition to the moderate-intensity exercise. Spend less time sitting. Even light physical activity can be beneficial. Watch cholesterol and blood lipids Have your blood tested for lipids and cholesterol at 57 years of age, then have this test every 5 years. Have your cholesterol levels checked more often if: Your lipid or cholesterol levels are high. You are older than 57 years of age. You are at high risk for heart disease. What should I  know about cancer screening? Depending on your health history and family history, you may need to have cancer screening at various ages. This may include screening for: Breast cancer. Cervical cancer. Colorectal cancer. Skin cancer. Lung cancer. What should I know about heart disease, diabetes, and high blood pressure? Blood pressure and heart disease High blood pressure causes heart disease and increases the risk of stroke. This is more likely to develop in people who have high blood pressure readings or are overweight. Have your blood pressure checked: Every 3-5 years if you are 89-49 years of age. Every year if you are 53 years old or older. Diabetes Have regular diabetes screenings. This checks your fasting blood sugar level. Have the screening done: Once every three years after age 31 if you are at a normal weight and have a low risk for diabetes. More often and at a younger age if you are overweight or have a high risk for diabetes. What should I know about preventing infection? Hepatitis B If you have a higher risk for hepatitis B, you should be screened for this virus. Talk with your health care provider to find out if you are at risk for hepatitis B infection. Hepatitis C Testing is recommended for: Everyone born from [birth year] through 1965. Anyone with known risk factors for hepatitis C. Sexually transmitted infections (STIs) Get screened for STIs, including gonorrhea and chlamydia, if: You are sexually active and are younger than 57 years of age. You are older than 57 years of age and your  health care provider tells you that you are at risk for this type of infection. Your sexual activity has changed since you were last screened, and you are at increased risk for chlamydia or gonorrhea. Ask your health care provider if you are at risk. Ask your health care provider about whether you are at high risk for HIV. Your health care provider may recommend a prescription medicine to help  prevent HIV infection. If you choose to take medicine to prevent HIV, you should first get tested for HIV. You should then be tested every 3 months for as long as you are taking the medicine. Pregnancy If you are about to stop having your period (premenopausal) and you may become pregnant, seek counseling before you get pregnant. Take 400 to 800 micrograms (mcg) of folic acid every day if you become pregnant. Ask for birth control (contraception) if you want to prevent pregnancy. Osteoporosis and menopause Osteoporosis is a disease in which the bones lose minerals and strength with aging. This can result in bone fractures. If you are 20 years old or older, or if you are at risk for osteoporosis and fractures, ask your health care provider if you should: Be screened for bone loss. Take a calcium or vitamin D supplement to lower your risk of fractures. Be given hormone replacement therapy (HRT) to treat symptoms of menopause. Follow these instructions at home: Alcohol use Do not drink alcohol if: Your health care provider tells you not to drink. You are pregnant, may be pregnant, or are planning to become pregnant. If you drink alcohol: Limit how much you have to: 0-1 drink a day. Know how much alcohol is in your drink. In the [place] one drink equals one 12 oz bottle of beer (355 mL), one 5 oz glass of wine (148 mL), or one 1 oz glass of hard liquor (44 mL). Lifestyle Do not use any products that contain nicotine or tobacco. These products include cigarettes, chewing tobacco, and vaping devices, such as e-cigarettes. If you need help quitting, ask your health care provider. Do not use street drugs. Do not share needles. Ask your health care provider for help if you need support or information about quitting drugs. General instructions Schedule regular health, dental, and eye exams. Stay current with your vaccines. Tell your health care provider if: You often feel depressed. You have ever  been abused or do not feel safe at home. Summary Adopting a healthy lifestyle and getting preventive care are important in promoting health and wellness. Follow your health care provider's instructions about healthy diet, exercising, and getting tested or screened for diseases. Follow your health care provider's instructions on monitoring your cholesterol and blood pressure. This information is not intended to replace advice given to you by your health care provider. Make sure you discuss any questions you have with your health care provider. Document Revised: 11/05/2020 Document Reviewed: 11/05/2020 Elsevier Patient Education  2024 [employer].

## 2023-04-21 ENCOUNTER — Ambulatory Visit: Payer: 59 | Admitting: Internal Medicine

## 2023-04-21 VITALS — BP 126/80 | HR 75 | Temp 98.0°F | Ht 70.0 in | Wt 196.0 lb

## 2023-04-21 DIAGNOSIS — Z0000 Encounter for general adult medical examination without abnormal findings: Secondary | ICD-10-CM | POA: Diagnosis not present

## 2023-04-21 DIAGNOSIS — M791 Myalgia, unspecified site: Secondary | ICD-10-CM

## 2023-04-21 DIAGNOSIS — Z23 Encounter for immunization: Secondary | ICD-10-CM | POA: Diagnosis not present

## 2023-04-21 DIAGNOSIS — E7800 Pure hypercholesterolemia, unspecified: Secondary | ICD-10-CM | POA: Diagnosis not present

## 2023-04-21 DIAGNOSIS — T466X5A Adverse effect of antihyperlipidemic and antiarteriosclerotic drugs, initial encounter: Secondary | ICD-10-CM

## 2023-04-21 DIAGNOSIS — E1169 Type 2 diabetes mellitus with other specified complication: Secondary | ICD-10-CM

## 2023-04-21 DIAGNOSIS — M5416 Radiculopathy, lumbar region: Secondary | ICD-10-CM

## 2023-04-21 DIAGNOSIS — J4520 Mild intermittent asthma, uncomplicated: Secondary | ICD-10-CM

## 2023-04-21 LAB — CBC WITH DIFFERENTIAL/PLATELET
Basophils Absolute: 0.1 10*3/uL (ref 0.0–0.1)
Basophils Relative: 1.2 % (ref 0.0–3.0)
Eosinophils Absolute: 0.1 10*3/uL (ref 0.0–0.7)
Eosinophils Relative: 2.5 % (ref 0.0–5.0)
HCT: 38.7 % (ref 36.0–46.0)
Hemoglobin: 12.4 g/dL (ref 12.0–15.0)
Lymphocytes Relative: 53.9 % — ABNORMAL HIGH (ref 12.0–46.0)
Lymphs Abs: 3.1 10*3/uL (ref 0.7–4.0)
MCHC: 32 g/dL (ref 30.0–36.0)
MCV: 87.8 fL (ref 78.0–100.0)
Monocytes Absolute: 0.4 10*3/uL (ref 0.1–1.0)
Monocytes Relative: 6.3 % (ref 3.0–12.0)
Neutro Abs: 2.1 10*3/uL (ref 1.4–7.7)
Neutrophils Relative %: 36.1 % — ABNORMAL LOW (ref 43.0–77.0)
Platelets: 238 10*3/uL (ref 150.0–400.0)
RBC: 4.4 Mil/uL (ref 3.87–5.11)
RDW: 13.9 % (ref 11.5–15.5)
WBC: 5.8 10*3/uL (ref 4.0–10.5)

## 2023-04-21 LAB — COMPREHENSIVE METABOLIC PANEL
ALT: 18 U/L (ref 0–35)
AST: 28 U/L (ref 0–37)
Albumin: 4.5 g/dL (ref 3.5–5.2)
Alkaline Phosphatase: 50 U/L (ref 39–117)
BUN: 18 mg/dL (ref 6–23)
CO2: 27 meq/L (ref 19–32)
Calcium: 9.8 mg/dL (ref 8.4–10.5)
Chloride: 105 meq/L (ref 96–112)
Creatinine, Ser: 0.96 mg/dL (ref 0.40–1.20)
GFR: 65.68 mL/min (ref >60.00)
Glucose, Bld: 97 mg/dL (ref 70–99)
Potassium: 4.2 meq/L (ref 3.5–5.1)
Sodium: 142 meq/L (ref 135–145)
Total Bilirubin: 0.4 mg/dL (ref 0.2–1.2)
Total Protein: 7.6 g/dL (ref 6.0–8.3)

## 2023-04-21 LAB — HEMOGLOBIN A1C: Hgb A1c MFr Bld: 6.2 % (ref 4.6–6.5)

## 2023-04-21 LAB — LIPID PANEL
Cholesterol: 234 mg/dL — ABNORMAL HIGH (ref 0–200)
HDL: 84.7 mg/dL (ref >39.00)
LDL Cholesterol: 136 mg/dL — ABNORMAL HIGH (ref 0–99)
NonHDL: 148.89
Total CHOL/HDL Ratio: 3
Triglycerides: 62 mg/dL (ref 0.0–149.0)
VLDL: 12.4 mg/dL (ref 0.0–40.0)

## 2023-04-21 LAB — MICROALBUMIN / CREATININE URINE RATIO
Creatinine,U: 130.1 mg/dL
Microalb Creat Ratio: 0.5 mg/g (ref 0.0–30.0)
Microalb, Ur: <0.7 mg/dL (ref 0.0–1.9)

## 2023-04-21 LAB — TSH: TSH: 1.32 u[IU]/mL (ref 0.35–5.50)

## 2023-04-21 MED ORDER — CLOBETASOL PROPIONATE 0.05 % EX CREA: 1.0000 | TOPICAL_CREAM | Freq: Two times a day (BID) | CUTANEOUS | 0 refills | Status: DC

## 2023-04-21 NOTE — Assessment & Plan Note (Signed)
Chronic No back pain Left leg pain Having surgery November 2024

## 2023-04-21 NOTE — Assessment & Plan Note (Signed)
Chronic Mild intermittent Controlled  Continue Albuterol inhaler as needed

## 2023-04-21 NOTE — Assessment & Plan Note (Addendum)
Chronic Controlled with diet  Lab Results  Component Value Date   HGBA1C 6.2 10/16/2022   Check A1c, urine microalbumin, CMP, CBC Continue diabetic diet and regular exercise

## 2023-04-21 NOTE — Assessment & Plan Note (Signed)
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

## 2023-04-21 NOTE — Assessment & Plan Note (Signed)
Chronic Intolerant of statin secondary to myalgia

## 2023-04-21 NOTE — Addendum Note (Signed)
Addended by: Karma Ganja on: 04/21/2023 01:01 PM   Modules accepted: Orders

## 2023-04-24 LAB — HM MAMMOGRAPHY

## 2023-04-29 NOTE — Pre-Procedure Instructions (Signed)
Surgical Instructions   Your procedure is scheduled on Wednesday, November 6th. Report to [facility] Main Entrance "A" at 05:30 A.M., then check in with the Admitting office. Any questions or running late day of surgery: call [phone]  Questions prior to your surgery date: call [phone], Monday-Friday, 8am-4pm. If you experience any cold or flu symptoms such as cough, fever, chills, shortness of breath, etc. between now and your scheduled surgery, please notify us at the above number.     Remember:  Do not eat after midnight the night before your surgery  You may drink clear liquids until 05:30 AM the morning of your surgery.   Clear liquids allowed are: Water, Non-Citrus Juices (without pulp), Carbonated Beverages, Clear Tea, Black Coffee Only (NO MILK, CREAM OR POWDERED CREAMER of any kind), and Gatorade.    Take these medicines the morning of surgery with A SIP OF WATER  May take these medicines IF NEEDED: methocarbamol (ROBAXIN)    One week prior to surgery, STOP taking any Aspirin (unless otherwise instructed by your surgeon) Aleve, Naproxen, Ibuprofen, Motrin, Advil, Goody's, BC's, meloxicam (MOBIC), all herbal medications, fish oil, and non-prescription vitamins.                     Do NOT Smoke (Tobacco/Vaping) for 24 hours prior to your procedure.  If you use a CPAP at night, you may bring your mask/headgear for your overnight stay.   You will be asked to remove any contacts, glasses, piercing's, hearing aid's, dentures/partials prior to surgery. Please bring cases for these items if needed.    Patients discharged the day of surgery will not be allowed to drive home, and someone needs to stay with them for 24 hours.  SURGICAL WAITING ROOM VISITATION Patients may have no more than 2 support people in the waiting area - these visitors may rotate.   Pre-op nurse will coordinate an appropriate time for 1 ADULT support person, who may not rotate, to accompany patient  in pre-op.  Children under the age of 15 must have an adult with them who is not the patient and must remain in the main waiting area with an adult.  If the patient needs to stay at the hospital during part of their recovery, the visitor guidelines for inpatient rooms apply.  Please refer to the [facility] website for the visitor guidelines for any additional information.   If you received a COVID test during your pre-op visit  it is requested that you wear a mask when out in public, stay away from anyone that may not be feeling well and notify your surgeon if you develop symptoms. If you have been in contact with anyone that has tested positive in the last 10 days please notify you surgeon.      Pre-operative 5 CHG Bathing Instructions   You can play a key role in reducing the risk of infection after surgery. Your skin needs to be as free of germs as possible. You can reduce the number of germs on your skin by washing with CHG (chlorhexidine gluconate) soap before surgery. CHG is an antiseptic soap that kills germs and continues to kill germs even after washing.   DO NOT use if you have an allergy to chlorhexidine/CHG or antibacterial soaps. If your skin becomes reddened or irritated, stop using the CHG and notify one of our RNs at [phone].   Please shower with the CHG soap starting 4 days before surgery using the following schedule:  Please keep in mind the following:  DO NOT shave, including legs and underarms, starting the day of your first shower.   You may shave your face at any point before/day of surgery.  Place clean sheets on your bed the day you start using CHG soap. Use a clean washcloth (not used since being washed) for each shower. DO NOT sleep with pets once you start using the CHG.   CHG Shower Instructions:  Wash your face and private area with normal soap. If you choose to wash your hair, wash first with your normal shampoo.  After you use shampoo/soap,  rinse your hair and body thoroughly to remove shampoo/soap residue.  Turn the water OFF and apply about 3 tablespoons (45 ml) of CHG soap to a CLEAN washcloth.  Apply CHG soap ONLY FROM YOUR NECK DOWN TO YOUR TOES (washing for 3-5 minutes)  DO NOT use CHG soap on face, private areas, open wounds, or sores.  Pay special attention to the area where your surgery is being performed.  If you are having back surgery, having someone wash your back for you may be helpful. Wait 2 minutes after CHG soap is applied, then you may rinse off the CHG soap.  Pat dry with a clean towel  Put on clean clothes/pajamas   If you choose to wear lotion, please use ONLY the CHG-compatible lotions on the back of this paper.   Additional instructions for the day of surgery: DO NOT APPLY any lotions, deodorants, cologne, or perfumes.   Do not bring valuables to the hospital. [facility] is not responsible for any belongings/valuables. Do not wear nail polish, gel polish, artificial nails, or any other type of covering on natural nails (fingers and toes) Do not wear jewelry or makeup Put on clean/comfortable clothes.  Please brush your teeth.  Ask your nurse before applying any prescription medications to the skin.     CHG Compatible Lotions   Aveeno Moisturizing lotion  Cetaphil Moisturizing Cream  Cetaphil Moisturizing Lotion  Clairol Herbal Essence Moisturizing Lotion, Dry Skin  Clairol Herbal Essence Moisturizing Lotion, Extra Dry Skin  Clairol Herbal Essence Moisturizing Lotion, Normal Skin  Curel Age Defying Therapeutic Moisturizing Lotion with Alpha Hydroxy  Curel Extreme Care Body Lotion  Curel Soothing Hands Moisturizing Hand Lotion  Curel Therapeutic Moisturizing Cream, Fragrance-Free  Curel Therapeutic Moisturizing Lotion, Fragrance-Free  Curel Therapeutic Moisturizing Lotion, Original Formula  Eucerin Daily Replenishing Lotion  Eucerin Dry Skin Therapy Plus Alpha Hydroxy Crme  Eucerin Dry Skin  Therapy Plus Alpha Hydroxy Lotion  Eucerin Original Crme  Eucerin Original Lotion  Eucerin Plus Crme Eucerin Plus Lotion  Eucerin TriLipid Replenishing Lotion  Keri Anti-Bacterial Hand Lotion  Keri Deep Conditioning Original Lotion Dry Skin Formula Softly Scented  Keri Deep Conditioning Original Lotion, Fragrance Free Sensitive Skin Formula  Keri Lotion Fast Absorbing Fragrance Free Sensitive Skin Formula  Keri Lotion Fast Absorbing Softly Scented Dry Skin Formula  Keri Original Lotion  Keri Skin Renewal Lotion Keri Silky Smooth Lotion  Keri Silky Smooth Sensitive Skin Lotion  Nivea Body Creamy Conditioning Oil  Nivea Body Extra Enriched Lotion  Nivea Body Original Lotion  Nivea Body Sheer Moisturizing Lotion Nivea Crme  Nivea Skin Firming Lotion  NutraDerm 30 Skin Lotion  NutraDerm Skin Lotion  NutraDerm Therapeutic Skin Cream  NutraDerm Therapeutic Skin Lotion  ProShield Protective Hand Cream  Provon moisturizing lotion  Please read over the following fact sheets that you were given.

## 2023-04-30 ENCOUNTER — Other Ambulatory Visit: Payer: Self-pay

## 2023-04-30 ENCOUNTER — Encounter (HOSPITAL_COMMUNITY)
Admission: RE | Admit: 2023-04-30 | Discharge: 2023-04-30 | Disposition: A | Discharge status: Home / Self Care | Payer: 59 | Source: Ambulatory Visit | Attending: Orthopedic Surgery | Admitting: Orthopedic Surgery

## 2023-04-30 ENCOUNTER — Encounter (HOSPITAL_COMMUNITY): Payer: Self-pay

## 2023-04-30 VITALS — BP 162/83 | HR 54 | Temp 98.4°F | Resp 18 | Ht 70.0 in | Wt 194.5 lb

## 2023-04-30 DIAGNOSIS — M5416 Radiculopathy, lumbar region: Secondary | ICD-10-CM | POA: Insufficient documentation

## 2023-04-30 DIAGNOSIS — Z01818 Encounter for other preprocedural examination: Secondary | ICD-10-CM | POA: Diagnosis present

## 2023-04-30 DIAGNOSIS — R001 Bradycardia, unspecified: Secondary | ICD-10-CM | POA: Insufficient documentation

## 2023-04-30 HISTORY — DX: Prediabetes: R73.03

## 2023-04-30 HISTORY — DX: Anemia, unspecified: D64.9

## 2023-04-30 LAB — BASIC METABOLIC PANEL
Anion gap: 6 (ref 5–15)
BUN: 17 mg/dL (ref 6–20)
CO2: 25 mmol/L (ref 22–32)
Calcium: 9.8 mg/dL (ref 8.9–10.3)
Chloride: 108 mmol/L (ref 98–111)
Creatinine, Ser: 0.94 mg/dL (ref 0.44–1.00)
GFR, Estimated: >60 mL/min (ref >60)
Glucose, Bld: 94 mg/dL (ref 70–99)
Potassium: 4.3 mmol/L (ref 3.5–5.1)
Sodium: 139 mmol/L (ref 135–145)

## 2023-04-30 LAB — TYPE AND SCREEN
ABO/RH(D): A POS
Antibody Screen: NEGATIVE

## 2023-04-30 LAB — SURGICAL PCR SCREEN
MRSA, PCR: NEGATIVE
Staphylococcus aureus: POSITIVE — AB

## 2023-04-30 NOTE — Progress Notes (Addendum)
PCP - Dr. Cheryll Cockayne   Cardiologist - no  EP-no  Endocrine-no  Pulm-no  Chest x-ray - na  EKG - 04/30/23  Stress Test - no  ECHO - no  Cardiac Cath - no  AICD-no PM-no LOOP-no  Nerve Stimulator-yes Patient instructed to bring the control with her.  Dialysis-no  Sleep Study - no CPAP - no  LABS-T/S ,BMP, PC.  CBC drawn 04/21/23  ASA-na  ERAS-yes  HA1C-04/21/23- 6.2 GLP-1-no Fasting Blood Sugar - no Checks Blood Sugar ___no__ times a day  Anesthesia-  Pt denies having chest pain, sob, or fever at this time. All instructions explained to the pt, with a verbal understanding of the material. Pt agrees to go over the instructions while at home for a better understanding. The opportunity to ask questions was provided.

## 2023-05-06 ENCOUNTER — Encounter (HOSPITAL_COMMUNITY): Payer: Self-pay | Admitting: Orthopedic Surgery

## 2023-05-06 ENCOUNTER — Ambulatory Visit (HOSPITAL_COMMUNITY)
Admission: RE | Disposition: A | Discharge status: Home Health | Payer: Self-pay | Source: Home / Self Care | Attending: Orthopedic Surgery

## 2023-05-06 ENCOUNTER — Other Ambulatory Visit: Payer: Self-pay

## 2023-05-06 ENCOUNTER — Ambulatory Visit (HOSPITAL_COMMUNITY): Payer: 59 | Admitting: Anesthesiology

## 2023-05-06 ENCOUNTER — Ambulatory Visit (HOSPITAL_COMMUNITY): Payer: 59

## 2023-05-06 ENCOUNTER — Ambulatory Visit (HOSPITAL_BASED_OUTPATIENT_CLINIC_OR_DEPARTMENT_OTHER): Payer: 59 | Admitting: Anesthesiology

## 2023-05-06 ENCOUNTER — Observation Stay (HOSPITAL_COMMUNITY)
Admission: RE | Admit: 2023-05-06 | Discharge: 2023-05-07 | Disposition: A | Discharge status: Home Health | Payer: 59 | Attending: Orthopedic Surgery | Admitting: Orthopedic Surgery

## 2023-05-06 DIAGNOSIS — M5117 Intervertebral disc disorders with radiculopathy, lumbosacral region: Secondary | ICD-10-CM | POA: Diagnosis present

## 2023-05-06 DIAGNOSIS — M5416 Radiculopathy, lumbar region: Secondary | ICD-10-CM

## 2023-05-06 DIAGNOSIS — R7303 Prediabetes: Secondary | ICD-10-CM | POA: Diagnosis not present

## 2023-05-06 DIAGNOSIS — J45909 Unspecified asthma, uncomplicated: Secondary | ICD-10-CM | POA: Insufficient documentation

## 2023-05-06 DIAGNOSIS — Z01818 Encounter for other preprocedural examination: Secondary | ICD-10-CM

## 2023-05-06 HISTORY — PX: TRANSFORAMINAL LUMBAR INTERBODY FUSION (TLIF) WITH PEDICLE SCREW FIXATION 1 LEVEL: SHX6141

## 2023-05-06 LAB — GLUCOSE, CAPILLARY
Glucose-Capillary: 92 mg/dL (ref 70–99)
Glucose-Capillary: 112 mg/dL — ABNORMAL HIGH (ref 70–99)

## 2023-05-06 SURGERY — TRANSFORAMINAL LUMBAR INTERBODY FUSION (TLIF) WITH PEDICLE SCREW FIXATION 1 LEVEL
Anesthesia: General | Site: Spine Lumbar | Laterality: Left

## 2023-05-06 MED ORDER — ONDANSETRON HCL 4 MG/2ML IJ SOLN: 4.0000 mg | Freq: Four times a day (QID) | INTRAMUSCULAR | Status: DC | PRN

## 2023-05-06 MED ORDER — THROMBIN 20000 UNITS EX SOLR: OROMUCOSAL | Status: DC | PRN

## 2023-05-06 MED ORDER — DEXAMETHASONE SODIUM PHOSPHATE 10 MG/ML IJ SOLN
INTRAMUSCULAR | Status: AC
  Filled 2023-05-06: qty 1

## 2023-05-06 MED ORDER — EPHEDRINE 5 MG/ML INJ
INTRAVENOUS | Status: AC
  Filled 2023-05-06: qty 5

## 2023-05-06 MED ORDER — GLYCOPYRROLATE PF 0.2 MG/ML IJ SOSY
PREFILLED_SYRINGE | INTRAMUSCULAR | Status: AC
  Filled 2023-05-06: qty 1

## 2023-05-06 MED ORDER — BUPIVACAINE-EPINEPHRINE (PF) 0.25% -1:200000 IJ SOLN
INTRAMUSCULAR | Status: AC
  Filled 2023-05-06: qty 30

## 2023-05-06 MED ORDER — ONDANSETRON HCL 4 MG PO TABS
4.0000 mg | ORAL_TABLET | Freq: Four times a day (QID) | ORAL | Status: DC | PRN
Start: 2023-05-06 — End: 2023-05-07

## 2023-05-06 MED ORDER — GLYCOPYRROLATE PF 0.2 MG/ML IJ SOSY
PREFILLED_SYRINGE | INTRAMUSCULAR | Status: DC | PRN
  Administered 2023-05-06: .2 mg via INTRAVENOUS

## 2023-05-06 MED ORDER — BISACODYL 5 MG PO TBEC: 5.0000 mg | DELAYED_RELEASE_TABLET | Freq: Every day | ORAL | Status: DC | PRN

## 2023-05-06 MED ORDER — LIDOCAINE 2% (20 MG/ML) 5 ML SYRINGE
INTRAMUSCULAR | Status: DC | PRN
  Administered 2023-05-06: 60 mg via INTRAVENOUS

## 2023-05-06 MED ORDER — ROCURONIUM BROMIDE 10 MG/ML (PF) SYRINGE
PREFILLED_SYRINGE | INTRAVENOUS | Status: AC
  Filled 2023-05-06: qty 10

## 2023-05-06 MED ORDER — PROPOFOL 10 MG/ML IV BOLUS
INTRAVENOUS | Status: DC | PRN
  Administered 2023-05-06: 200 mg via INTRAVENOUS

## 2023-05-06 MED ORDER — ALBUTEROL SULFATE HFA 108 (90 BASE) MCG/ACT IN AERS: 2.0000 | INHALATION_SPRAY | Freq: Four times a day (QID) | RESPIRATORY_TRACT | Status: DC | PRN

## 2023-05-06 MED ORDER — OXYCODONE HCL 5 MG/5ML PO SOLN: 5.0000 mg | Freq: Once | ORAL | Status: DC | PRN

## 2023-05-06 MED ORDER — ONDANSETRON HCL 4 MG/2ML IJ SOLN
INTRAMUSCULAR | Status: AC
  Filled 2023-05-06: qty 2

## 2023-05-06 MED ORDER — PHENYLEPHRINE 80 MCG/ML (10ML) SYRINGE FOR IV PUSH (FOR BLOOD PRESSURE SUPPORT)
PREFILLED_SYRINGE | INTRAVENOUS | Status: AC
  Filled 2023-05-06: qty 10

## 2023-05-06 MED ORDER — LIDOCAINE 2% (20 MG/ML) 5 ML SYRINGE
INTRAMUSCULAR | Status: AC
  Filled 2023-05-06: qty 5

## 2023-05-06 MED ORDER — EPHEDRINE SULFATE-NACL 50-0.9 MG/10ML-% IV SOSY
PREFILLED_SYRINGE | INTRAVENOUS | Status: DC | PRN
  Administered 2023-05-06 (×4): 5 mg via INTRAVENOUS

## 2023-05-06 MED ORDER — PHENOL 1.4 % MT LIQD: 1.0000 | OROMUCOSAL | Status: DC | PRN

## 2023-05-06 MED ORDER — CEFAZOLIN SODIUM-DEXTROSE 2-4 GM/100ML-% IV SOLN
2.0000 g | INTRAVENOUS | Status: AC
  Administered 2023-05-06: 2 g via INTRAVENOUS
  Filled 2023-05-06: qty 100

## 2023-05-06 MED ORDER — KETOROLAC TROMETHAMINE 30 MG/ML IJ SOLN
INTRAMUSCULAR | Status: AC
  Filled 2023-05-06: qty 1

## 2023-05-06 MED ORDER — MIDAZOLAM HCL 2 MG/2ML IJ SOLN
INTRAMUSCULAR | Status: AC
  Filled 2023-05-06: qty 2

## 2023-05-06 MED ORDER — LACTATED RINGERS IV SOLN: INTRAVENOUS | Status: DC | PRN

## 2023-05-06 MED ORDER — ROCURONIUM BROMIDE 10 MG/ML (PF) SYRINGE
PREFILLED_SYRINGE | INTRAVENOUS | Status: DC | PRN
  Administered 2023-05-06: 10 mg via INTRAVENOUS
  Administered 2023-05-06: 50 mg via INTRAVENOUS
  Administered 2023-05-06: 20 mg via INTRAVENOUS
  Administered 2023-05-06: 10 mg via INTRAVENOUS
  Administered 2023-05-06: 20 mg via INTRAVENOUS

## 2023-05-06 MED ORDER — SODIUM CHLORIDE 0.9 % IV SOLN
250.0000 mL | INTRAVENOUS | Status: DC
  Administered 2023-05-06: 250 mL via INTRAVENOUS

## 2023-05-06 MED ORDER — MENTHOL 3 MG MT LOZG: 1.0000 | LOZENGE | OROMUCOSAL | Status: DC | PRN

## 2023-05-06 MED ORDER — SENNOSIDES-DOCUSATE SODIUM 8.6-50 MG PO TABS: 1.0000 | ORAL_TABLET | Freq: Every evening | ORAL | Status: DC | PRN

## 2023-05-06 MED ORDER — SODIUM CHLORIDE 0.9% FLUSH
3.0000 mL | INTRAVENOUS | Status: DC | PRN
Start: 2023-05-06 — End: 2023-05-07

## 2023-05-06 MED ORDER — ORAL CARE MOUTH RINSE: 15.0000 mL | Freq: Once | OROMUCOSAL | Status: AC

## 2023-05-06 MED ORDER — PROPOFOL 10 MG/ML IV BOLUS
INTRAVENOUS | Status: AC
  Filled 2023-05-06: qty 20

## 2023-05-06 MED ORDER — CHLORHEXIDINE GLUCONATE 0.12 % MT SOLN
15.0000 mL | Freq: Once | OROMUCOSAL | Status: AC
  Administered 2023-05-06: 15 mL via OROMUCOSAL
  Filled 2023-05-06: qty 15

## 2023-05-06 MED ORDER — POVIDONE-IODINE 7.5 % EX SOLN
Freq: Once | CUTANEOUS | Status: DC
  Filled 2023-05-06: qty 118

## 2023-05-06 MED ORDER — DOCUSATE SODIUM 100 MG PO CAPS
100.0000 mg | ORAL_CAPSULE | Freq: Two times a day (BID) | ORAL | Status: DC
  Administered 2023-05-06 – 2023-05-07 (×2): 100 mg via ORAL
  Filled 2023-05-06 (×2): qty 1

## 2023-05-06 MED ORDER — BUPIVACAINE-EPINEPHRINE 0.25% -1:200000 IJ SOLN
INTRAMUSCULAR | Status: DC | PRN
  Administered 2023-05-06: 20 mL
  Administered 2023-05-06: 7 mL

## 2023-05-06 MED ORDER — ONDANSETRON HCL 4 MG/2ML IJ SOLN
4.0000 mg | Freq: Four times a day (QID) | INTRAMUSCULAR | Status: DC | PRN
Start: 2023-05-06 — End: 2023-05-07
  Administered 2023-05-06: 4 mg via INTRAVENOUS
  Filled 2023-05-06 (×2): qty 2

## 2023-05-06 MED ORDER — MORPHINE SULFATE (PF) 2 MG/ML IV SOLN: 1.0000 mg | INTRAVENOUS | Status: DC | PRN

## 2023-05-06 MED ORDER — FENTANYL CITRATE (PF) 250 MCG/5ML IJ SOLN
INTRAMUSCULAR | Status: DC | PRN
  Administered 2023-05-06 (×2): 50 ug via INTRAVENOUS
  Administered 2023-05-06: 100 ug via INTRAVENOUS

## 2023-05-06 MED ORDER — BUPIVACAINE LIPOSOME 1.3 % IJ SUSP
INTRAMUSCULAR | Status: DC | PRN
  Administered 2023-05-06: 20 mL

## 2023-05-06 MED ORDER — OXYCODONE-ACETAMINOPHEN 5-325 MG PO TABS
1.0000 | ORAL_TABLET | ORAL | Status: DC | PRN
  Administered 2023-05-07: 1 via ORAL
  Administered 2023-05-07: 2 via ORAL
  Filled 2023-05-06: qty 2
  Filled 2023-05-06: qty 1

## 2023-05-06 MED ORDER — ONDANSETRON HCL 4 MG/2ML IJ SOLN
INTRAMUSCULAR | Status: DC | PRN
  Administered 2023-05-06: 4 mg via INTRAVENOUS

## 2023-05-06 MED ORDER — DEXAMETHASONE SODIUM PHOSPHATE 10 MG/ML IJ SOLN
INTRAMUSCULAR | Status: DC | PRN
  Administered 2023-05-06: 10 mg via INTRAVENOUS

## 2023-05-06 MED ORDER — 0.9 % SODIUM CHLORIDE (POUR BTL) OPTIME
TOPICAL | Status: DC | PRN
  Administered 2023-05-06 (×2): 1000 mL

## 2023-05-06 MED ORDER — FENTANYL CITRATE (PF) 250 MCG/5ML IJ SOLN
INTRAMUSCULAR | Status: AC
  Filled 2023-05-06: qty 5

## 2023-05-06 MED ORDER — SUGAMMADEX SODIUM 200 MG/2ML IV SOLN
INTRAVENOUS | Status: DC | PRN
  Administered 2023-05-06: 150 mg via INTRAVENOUS

## 2023-05-06 MED ORDER — KETOROLAC TROMETHAMINE 30 MG/ML IJ SOLN
INTRAMUSCULAR | Status: DC | PRN
  Administered 2023-05-06: 30 mg via INTRAVENOUS

## 2023-05-06 MED ORDER — OXYCODONE HCL 5 MG PO TABS: 5.0000 mg | ORAL_TABLET | Freq: Once | ORAL | Status: DC | PRN

## 2023-05-06 MED ORDER — ACETAMINOPHEN 650 MG RE SUPP: 650.0000 mg | RECTAL | Status: DC | PRN

## 2023-05-06 MED ORDER — SODIUM CHLORIDE 0.9% FLUSH: 3.0000 mL | Freq: Two times a day (BID) | INTRAVENOUS | Status: DC

## 2023-05-06 MED ORDER — PHENYLEPHRINE 80 MCG/ML (10ML) SYRINGE FOR IV PUSH (FOR BLOOD PRESSURE SUPPORT)
PREFILLED_SYRINGE | INTRAVENOUS | Status: DC | PRN
  Administered 2023-05-06 (×3): 80 ug via INTRAVENOUS

## 2023-05-06 MED ORDER — EPHEDRINE 5 MG/ML INJ
INTRAVENOUS | Status: AC
Start: 2023-05-06 — End: ?
  Filled 2023-05-06: qty 5

## 2023-05-06 MED ORDER — ACETAMINOPHEN 325 MG PO TABS: 650.0000 mg | ORAL_TABLET | ORAL | Status: DC | PRN

## 2023-05-06 MED ORDER — HYDROMORPHONE HCL 1 MG/ML IJ SOLN
INTRAMUSCULAR | Status: AC
  Filled 2023-05-06: qty 0.5

## 2023-05-06 MED ORDER — FLEET ENEMA RE ENEM: 1.0000 | ENEMA | Freq: Once | RECTAL | Status: DC | PRN

## 2023-05-06 MED ORDER — THROMBIN 20000 UNITS EX KIT
PACK | CUTANEOUS | Status: AC
  Filled 2023-05-06: qty 1

## 2023-05-06 MED ORDER — ADULT MULTIVITAMIN W/MINERALS CH
1.0000 | ORAL_TABLET | Freq: Every day | ORAL | Status: DC
Start: 2023-05-07 — End: 2023-05-07
  Administered 2023-05-07: 1 via ORAL
  Filled 2023-05-06: qty 1

## 2023-05-06 MED ORDER — ALUM & MAG HYDROXIDE-SIMETH 200-200-20 MG/5ML PO SUSP: 30.0000 mL | Freq: Four times a day (QID) | ORAL | Status: DC | PRN

## 2023-05-06 MED ORDER — BUPIVACAINE LIPOSOME 1.3 % IJ SUSP
INTRAMUSCULAR | Status: AC
  Filled 2023-05-06: qty 20

## 2023-05-06 MED ORDER — SODIUM CHLORIDE 0.9% FLUSH
10.0000 mL | Freq: Two times a day (BID) | INTRAVENOUS | Status: DC
  Administered 2023-05-06: 10 mL via INTRAVENOUS

## 2023-05-06 MED ORDER — HYDROCODONE-ACETAMINOPHEN 5-325 MG PO TABS
1.0000 | ORAL_TABLET | ORAL | Status: DC | PRN
  Administered 2023-05-06 – 2023-05-07 (×3): 1 via ORAL
  Filled 2023-05-06 (×3): qty 1

## 2023-05-06 MED ORDER — ZOLPIDEM TARTRATE 5 MG PO TABS: 5.0000 mg | ORAL_TABLET | Freq: Every evening | ORAL | Status: DC | PRN

## 2023-05-06 MED ORDER — METHOCARBAMOL 500 MG PO TABS
500.0000 mg | ORAL_TABLET | Freq: Four times a day (QID) | ORAL | Status: DC | PRN
  Administered 2023-05-06 – 2023-05-07 (×3): 500 mg via ORAL
  Filled 2023-05-06 (×4): qty 1

## 2023-05-06 MED ORDER — HYDROMORPHONE HCL 1 MG/ML IJ SOLN
INTRAMUSCULAR | Status: DC | PRN
  Administered 2023-05-06: .5 mg via INTRAVENOUS

## 2023-05-06 MED ORDER — CEFAZOLIN SODIUM-DEXTROSE 2-4 GM/100ML-% IV SOLN
2.0000 g | Freq: Three times a day (TID) | INTRAVENOUS | Status: AC
  Administered 2023-05-06 (×2): 2 g via INTRAVENOUS
  Filled 2023-05-06 (×2): qty 100

## 2023-05-06 MED ORDER — MIDAZOLAM HCL 2 MG/2ML IJ SOLN
INTRAMUSCULAR | Status: DC | PRN
  Administered 2023-05-06: 2 mg via INTRAVENOUS

## 2023-05-06 MED ORDER — FENTANYL CITRATE (PF) 100 MCG/2ML IJ SOLN: 25.0000 ug | INTRAMUSCULAR | Status: DC | PRN

## 2023-05-06 SURGICAL SUPPLY — 91 items
AGENT HMST KT MTR STRL THRMB (HEMOSTASIS)
APL SKNCLS STERI-STRIP NONHPOA (GAUZE/BANDAGES/DRESSINGS) ×1
BAG COUNTER SPONGE SURGICOUNT (BAG) ×2 IMPLANT
BAG SPNG CNTER NS LX DISP (BAG) ×1
BENZOIN TINCTURE PRP APPL 2/3 (GAUZE/BANDAGES/DRESSINGS) ×2 IMPLANT
BLADE CLIPPER SURG (BLADE) IMPLANT
BUR PRESCISION 1.7 ELITE (BURR) ×2 IMPLANT
BUR ROUND FLUTED 5 RND (BURR) ×2 IMPLANT
BUR ROUND PRECISION 4.0 (BURR) IMPLANT
BUR SABER RD CUTTING 3.0 (BURR) IMPLANT
CAGE SABLE 10X22 6-12 0D (Cage) IMPLANT
CNTNR URN SCR LID CUP LEK RST (MISCELLANEOUS) ×2 IMPLANT
CONT SPEC 4OZ STRL OR WHT (MISCELLANEOUS) ×1
COVER BACK TABLE 60X90IN (DRAPES) ×2 IMPLANT
COVER MAYO STAND STRL (DRAPES) ×4 IMPLANT
COVER SURGICAL LIGHT HANDLE (MISCELLANEOUS) ×2 IMPLANT
DRAIN CHANNEL 15F RND FF W/TCR (WOUND CARE) IMPLANT
DRAPE C-ARM 42X72 X-RAY (DRAPES) ×2 IMPLANT
DRAPE C-ARMOR (DRAPES) IMPLANT
DRAPE POUCH INSTRU U-SHP 10X18 (DRAPES) ×2 IMPLANT
DRAPE SURG 17X23 STRL (DRAPES) ×8 IMPLANT
DURAPREP 26ML APPLICATOR (WOUND CARE) ×2 IMPLANT
ELECT BLADE 4.0 EZ CLEAN MEGAD (MISCELLANEOUS) ×1
ELECT CAUTERY BLADE 6.4 (BLADE) ×2 IMPLANT
ELECT REM PT RETURN 9FT ADLT (ELECTROSURGICAL) ×1
ELECTRODE BLDE 4.0 EZ CLN MEGD (MISCELLANEOUS) ×2 IMPLANT
ELECTRODE REM PT RTRN 9FT ADLT (ELECTROSURGICAL) ×2 IMPLANT
EVACUATOR SILICONE 100CC (DRAIN) IMPLANT
FILTER STRAW FLUID ASPIR (MISCELLANEOUS) ×2 IMPLANT
GAUZE 4X4 16PLY ~~LOC~~+RFID DBL (SPONGE) ×2 IMPLANT
GAUZE SPONGE 4X4 12PLY STRL (GAUZE/BANDAGES/DRESSINGS) ×2 IMPLANT
GLOVE BIO SURGEON STRL SZ 6.5 (GLOVE) ×2 IMPLANT
GLOVE BIO SURGEON STRL SZ8 (GLOVE) ×2 IMPLANT
GLOVE BIOGEL PI IND STRL 7.0 (GLOVE) ×2 IMPLANT
GLOVE BIOGEL PI IND STRL 8 (GLOVE) ×2 IMPLANT
GLOVE SURG ENC MOIS LTX SZ6.5 (GLOVE) ×2 IMPLANT
GOWN STRL REUS W/ TWL LRG LVL3 (GOWN DISPOSABLE) ×4 IMPLANT
GOWN STRL REUS W/ TWL XL LVL3 (GOWN DISPOSABLE) ×2 IMPLANT
GOWN STRL REUS W/TWL LRG LVL3 (GOWN DISPOSABLE) ×2
GOWN STRL REUS W/TWL XL LVL3 (GOWN DISPOSABLE) ×1
IV CATH 14GX2 1/4 (CATHETERS) ×2 IMPLANT
KIT BASIN OR (CUSTOM PROCEDURE TRAY) ×2 IMPLANT
KIT POSITION SURG JACKSON T1 (MISCELLANEOUS) ×2 IMPLANT
KIT TURNOVER KIT B (KITS) ×2 IMPLANT
MARKER SKIN DUAL TIP RULER LAB (MISCELLANEOUS) ×4 IMPLANT
MIX DBX 10CC 35% BONE (Bone Implant) IMPLANT
NDL 18GX1X1/2 (RX/OR ONLY) (NEEDLE) ×2 IMPLANT
NDL 22X1.5 STRL (OR ONLY) (MISCELLANEOUS) ×4 IMPLANT
NDL HYPO 25GX1X1/2 BEV (NEEDLE) ×2 IMPLANT
NDL SPNL 18GX3.5 QUINCKE PK (NEEDLE) ×4 IMPLANT
NEEDLE 18GX1X1/2 (RX/OR ONLY) (NEEDLE) ×1 IMPLANT
NEEDLE 22X1.5 STRL (OR ONLY) (MISCELLANEOUS) ×2 IMPLANT
NEEDLE HYPO 25GX1X1/2 BEV (NEEDLE) ×1 IMPLANT
NEEDLE SPNL 18GX3.5 QUINCKE PK (NEEDLE) ×2 IMPLANT
NS IRRIG 1000ML POUR BTL (IV SOLUTION) ×2 IMPLANT
PACK LAMINECTOMY ORTHO (CUSTOM PROCEDURE TRAY) ×2 IMPLANT
PACK UNIVERSAL I (CUSTOM PROCEDURE TRAY) ×2 IMPLANT
PAD ARMBOARD 7.5X6 YLW CONV (MISCELLANEOUS) ×4 IMPLANT
PATTIES SURGICAL .5 X1 (DISPOSABLE) ×2 IMPLANT
PATTIES SURGICAL .5X1.5 (GAUZE/BANDAGES/DRESSINGS) ×2 IMPLANT
PUTTY DBX 2.5CC (Putty) ×1 IMPLANT
PUTTY DBX 2.5CC DEPUY (Putty) IMPLANT
ROD PRE BENT EXP 40MM (Rod) IMPLANT
ROD PRE BENT EXPEDIUM 35MM (Rod) IMPLANT
SCREW CORTICAL VIPER 7X40MM (Screw) IMPLANT
SCREW SET SINGLE INNER (Screw) IMPLANT
SCREW VIPER CORT FIX 6.00X30 (Screw) IMPLANT
SPONGE INTESTINAL PEANUT (DISPOSABLE) ×2 IMPLANT
SPONGE SURGIFOAM ABS GEL 100 (HEMOSTASIS) ×2 IMPLANT
STRIP CLOSURE SKIN 1/2X4 (GAUZE/BANDAGES/DRESSINGS) ×4 IMPLANT
SURGIFLO W/THROMBIN 8M KIT (HEMOSTASIS) IMPLANT
SUT MNCRL AB 4-0 PS2 18 (SUTURE) ×2 IMPLANT
SUT VIC AB 0 CT1 18XCR BRD 8 (SUTURE) ×2 IMPLANT
SUT VIC AB 0 CT1 8-18 (SUTURE) ×1
SUT VIC AB 1 CT1 18XCR BRD 8 (SUTURE) ×2 IMPLANT
SUT VIC AB 1 CT1 8-18 (SUTURE) ×1
SUT VIC AB 2-0 CT2 18 VCP726D (SUTURE) ×2 IMPLANT
SYR 20ML LL LF (SYRINGE) ×4 IMPLANT
SYR BULB IRRIG 60ML STRL (SYRINGE) ×2 IMPLANT
SYR CONTROL 10ML LL (SYRINGE) ×4 IMPLANT
SYR TB 1ML LUER SLIP (SYRINGE) ×2 IMPLANT
TAP EXPEDIUM DL 4.35 (INSTRUMENTS) IMPLANT
TAP EXPEDIUM DL 5.0 (INSTRUMENTS) IMPLANT
TAP EXPEDIUM DL 6.0 (INSTRUMENTS) IMPLANT
TAP EXPEDIUM DL 7.0 (INSTRUMENTS) ×1
TAP EXPEDIUM DL 7X2 (INSTRUMENTS) IMPLANT
TAPE CLOTH SOFT 2X10 (GAUZE/BANDAGES/DRESSINGS) IMPLANT
TRAY FOLEY MTR SLVR 16FR STAT (SET/KITS/TRAYS/PACK) ×2 IMPLANT
TUBE FUNNEL GL DISP (ORTHOPEDIC DISPOSABLE SUPPLIES) IMPLANT
WATER STERILE IRR 1000ML POUR (IV SOLUTION) ×2 IMPLANT
YANKAUER SUCT BULB TIP NO VENT (SUCTIONS) ×2 IMPLANT

## 2023-05-06 NOTE — Op Note (Signed)
PATIENT NAME: Alexis Hartman   MEDICAL RECORD NO.:   [id number]   DATE OF BIRTH: September 10, 1965   DATE OF PROCEDURE: 05/06/2023                               OPERATIVE REPORT   PREOPERATIVE DIAGNOSES: 1. Left-sided lumbar radiculopathy 2. Status post previous L5-S1 decompression in 2018 3. 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 Left-sided lumbar radiculopathy 2. Status post previous L5-S1 decompression in 2018 3. 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 Left-sided L5-S1 transforaminal lumbar interbody fusion. 2. Right-sided L5-S1 posterolateral fusion. 3. Insertion of interbody device x1 (Globus expandable intervertebral spacer). 4. Placement of posterior instrumentation at L5, S1 bilaterally. 5. Use of local autograft. 6. Use of morselized allograft - DBX mix 7. Intraoperative use of fluoroscopy.   SURGEON:  Estill Bamberg, MD.   ASSISTANTJason Coop, PA-C.   ANESTHESIA:  General endotracheal anesthesia.   COMPLICATIONS:  None.   DISPOSITION:  Stable.   ESTIMATED BLOOD LOSS:   Minimal   INDICATIONS FOR SURGERY:  Briefly, Ms. Kerney Elbe is a pleasant 57 year old female who did present to me with severe and ongoing pain in the left leg.  As noted above, she is status post a decompression at L5-S1 6 years ago.  We did proceed with nonoperative treatment measures, but she did continue to have ongoing significant pain in the left leg. We did have an extensive discussion about surgery, and she did elect to proceed with the procedure noted above.      OPERATIVE DETAILS:  On 05/06/2023, the patient was brought to surgery and general endotracheal anesthesia was administered.  The patient was placed prone on a well-padded flat Jackson bed with a spinal frame.  Antibiotics were given and a time-out procedure was performed. The back was prepped and  draped in the usual fashion.  A midline incision was made overlying the L5-S1 intervertebral space.  The fascia was incised at the midline.  The paraspinal musculature was bluntly swept laterally.  Anatomic landmarks for the pedicles were exposed. Using fluoroscopy, I did cannulate the L5 and S1 pedicles bilaterally, using a medial to lateral cortical trajectory technique.  On the right side, the posterolateral gutter and right facet joint at L5/S1 was decorticated and a 6 x 30 mm screw was placed at L5 on the right, and a 7 x 40 mm screw was placed on the left at S1. A 40-mm rod was placed and distraction was applied across the rod on the right side.  On the left side, the cannulated pedicle holes were filled with bone wax.  I then proceeded with the decompressive aspect of the procedure.  On the left side, there was abundant scar and granulation tissue noted at the laminotomy defect on the left at L5-S1.  These adhesions were released, and I was able to gently medialize the traversing left S1 nerve.  The disc herniation ventral to the nerve was readily noted.  It was removed in multiple fragments, decompressing the traversing left S1 nerve.  At this point, with ongoing gentle medialization of the nerve, I did perform an annulotomy at the posterolateral aspect of the L5/S1 intervertebral space.  I then used a series of curettes and pituitary rongeurs to perform a thorough and complete intervertebral diskectomy.  The intervertebral space was then liberally packed  with autograft as well as allograft in the form of DBX mix, as was the appropriate-sized intervertebral spacer.  The spacer was then tamped into position in the usual fashion and expanded to 10.1 mm in height.  I was very pleased with the press-fit of the spacer.  I then placed a 6 x 30 mm screw on the left at L5 and a 7 x 40 mm screw on the left at S1.  A 35 mm rod was then placed and caps were placed. The distraction was then released  on the contralateral right side.  All 4 caps were then locked.  The wound was copiously irrigated with a total of approximately 3 L prior to placing the bone graft.  Additional autograft and allograft were then packed into the posterolateral gutter on the right side to help aid in the L5-S1 fusion.  The wound was explored for any undue bleeding and there was no substantial bleeding encountered.  Gel-Foam was placed over the laminectomy site.  The wound was then closed in layers using #1 Vicryl followed by 2-0 Vicryl, followed by 4-0 Monocryl.  Benzoin and Steri-Strips were applied followed by sterile dressing.  Of note, Jason Coop was my assistant throughout surgery, and did aid in retraction, suctioning, and closure.     Estill Bamberg, MD

## 2023-05-06 NOTE — Anesthesia Procedure Notes (Signed)
Procedure Name: Intubation Date/Time: 05/06/2023 8:52 AM  Performed by: Thomasene Ripple, CRNAPre-anesthesia Checklist: Patient identified, Emergency Drugs available, Suction available and Patient being monitored Patient Re-evaluated:Patient Re-evaluated prior to induction Oxygen Delivery Method: Circle System Utilized Preoxygenation: Pre-oxygenation with 100% oxygen Induction Type: IV induction Ventilation: Mask ventilation without difficulty Laryngoscope Size: Miller and 3 Grade View: Grade I Tube type: Oral Tube size: 7.0 mm Number of attempts: 1 Airway Equipment and Method: Stylet and Oral airway Placement Confirmation: ETT inserted through vocal cords under direct vision, positive ETCO2 and breath sounds checked- equal and bilateral Secured at: 22 cm Tube secured with: Tape Dental Injury: Teeth and Oropharynx as per pre-operative assessment

## 2023-05-06 NOTE — Anesthesia Preprocedure Evaluation (Signed)
Anesthesia Evaluation  Patient identified by MRN, date of birth, ID band Patient awake    Reviewed: Allergy & Precautions, H&P , NPO status , Patient's Chart, lab work & pertinent test results  Airway Mallampati: II   Neck ROM: full    Dental   Pulmonary asthma    breath sounds clear to auscultation       Cardiovascular negative cardio ROS  Rhythm:regular Rate:Normal     Neuro/Psych  Headaches, Seizures -,   Neuromuscular disease    GI/Hepatic ,GERD  ,,  Endo/Other  diabetes, Type 2    Renal/GU      Musculoskeletal   Abdominal   Peds  Hematology   Anesthesia Other Findings   Reproductive/Obstetrics                             Anesthesia Physical Anesthesia Plan  ASA: 2  Anesthesia Plan: General   Post-op Pain Management:    Induction: Intravenous  PONV Risk Score and Plan: 3 and Ondansetron, Dexamethasone, Midazolam and Treatment may vary due to age or medical condition  Airway Management Planned: Oral ETT  Additional Equipment:   Intra-op Plan:   Post-operative Plan: Extubation in OR  Informed Consent: I have reviewed the patients History and Physical, chart, labs and discussed the procedure including the risks, benefits and alternatives for the proposed anesthesia with the patient or authorized representative who has indicated his/her understanding and acceptance.     Dental advisory given  Plan Discussed with: CRNA, Anesthesiologist and Surgeon  Anesthesia Plan Comments:        Anesthesia Quick Evaluation

## 2023-05-06 NOTE — H&P (Signed)
PREOPERATIVE H&P  Chief Complaint: Left leg pain  HPI: Alexis Hartman is a 57 y.o. female who presents with ongoing pain in the left leg  MRI reveals a recurrent left L5/S1 disc herniation  Patient has failed multiple forms of conservative care and continues to have pain (see office notes for additional details regarding the patient's full course of treatment)  Past Medical History:  Diagnosis Date   Acute bronchitis    Allergic rhinitis, cause unspecified    Anemia    during pregnancy   Asthma    Atypical face pain    right ear   Cervicalgia    Esophageal reflux    hx of   Headache(784.0)    Other convulsions    pseudo seizures  2002- none since then   Pre-diabetes    Pure hypercholesterolemia    no prescription medications taken   Past Surgical History:  Procedure Laterality Date   ABDOMINAL HYSTERECTOMY     CYSTECTOMY  01/2004   Dr. Eda Paschal   LAPAROSCOPIC CHOLECYSTECTOMY  05/2010   Dr. Abbey Chatters   LUMBAR LAMINECTOMY/DECOMPRESSION MICRODISCECTOMY Left 01/07/2017   Procedure: LEFT SIDED LUMBAR 5-SACRUM 1 MICRODISCECTOMY;  Surgeon: Estill Bamberg, MD;  Location: [facility] OR;  Service: Orthopedics;  Laterality: Left;  LEFT SIDED LUMBAR 5-SACRUM 1 MICRODISCECTOMY; REQUEST 2 HOURS AND FLIP ROOM   VESICOVAGINAL FISTULA CLOSURE W/ TAH  01/2004   Dr. Eda Paschal   Social History   Socioeconomic History   Marital status: Married    Spouse name: Not on file   Number of children: 3   Years of education: Not on file   Highest education level: Some college, no degree  Occupational History   Occupation: Event organiser: [employer]  Tobacco Use   Smoking status: Never   Smokeless tobacco: Never  Vaping Use   Vaping status: Never Used  Substance and Sexual Activity   Alcohol use: Never   Drug use: Never   Sexual activity: Not on file  Other Topics Concern   Not on file  Social History Narrative   Not on file   Social Determinants of Health    Financial Resource Strain: Low Risk  (10/15/2022)   Overall Financial Resource Strain (CARDIA)    Difficulty of Paying Living Expenses: Not hard at all  Food Insecurity: No Food Insecurity (10/15/2022)   Hunger Vital Sign    Worried About Running Out of Food in the Last Year: Never true    Ran Out of Food in the Last Year: Never true  Transportation Needs: No Transportation Needs (10/15/2022)   PRAPARE - Administrator, Civil Service (Medical): No    Lack of Transportation (Non-Medical): No  Physical Activity: Sufficiently Active (10/15/2022)   Exercise Vital Sign    Days of Exercise per Week: 7 days    Minutes of Exercise per Session: 90 min  Stress: No Stress Concern Present (10/15/2022)   [employer] of Occupational Health - Occupational Stress Questionnaire    Feeling of Stress : Not at all  Social Connections: Socially Integrated (10/15/2022)   Social Connection and Isolation Panel [NHANES]    Frequency of Communication with Friends and Family: More than three times a week    Frequency of Social Gatherings with Friends and Family: Twice a week    Attends Religious Services: More than 4 times per year    Active Member of [employer] or Organizations: Yes    Attends Banker Meetings:  1 to 4 times per year    Marital Status: Married   Family History  Problem Relation Age of Onset   Hypertension Mother    Diabetes Mother    Alcohol abuse Mother        in remission   Stroke Mother    Kidney disease Mother    Diabetes Father    Asthma Sister    Cirrhosis Brother    Depression Paternal Uncle    Hyperlipidemia Maternal Grandmother    Colon cancer Neg Hx    Esophageal cancer Neg Hx    Stomach cancer Neg Hx    Rectal cancer Neg Hx    Allergies  Allergen Reactions   Statins Other (See Comments)    Atorvastatin, rosuvastatin - muscle pain   Prior to Admission medications   Medication Sig Start Date End Date Taking? Authorizing Provider  Multiple  Vitamin (MULTIVITAMIN) tablet Take 1 tablet by mouth daily.   Yes [provider]  Omega-3 Fatty Acids (OMEGA 3 PO) Take 1 capsule by mouth daily.   Yes [provider]  albuterol (VENTOLIN HFA) 108 (90 Base) MCG/ACT inhaler Inhale 2 puffs into the lungs every 6 (six) hours as needed for wheezing or shortness of breath. Patient not taking: Reported on 04/29/2023 11/13/22   Margaretann Loveless, PA-C  clobetasol cream (TEMOVATE) 0.05 % Apply 1 Application topically 2 (two) times daily. Do not use for more than 14 days at a time Patient not taking: Reported on 04/29/2023 04/21/23   Pincus Sanes, MD  meloxicam (MOBIC) 15 MG tablet Take 15 mg by mouth daily. 03/22/23   [provider]  methocarbamol (ROBAXIN) 500 MG tablet Take 500-1,000 mg by mouth every 6 (six) hours as needed for muscle spasms. 03/30/23   [provider]     All other systems have been reviewed and were otherwise negative with the exception of those mentioned in the HPI and as above.  Physical Exam: Vitals:   05/06/23 0723  BP: (!) 158/93  Pulse: (!) 45  Resp: 18  SpO2: 99%    Body mass index is 27.91 kg/m.  General: Alert, no acute distress Cardiovascular: No pedal edema Respiratory: No cyanosis, no use of accessory musculature Skin: No lesions in the area of chief complaint Neurologic: Sensation intact distally Psychiatric: Patient is competent for consent with normal mood and affect Lymphatic: No axillary or cervical lymphadenopathy   Assessment/Plan: LEFT-SIDED LUMBAR RADICULOPATHY DUE TO A RECURRENT LEFT L5/S1 DISC HERNIATION Plan for Procedure(s): LEFT-SIDED LUMBAR 5 - SACRUM 1 TRANSFORAMINAL LUMBAR INTERBODY FUSION AND DECOMPRESSION WITH INSTRUMENTATION AND ALLOGRAFT   Jackelyn Hoehn, MD 05/06/2023 8:17 AM

## 2023-05-06 NOTE — Progress Notes (Signed)
Nurse called to the room patient c/o n/v, about to give the patient Zofran, patient became nonverbal, L harm shaking and eye fluttering,V/S stable, cbg 112. RR called. See RR note. MD and NP aware. Patient is back to her baseline now. Will continue to monitor.

## 2023-05-06 NOTE — Progress Notes (Signed)
Patient felt like she couldn't talk.  When I arrived she was fluttering her eyelids.  She is warm and dry.  BP 145/78  HR 63  RR 16 O2 sat 100% on RA.  Washed her face with a wash cloth.  She was able to speak to me hesitantly.  She denies pain.  She does complain of some nausea but doesn't currently want to take any zofran at this time but knows she can have the medication.  Gave her some warm blankets.  She states that she feels better.

## 2023-05-06 NOTE — Transfer of Care (Signed)
Immediate Anesthesia Transfer of Care Note  Patient: Alexis Hartman  Procedure(s) Performed: LEFT-SIDED LUMBAR 5 - SACRUM 1 TRANSFORAMINAL LUMBAR INTERBODY FUSION AND DECOMPRESSION WITH INSTRUMENTATION AND ALLOGRAFT (Left: Spine Lumbar)  Patient Location: PACU  Anesthesia Type:General  Level of Consciousness: drowsy  Airway & Oxygen Therapy: Patient Spontanous Breathing and Patient connected to face mask oxygen  Post-op Assessment: Report given to RN and Post -op Vital signs reviewed and stable  Post vital signs: Reviewed and stable  Last Vitals:  Vitals Value Taken Time  BP 96/50 05/06/23 1241  Temp    Pulse 53 05/06/23 1245  Resp 15 05/06/23 1245  SpO2 99 % 05/06/23 1245  Vitals shown include unfiled device data.  Last Pain:  Vitals:   05/06/23 0723  TempSrc: Oral  PainSc:       Patients Stated Pain Goal: 2 (05/06/23 0711)  Complications: No notable events documented.

## 2023-05-07 ENCOUNTER — Other Ambulatory Visit (HOSPITAL_COMMUNITY): Payer: Self-pay

## 2023-05-07 DIAGNOSIS — M5117 Intervertebral disc disorders with radiculopathy, lumbosacral region: Secondary | ICD-10-CM | POA: Diagnosis not present

## 2023-05-07 MED ORDER — OXYCODONE-ACETAMINOPHEN 5-325 MG PO TABS
1.0000 | ORAL_TABLET | ORAL | 0 refills | Status: DC | PRN
  Filled 2023-05-07: qty 30, 3d supply, fill #0

## 2023-05-07 MED FILL — Thrombin For Soln Kit 20000 Unit: CUTANEOUS | Qty: 1 | Status: AC

## 2023-05-07 NOTE — Plan of Care (Signed)
  Problem: Education: Goal: Knowledge of General Education information will improve Description: Including pain rating scale, medication(s)/side effects and non-pharmacologic comfort measures Outcome: Completed/Met   Problem: Health Behavior/Discharge Planning: Goal: Ability to manage health-related needs will improve Outcome: Completed/Met   Problem: Clinical Measurements: Goal: Ability to maintain clinical measurements within normal limits will improve Outcome: Completed/Met Goal: Will remain free from infection Outcome: Completed/Met Goal: Diagnostic test results will improve Outcome: Completed/Met Goal: Respiratory complications will improve Outcome: Completed/Met Goal: Cardiovascular complication will be avoided Outcome: Completed/Met   Problem: Activity: Goal: Risk for activity intolerance will decrease Outcome: Completed/Met   Problem: Nutrition: Goal: Adequate nutrition will be maintained Outcome: Completed/Met   Problem: Coping: Goal: Level of anxiety will decrease Outcome: Completed/Met   Problem: Elimination: Goal: Will not experience complications related to bowel motility Outcome: Completed/Met Goal: Will not experience complications related to urinary retention Outcome: Completed/Met   Problem: Pain Management: Goal: General experience of comfort will improve Outcome: Completed/Met   Problem: Safety: Goal: Ability to remain free from injury will improve Outcome: Completed/Met   Problem: Skin Integrity: Goal: Risk for impaired skin integrity will decrease Outcome: Completed/Met   Problem: Education: Goal: Ability to verbalize activity precautions or restrictions will improve Outcome: Completed/Met Goal: Knowledge of the prescribed therapeutic regimen will improve Outcome: Completed/Met Goal: Understanding of discharge needs will improve Outcome: Completed/Met   Problem: Activity: Goal: Ability to avoid complications of mobility impairment will  improve Outcome: Completed/Met Goal: Ability to tolerate increased activity will improve Outcome: Completed/Met Goal: Will remain free from falls Outcome: Completed/Met   Problem: Bowel/Gastric: Goal: Gastrointestinal status for postoperative course will improve Outcome: Completed/Met   Problem: Clinical Measurements: Goal: Ability to maintain clinical measurements within normal limits will improve Outcome: Completed/Met Goal: Postoperative complications will be avoided or minimized Outcome: Completed/Met Goal: Diagnostic test results will improve Outcome: Completed/Met   Problem: Pain Management: Goal: Pain level will decrease Outcome: Completed/Met   Problem: Skin Integrity: Goal: Will show signs of wound healing Outcome: Completed/Met   Problem: Health Behavior/Discharge Planning: Goal: Identification of resources available to assist in meeting health care needs will improve Outcome: Completed/Met   Problem: Bladder/Genitourinary: Goal: Urinary functional status for postoperative course will improve Outcome: Completed/Met   Problem: Acute Rehab PT Goals(only PT should resolve) Goal: Pt will Roll Supine to Side Outcome: Completed/Met Goal: Pt Will Go Supine/Side To Sit Outcome: Completed/Met Goal: Patient Will Transfer Sit To/From Stand Outcome: Completed/Met Goal: Pt Will Ambulate Outcome: Completed/Met Goal: Pt Will Go Up/Down Stairs Outcome: Completed/Met

## 2023-05-07 NOTE — Progress Notes (Signed)
    Patient doing well  Patient denies leg pain Minimal back pain   Physical Exam: Vitals:   05/07/23 0735 05/07/23 0736  BP: (!) 151/81   Pulse: (!) 45 80  Resp: 16   Temp: 98.4 F (36.9 C)   SpO2: 99% 95%    Dressing in place NVI  POD #1 s/p L5/S1 decompression and fusion, doing well  - up with PT/OT, encourage ambulation - Percocet for pain, Robaxin for muscle spasms - d/c home today with f/u in 2 weeks

## 2023-05-07 NOTE — Evaluation (Signed)
Occupational Therapy Evaluation Patient Details Name: Alexis Hartman MRN: [id number] DOB: 29-Mar-1966 Today's Date: 05/07/2023   History of Present Illness The pt is a 57 yo female presenting 11/6 for TLIF L5-S1. PMH includes: asthma, headache, pre-diabetes.   Clinical Impression   Alexis Hartman was evaluated s/p the above spine surgery. She is indep, lives with family and works at baseline. Upon evaluation pt was limited by surgical pain, spinal precautions, compensatory techniques and generalized weakness. Overall she needed generalized superivsion A for transfers, mobility and ADLs after education. Provided cues and education on spinal precautions and compensatory techniques throughout, handout provided and pt demonstrated good recall during ADLs and mobility. Pt does not require further acute OT services. Recommend d/c home with support of family.         If plan is discharge home, recommend the following: A little help with walking and/or transfers;A little help with bathing/dressing/bathroom;Assistance with cooking/housework;Assist for transportation    Functional Status Assessment  Patient has had a recent decline in their functional status and demonstrates the ability to make significant improvements in function in a reasonable and predictable amount of time.  Equipment Recommendations  Other (comment);BSC/3in1 (RW)       Precautions / Restrictions Precautions Precautions: Fall;Back Precaution Booklet Issued: Yes (comment) Required Braces or Orthoses: Spinal Brace Spinal Brace: Applied in sitting position;Thoracolumbosacral orthotic Restrictions Weight Bearing Restrictions: No      Mobility Bed Mobility Overal bed mobility: Needs Assistance Bed Mobility: Rolling, Sidelying to Sit Rolling: Supervision Sidelying to sit: Supervision       General bed mobility comments: good demo of log roll    Transfers Overall transfer level: Needs assistance Equipment used: Rolling  walker (2 wheels), None Transfers: Sit to/from Stand Sit to Stand: Supervision                  Balance Overall balance assessment: Mild deficits observed, not formally tested         ADL either performed or assessed with clinical judgement   ADL Overall ADL's : Needs assistance/impaired Eating/Feeding: Independent   Grooming: Supervision/safety   Upper Body Bathing: Set up;Sitting   Lower Body Bathing: Supervison/ safety;Sit to/from stand   Upper Body Dressing : Set up;Sitting   Lower Body Dressing: Supervision/safety;Sit to/from stand   Toilet Transfer: Supervision/safety;Ambulation;Rolling walker (2 wheels)   Toileting- Clothing Manipulation and Hygiene: Supervision/safety;Sitting/lateral lean       Functional mobility during ADLs: Supervision/safety;Rolling walker (2 wheels) General ADL Comments: cues throughout for compensatory techniques, pt able to get into figure four position for LB ADLs     Vision Baseline Vision/History: 0 No visual deficits Vision Assessment?: No apparent visual deficits     Perception Perception: Within Functional Limits       Praxis Praxis: WFL       Pertinent Vitals/Pain Pain Assessment Pain Assessment: Faces Faces Pain Scale: Hurts little more Pain Location: incision, LLE Pain Descriptors / Indicators: Discomfort, Grimacing, Guarding, Sore Pain Intervention(s): Limited activity within patient's tolerance, Monitored during session     Extremity/Trunk Assessment Upper Extremity Assessment Upper Extremity Assessment: Overall WFL for tasks assessed   Lower Extremity Assessment Lower Extremity Assessment: Defer to PT evaluation RLE Deficits / Details: reports was more painful before surgery, now [facility] RLE Sensation: WNL RLE Coordination: WNL LLE Deficits / Details: reports pain from incision down back of thigh. increased instability when fatigued but no overt buckling. LLE Sensation: decreased light touch LLE  Coordination: WNL   Cervical / Trunk Assessment Cervical /  Trunk Assessment: Back Surgery   Communication Communication Communication: No apparent difficulties Cueing Techniques: Verbal cues   Cognition Arousal: Alert Behavior During Therapy: WFL for tasks assessed/performed Overall Cognitive Status: Within Functional Limits for tasks assessed           General Comments: good recall of BLT after review     General Comments  VSS on RA, family present     Home Living Family/patient expects to be discharged to:: Private residence Living Arrangements: Spouse/significant other;Children Available Help at Discharge: Family;Available 24 hours/day Type of Home: House Home Access: Level entry     Home Layout: Two level;Bed/bath upstairs Alternate Level Stairs-Number of Steps: 20 (10 + 10) Alternate Level Stairs-Rails: Right Bathroom Shower/Tub: Producer, television/film/video: Handicapped height Bathroom Accessibility: Yes   Home Equipment: None   Additional Comments: 10+10 stairs to get to bedroom/bathroom. rail on R for first set then L`      Prior Functioning/Environment Prior Level of Function : Independent/Modified Independent             Mobility Comments: no use of DME at home, independent, attending gym ADLs Comments: independent, works from home, drives        OT Problem List: Decreased activity tolerance;Decreased range of motion;Impaired balance (sitting and/or standing);Decreased safety awareness;Decreased knowledge of use of DME or AE;Decreased knowledge of precautions         OT Goals(Current goals can be found in the care plan section) Acute Rehab OT Goals Patient Stated Goal: home OT Goal Formulation: With patient Time For Goal Achievement: 05/07/23 Potential to Achieve Goals: Good   AM-PAC OT "6 Clicks" Daily Activity     Outcome Measure Help from another person eating meals?: None Help from another person taking care of personal  grooming?: A Little Help from another person toileting, which includes using toliet, bedpan, or urinal?: A Little Help from another person bathing (including washing, rinsing, drying)?: A Little Help from another person to put on and taking off regular upper body clothing?: A Little Help from another person to put on and taking off regular lower body clothing?: A Little 6 Click Score: 19   End of Session Equipment Utilized During Treatment: Rolling walker (2 wheels);Back brace Nurse Communication: Mobility status  Activity Tolerance: Patient tolerated treatment well Patient left: in bed;with call bell/phone within reach;with family/visitor present  OT Visit Diagnosis: Unsteadiness on feet (R26.81);Other abnormalities of gait and mobility (R26.89);Muscle weakness (generalized) (M62.81);Pain                Time: 0454-0981 OT Time Calculation (min): 17 min Charges:  OT General Charges $OT Visit: 1 Visit OT Evaluation $OT Eval Low Complexity: 1 Low  Derenda Mis, OTR/L Acute Rehabilitation Services Office [phone] Secure Chat Communication Preferred   Donia Pounds 05/07/2023, 9:51 AM

## 2023-05-07 NOTE — Anesthesia Postprocedure Evaluation (Signed)
Anesthesia Post Note  Patient: Alexis Hartman  Procedure(s) Performed: LEFT-SIDED LUMBAR 5 - SACRUM 1 TRANSFORAMINAL LUMBAR INTERBODY FUSION AND DECOMPRESSION WITH INSTRUMENTATION AND ALLOGRAFT (Left: Spine Lumbar)     Patient location during evaluation: PACU Anesthesia Type: General Level of consciousness: awake and alert Pain management: pain level controlled Vital Signs Assessment: post-procedure vital signs reviewed and stable Respiratory status: spontaneous breathing, nonlabored ventilation, respiratory function stable and patient connected to nasal cannula oxygen Cardiovascular status: blood pressure returned to baseline and stable Postop Assessment: no apparent nausea or vomiting Anesthetic complications: no   No notable events documented.  Last Vitals:  Vitals:   05/07/23 0735 05/07/23 0736  BP: (!) 151/81   Pulse: (!) 45 80  Resp: 16   Temp: 36.9 C   SpO2: 99% 95%    Last Pain:  Vitals:   05/07/23 0735  TempSrc: Oral  PainSc:                  Senai Ramnath S

## 2023-05-07 NOTE — TOC Transition Note (Signed)
Transition of Care [facility]) - CM/SW Discharge Note   Patient Details  Name: Alexis Hartman MRN: [id number] Date of Birth: 14-Sep-1965  Transition of Care [facility] CM/SW Contact:  Kermit Balo, RN Phone Number: 05/07/2023, 11:37 AM   Clinical Narrative:     Patient is discharging home with home health services through [facility]. Information on the AVS. [facility] will contact her for the first home visit. Spouse providing transport home.   Final next level of care: Home w Home Health Services Barriers to Discharge: No Barriers Identified   Patient Goals and CMS Choice CMS Medicare.gov Compare Post Acute Care list provided to:: Patient Choice offered to / list presented to : Patient  Discharge Placement                         Discharge Plan and Services Additional resources added to the After Visit Summary for                            [facility] Arranged: PT HH Agency: [facility] Date [facility] Agency Contacted: 05/07/23   Representative spoke with at [facility] Agency: kelly  Social Determinants of Health (SDOH) Interventions SDOH Screenings   Food Insecurity: No Food Insecurity (10/15/2022)  Housing: Low Risk  (10/15/2022)  Transportation Needs: No Transportation Needs (10/15/2022)  Depression (PHQ2-9): Low Risk  (04/21/2023)  Financial Resource Strain: Low Risk  (10/15/2022)  Physical Activity: Sufficiently Active (10/15/2022)  Social Connections: Socially Integrated (10/15/2022)  Stress: No Stress Concern Present (10/15/2022)  Tobacco Use: Low Risk  (05/06/2023)     Readmission Risk Interventions     No data to display

## 2023-05-07 NOTE — Evaluation (Signed)
Physical Therapy Evaluation Patient Details Name: Alexis Hartman MRN: [id number] DOB: May 08, 1966 Today's Date: 05/07/2023  History of Present Illness  The pt is a 57 yo female presenting 11/6 for TLIF L5-S1. PMH includes: asthma, headache, pre-diabetes.  Clinical Impression  Pt in bed upon arrival of PT, agreeable to evaluation at this time. Prior to admission the pt was independent with all mobility, but progressively limited by pain. She reports no use of DME, frequent activity, and participation in exercise. The pt was able to complete sit-stand transfers with CGA, benefits from intermittent HHA with standing and walking initially, but progressed to need for BUE support as she fatigued. Pt with instability in L knee, no overt buckling, but progressive instability with fatigue, and therefore recommend use of RW in short term as well as HHPT to improve functional strength, stability, and endurance. The pt and her family were educated on stair training, spinal precautions, and brace use. All questions answered at this time. She is safe to return home with family support when medically cleared.      If plan is discharge home, recommend the following: A little help with walking and/or transfers;A little help with bathing/dressing/bathroom;Direct supervision/assist for medications management;Direct supervision/assist for financial management;Assist for transportation;Help with stairs or ramp for entrance   Can travel by private vehicle        Equipment Recommendations Rolling walker (2 wheels) (shower chair)  Recommendations for Other Services       Functional Status Assessment Patient has had a recent decline in their functional status and demonstrates the ability to make significant improvements in function in a reasonable and predictable amount of time.     Precautions / Restrictions Precautions Precautions: Fall;Back Precaution Booklet Issued: Yes (comment) Required Braces or Orthoses:  Spinal Brace Spinal Brace: Lumbar corset;Applied in sitting position Restrictions Weight Bearing Restrictions: No      Mobility  Bed Mobility               General bed mobility comments: pt sitting EOB upon arrival    Transfers Overall transfer level: Needs assistance Equipment used: Rolling walker (2 wheels), None Transfers: Sit to/from Stand Sit to Stand: Contact guard assist           General transfer comment: CGA for safety, no overt LOB    Ambulation/Gait Ambulation/Gait assistance: Contact guard assist Gait Distance (Feet): 250 Feet Assistive device: 1 person hand held assist, None, Rolling walker (2 wheels) Gait Pattern/deviations: Step-to pattern, Step-through pattern, Knee flexed in stance - left, Knee hyperextension - left, Antalgic Gait velocity: decreased Gait velocity interpretation: <1.31 ft/sec, indicative of household ambulator   General Gait Details: pt with decreased wt shift to L and increased knee instability with fatigue. Pt progressing to need for BUE support due to pain and fatigue. recommend RW  Stairs Stairs: Yes Stairs assistance: Min assist Stair Management: One rail Right, Step to pattern, Forwards Number of Stairs: 5 General stair comments: up with RLE, hand on rail    Balance Overall balance assessment: Mild deficits observed, not formally tested                                           Pertinent Vitals/Pain Pain Assessment Pain Assessment: Faces Faces Pain Scale: Hurts a little bit Pain Location: incision, LLE Pain Descriptors / Indicators: Discomfort, Grimacing, Guarding, Sore Pain Intervention(s): Limited activity within patient's tolerance, Monitored  during session, Repositioned    Home Living Family/patient expects to be discharged to:: Private residence Living Arrangements: Spouse/significant other;Children Available Help at Discharge: Family;Available 24 hours/day Type of Home: House Home  Access: Level entry     Alternate Level Stairs-Number of Steps: 20 (10 + 10) Home Layout: Two level;Bed/bath upstairs Home Equipment: None Additional Comments: 10+10 stairs to get to bedroom/bathroom. rail on R for first set then L`    Prior Function Prior Level of Function : Independent/Modified Independent             Mobility Comments: no use of DME at home, independent, attending gym ADLs Comments: independent     Extremity/Trunk Assessment   Upper Extremity Assessment Upper Extremity Assessment: Defer to OT evaluation    Lower Extremity Assessment Lower Extremity Assessment: RLE deficits/detail;LLE deficits/detail RLE Deficits / Details: reports was more painful before surgery, now [facility] RLE Sensation: WNL RLE Coordination: WNL LLE Deficits / Details: reports pain from incision down back of thigh. increased instability when fatigued but no overt buckling. LLE Sensation: decreased light touch LLE Coordination: WNL    Cervical / Trunk Assessment Cervical / Trunk Assessment: Back Surgery  Communication   Communication Communication: No apparent difficulties Cueing Techniques: Verbal cues  Cognition Arousal: Alert Behavior During Therapy: WFL for tasks assessed/performed Overall Cognitive Status: Within Functional Limits for tasks assessed                                          General Comments General comments (skin integrity, edema, etc.): VSS oN RA    Exercises     Assessment/Plan    PT Assessment Patient needs continued PT services  PT Problem List Decreased strength;Decreased range of motion;Decreased activity tolerance;Decreased balance;Decreased mobility;Pain       PT Treatment Interventions Gait training;DME instruction;Stair training;Functional mobility training;Therapeutic exercise;Therapeutic activities;Balance training;Patient/family education    PT Goals (Current goals can be found in the Care Plan section)  Acute Rehab PT  Goals Patient Stated Goal: return home PT Goal Formulation: With patient Time For Goal Achievement: 05/21/23 Potential to Achieve Goals: Good    Frequency Min 5X/week        AM-PAC PT "6 Clicks" Mobility  Outcome Measure Help needed turning from your back to your side while in a flat bed without using bedrails?: A Little Help needed moving from lying on your back to sitting on the side of a flat bed without using bedrails?: A Little Help needed moving to and from a bed to a chair (including a wheelchair)?: A Little Help needed standing up from a chair using your arms (e.g., wheelchair or bedside chair)?: A Little Help needed to walk in hospital room?: A Little Help needed climbing 3-5 steps with a railing? : A Little 6 Click Score: 18    End of Session Equipment Utilized During Treatment: Gait belt;Back brace Activity Tolerance: Patient tolerated treatment well Patient left: in bed;with call bell/phone within reach (sitting EOB) Nurse Communication: Mobility status PT Visit Diagnosis: Unsteadiness on feet (R26.81);Other abnormalities of gait and mobility (R26.89);Muscle weakness (generalized) (M62.81);Pain Pain - Right/Left: Left Pain - part of body: Leg    Time: 0759-0829 PT Time Calculation (min) (ACUTE ONLY): 30 min   Charges:   PT Evaluation $PT Eval Low Complexity: 1 Low PT Treatments $Therapeutic Activity: 8-22 mins PT General Charges $$ ACUTE PT VISIT: 1 Visit  Vickki Muff, PT, DPT   Acute Rehabilitation Department Office [phone] Secure Chat Communication Preferred  Ronnie Derby 05/07/2023, 9:13 AM

## 2023-05-12 ENCOUNTER — Encounter (HOSPITAL_COMMUNITY): Payer: Self-pay | Admitting: Orthopedic Surgery

## 2023-06-03 NOTE — Discharge Summary (Signed)
Patient ID: Alexis Hartman MRN: [id number] DOB/AGE: 06-Mar-1966 57 y.o.  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 female who was admitted 05/06/2023 for operative treatment of Radiculopathy, lumbar region. Patient has severe unremitting pain that affects sleep, daily activities, and work/hobbies. After pre-op clearance the patient was taken to the operating room on 05/06/2023 and underwent  Procedure(s): LEFT-SIDED LUMBAR 5 - SACRUM 1 TRANSFORAMINAL LUMBAR INTERBODY FUSION AND DECOMPRESSION WITH INSTRUMENTATION AND ALLOGRAFT.    Patient was given perioperative antibiotics:  Anti-infectives (From admission, onward)    Start     Dose/Rate Route Frequency Ordered Stop   05/06/23 1730  ceFAZolin (ANCEF) IVPB 2g/100 mL premix        2 g 200 mL/hr over 30 Minutes Intravenous Every 8 hours 05/06/23 1335 05/07/23 1006   05/06/23 0700  ceFAZolin (ANCEF) IVPB 2g/100 mL premix        2 g 200 mL/hr over 30 Minutes Intravenous On call to O.R. 05/06/23 4098 05/06/23 0915        Patient was given sequential compression devices, early ambulation to prevent DVT.  Patient benefited maximally from hospital stay and there were no  complications.    Recent vital signs: BP (!) 151/81 (BP Location: Right Arm)   Pulse 80   Temp 98.4 F (36.9 C) (Oral)   Resp 16   Ht 5\' 10"  (1.778 m)   Wt 88.2 kg   SpO2 95%   BMI 27.91 kg/m    Discharge Medications:   Allergies as of 05/07/2023       Reactions   Statins Other (See Comments)   Atorvastatin, rosuvastatin - muscle pain        Medication List     TAKE these medications    albuterol 108 (90 Base) MCG/ACT inhaler Commonly known as: VENTOLIN HFA Inhale 2 puffs into the lungs every 6 (six) hours as needed for wheezing or shortness of breath.   clobetasol cream 0.05 % Commonly known as: TEMOVATE Apply 1 Application topically 2 (two) times daily. Do not use for more than 14 days at a time   methocarbamol 500 MG tablet Commonly known as: ROBAXIN Take 500-1,000 mg by mouth every 6 (six) hours as needed for muscle spasms.   multivitamin tablet Take 1 tablet by mouth daily.   OMEGA 3 PO Take 1 capsule by mouth daily.   oxyCODONE-acetaminophen 5-325 MG tablet Commonly known as: PERCOCET/ROXICET Take 1-2 tablets by mouth every 4 (four) hours as needed for severe pain (pain score 7-10).        Diagnostic Studies: DG Lumbar Spine 1 View  Result Date: 05/06/2023 CLINICAL DATA:  Elective surgery.  Intraop localization. EXAM: LUMBAR SPINE - 1 VIEW COMPARISON:  None Available. FINDINGS: Single cross-table lateral view of the lumbar spine obtained in the operating room. Surgical instruments localize posteriorly at the L4-L5 and upper S1 level. IMPRESSION: Intraoperative localization during lumbar surgery. Electronically Signed   By: Narda Rutherford M.D.   On: 05/06/2023 15:43   DG Lumbar Spine 2-3 Views  Result Date: 05/06/2023 CLINICAL DATA:  Elective surgery. EXAM: LUMBAR SPINE - 2-3 VIEW COMPARISON:  None Available. FINDINGS: Two fluoroscopic spot views of the lumbar spine obtained in the operating room. Posterior rod and intrapedicular screw fusion with  interbody spacer at L5-S1. Fluoroscopy time 58 seconds. Dose 36.49 mGy. IMPRESSION: Intraoperative fluoroscopy during L5-S1 fusion. Electronically Signed   By: Narda Rutherford M.D.   On: 05/06/2023 15:42   DG C-Arm 1-60 Min-No Report  Result Date: 05/06/2023 Fluoroscopy was utilized by the requesting physician.  No radiographic interpretation.   DG C-Arm 1-60 Min-No Report  Result Date: 05/06/2023 Fluoroscopy was utilized by the requesting physician.  No radiographic interpretation.   DG C-Arm 1-60 Min-No Report  Result Date: 05/06/2023 Fluoroscopy was utilized by the requesting physician.  No radiographic interpretation.    Disposition: Discharge disposition: 01-Home or Self Care        POD #1 s/p L5/S1 decompression and fusion, doing well   - up with PT/OT, encourage ambulation - Percocet for pain, Robaxin for muscle spasms -Scripts for pain sent to pharmacy electronically  -D/C instructions sheet printed and in chart -D/C today  -F/U in office 2 weeks   Signed: Eilene Ghazi Alexis Hartman 06/03/2023, 1:01 PM

## 2023-10-19 ENCOUNTER — Encounter: Payer: Self-pay | Admitting: Internal Medicine

## 2023-10-19 NOTE — Patient Instructions (Addendum)
      Blood work was ordered.       Medications changes include :   None   An Echocardiogram was ordered - someone will call you to schedule this   Return in about 6 months (around 04/20/2024) for Physical Exam.

## 2023-10-19 NOTE — Progress Notes (Signed)
 Subjective:    Patient ID: Alexis Hartman, female    DOB: 1966/06/22, 58 y.o.   MRN: [id number]     HPI Biviana is here for follow up of her chronic medical problems.  In November she had L5-S1 fusion.  Headache for the pain couple of days - frontal region.  ? Sinus.  Has some under eye puffiness. Some sinus issues.    Left biceps pain since having blood for pre-op.    Medications and allergies reviewed with patient and updated if appropriate.  Current Outpatient Medications on File Prior to Visit  Medication Sig Dispense Refill   methocarbamol  (ROBAXIN ) 500 MG tablet Take 500-1,000 mg by mouth every 6 (six) hours as needed for muscle spasms.     Multiple Vitamin (MULTIVITAMIN) tablet Take 1 tablet by mouth daily.     Omega-3 Fatty Acids (OMEGA 3 PO) Take 1 capsule by mouth daily.     No current facility-administered medications on file prior to visit.     Review of Systems  Constitutional:  Negative for fever.  HENT:  Positive for congestion (in morning).   Respiratory:  Positive for cough (intermittent - brings up a little mucus). Negative for shortness of breath and wheezing.   Cardiovascular:  Negative for chest pain, palpitations and leg swelling.  Neurological:  Positive for light-headedness (occ - last week had it once) and headaches (occ). Negative for dizziness.       Objective:   Vitals:   10/20/23 0746  BP: 124/72  Pulse: 78  Temp: 97.8 F (36.6 C)  SpO2: 97%   BP Readings from Last 3 Encounters:  10/20/23 124/72  05/07/23 (!) 151/81  04/30/23 (!) 162/83   Wt Readings from Last 3 Encounters:  10/20/23 199 lb (90.3 kg)  05/06/23 194 lb 8 oz (88.2 kg)  04/30/23 194 lb 8 oz (88.2 kg)   Body mass index is 28.55 kg/m.    Physical Exam Constitutional:      General: She is not in acute distress.    Appearance: Normal appearance.  HENT:     Head: Normocephalic and atraumatic.  Eyes:     Conjunctiva/sclera: Conjunctivae normal.   Cardiovascular:     Rate and Rhythm: Normal rate and regular rhythm.     Heart sounds: Murmur (2/6 systolic) heard.  Pulmonary:     Effort: Pulmonary effort is normal. No respiratory distress.     Breath sounds: Normal breath sounds. No wheezing.  Musculoskeletal:     Cervical back: Neck supple.     Right lower leg: No edema.     Left lower leg: No edema.  Lymphadenopathy:     Cervical: No cervical adenopathy.  Skin:    General: Skin is warm and dry.     Findings: No rash.  Neurological:     Mental Status: She is alert. Mental status is at baseline.  Psychiatric:        Mood and Affect: Mood normal.        Behavior: Behavior normal.        Lab Results  Component Value Date   WBC 5.8 04/21/2023   HGB 12.4 04/21/2023   HCT 38.7 04/21/2023   PLT 238.0 04/21/2023   GLUCOSE 94 04/30/2023   CHOL 234 (H) 04/21/2023   TRIG 62.0 04/21/2023   HDL 84.70 04/21/2023   LDLDIRECT 164.7 05/13/2013   LDLCALC 136 (H) 04/21/2023   ALT 18 04/21/2023   AST 28 04/21/2023   NA 139  04/30/2023   K 4.3 04/30/2023   CL 108 04/30/2023   CREATININE 0.94 04/30/2023   BUN 17 04/30/2023   CO2 25 04/30/2023   TSH 1.32 04/21/2023   INR 1.04 01/05/2017   HGBA1C 6.2 04/21/2023   MICROALBUR <0.7 04/21/2023     Assessment & Plan:    See Problem List for Assessment and Plan of chronic medical problems.    I spent 20 minutes dedicated to the care of this patient on the date of this encounter including review of recent procedures, speciality notes, obtaining history, communicating with the patient, ordering tests, and documenting clinical information in the EHR

## 2023-10-20 ENCOUNTER — Ambulatory Visit (INDEPENDENT_AMBULATORY_CARE_PROVIDER_SITE_OTHER): Payer: 59 | Admitting: Internal Medicine

## 2023-10-20 ENCOUNTER — Encounter: Payer: Self-pay | Admitting: Internal Medicine

## 2023-10-20 VITALS — BP 124/72 | HR 78 | Temp 97.8°F | Ht 70.0 in | Wt 199.0 lb

## 2023-10-20 DIAGNOSIS — E7800 Pure hypercholesterolemia, unspecified: Secondary | ICD-10-CM | POA: Diagnosis not present

## 2023-10-20 DIAGNOSIS — M5416 Radiculopathy, lumbar region: Secondary | ICD-10-CM | POA: Diagnosis not present

## 2023-10-20 DIAGNOSIS — E1169 Type 2 diabetes mellitus with other specified complication: Secondary | ICD-10-CM | POA: Diagnosis not present

## 2023-10-20 DIAGNOSIS — R011 Cardiac murmur, unspecified: Secondary | ICD-10-CM | POA: Insufficient documentation

## 2023-10-20 DIAGNOSIS — M791 Myalgia, unspecified site: Secondary | ICD-10-CM

## 2023-10-20 DIAGNOSIS — L309 Dermatitis, unspecified: Secondary | ICD-10-CM | POA: Insufficient documentation

## 2023-10-20 LAB — COMPREHENSIVE METABOLIC PANEL WITH GFR
ALT: 20 U/L (ref 0–35)
AST: 28 U/L (ref 0–37)
Albumin: 4.7 g/dL (ref 3.5–5.2)
Alkaline Phosphatase: 67 U/L (ref 39–117)
BUN: 17 mg/dL (ref 6–23)
CO2: 29 meq/L (ref 19–32)
Calcium: 10.1 mg/dL (ref 8.4–10.5)
Chloride: 104 meq/L (ref 96–112)
Creatinine, Ser: 1 mg/dL (ref 0.40–1.20)
GFR: 62.32 mL/min (ref >60.00)
Glucose, Bld: 95 mg/dL (ref 70–99)
Potassium: 4.6 meq/L (ref 3.5–5.1)
Sodium: 140 meq/L (ref 135–145)
Total Bilirubin: 0.4 mg/dL (ref 0.2–1.2)
Total Protein: 7.9 g/dL (ref 6.0–8.3)

## 2023-10-20 LAB — LIPID PANEL
Cholesterol: 258 mg/dL — ABNORMAL HIGH (ref 0–200)
HDL: 87.1 mg/dL (ref >39.00)
LDL Cholesterol: 158 mg/dL — ABNORMAL HIGH (ref 0–99)
NonHDL: 170.76
Total CHOL/HDL Ratio: 3
Triglycerides: 63 mg/dL (ref 0.0–149.0)
VLDL: 12.6 mg/dL (ref 0.0–40.0)

## 2023-10-20 LAB — HEMOGLOBIN A1C: Hgb A1c MFr Bld: 6.3 % (ref 4.6–6.5)

## 2023-10-20 NOTE — Assessment & Plan Note (Signed)
Chronic Intolerant of statin secondary to myalgia

## 2023-10-20 NOTE — Assessment & Plan Note (Signed)
 Chronic Probable eczema On hands and feet She is moisturizing regularly - using aveno Clobetasol  did not help Avoid too much water exposure

## 2023-10-20 NOTE — Assessment & Plan Note (Signed)
 Chronic 2/6 sys murmur Echo ordered

## 2023-10-20 NOTE — Assessment & Plan Note (Signed)
 Chronic S/p L5-S1 fusion 05/2023 by Dr Jackee Marus

## 2023-10-20 NOTE — Assessment & Plan Note (Signed)
 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

## 2023-10-20 NOTE — Assessment & Plan Note (Signed)
 Chronic Controlled with diet  Lab Results  Component Value Date   HGBA1C 6.2 04/21/2023   Check A1c, CMP Continue diabetic diet and regular exercise

## 2023-10-28 ENCOUNTER — Ambulatory Visit (INDEPENDENT_AMBULATORY_CARE_PROVIDER_SITE_OTHER): Admitting: Podiatry

## 2023-10-28 ENCOUNTER — Encounter: Payer: Self-pay | Admitting: Podiatry

## 2023-10-28 ENCOUNTER — Encounter: Payer: Self-pay | Admitting: Internal Medicine

## 2023-10-28 DIAGNOSIS — M79675 Pain in left toe(s): Secondary | ICD-10-CM | POA: Diagnosis not present

## 2023-10-28 DIAGNOSIS — M79674 Pain in right toe(s): Secondary | ICD-10-CM | POA: Diagnosis not present

## 2023-10-28 DIAGNOSIS — G63 Polyneuropathy in diseases classified elsewhere: Secondary | ICD-10-CM

## 2023-10-28 DIAGNOSIS — B351 Tinea unguium: Secondary | ICD-10-CM | POA: Diagnosis not present

## 2023-10-28 NOTE — Progress Notes (Signed)
  Subjective:  Patient ID: Alexis Hartman, female    DOB: February 02, 1966,   MRN: [id number]  No chief complaint on file.   58 y.o. female presents for follow-up of neuropathy and fungal toenails. She has been using the penlac .  Last A1c was 6.4.   Denies any other pedal complaints. Denies n/v/f/c.   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 CFT <3 seconds. Normal hair growth on digits. No edema.  Skin. No lacerations or abrasions bilateral feet. Right hallux nail thickened and discolored with subungual debris distally.  Musculoskeletal: MMT 5/5 bilateral lower extremities in DF, PF, Inversion and Eversion. Deceased ROM in DF of ankle joint.  Neurological: Sensation intact to light touch. Protective sensation diminished.   Assessment:   1. Pain due to onychomycosis of toenails of both feet   2. Polyneuropathy associated with underlying disease (HCC)      Plan:  Patient was evaluated and treated and all questions answered. Discussed neuropathy and etiology as well as treatment with patient.  Radiographs reviewed and discussed with patient.  -Discussed and educated patient on diabetic foot care, especially with  regards to the vascular, neurological and musculoskeletal systems.  -Stressed the importance of good glycemic control and the detriment of not  controlling glucose levels in relation to the foot. -Discussed supportive shoes at all times and checking feet regularly.  -Nails 1-5 b/l debrided without incident.  -Return in 3 months for rfc.     Jennefer Moats, DPM

## 2023-11-30 ENCOUNTER — Ambulatory Visit: Admitting: Podiatry

## 2023-12-15 ENCOUNTER — Ambulatory Visit (HOSPITAL_COMMUNITY)
Admission: RE | Admit: 2023-12-15 | Discharge: 2023-12-15 | Disposition: A | Discharge status: Home / Self Care | Source: Ambulatory Visit | Attending: Cardiovascular Disease | Admitting: Cardiovascular Disease

## 2023-12-15 DIAGNOSIS — R011 Cardiac murmur, unspecified: Secondary | ICD-10-CM | POA: Diagnosis present

## 2023-12-15 LAB — ECHOCARDIOGRAM COMPLETE
Area-P 1/2: 3.17 cm2
S' Lateral: 2.8 cm

## 2023-12-17 ENCOUNTER — Ambulatory Visit: Payer: Self-pay | Admitting: Internal Medicine

## 2023-12-17 DIAGNOSIS — I080 Rheumatic disorders of both mitral and aortic valves: Secondary | ICD-10-CM | POA: Insufficient documentation

## 2024-01-27 ENCOUNTER — Ambulatory Visit: Admitting: Podiatry

## 2024-04-24 ENCOUNTER — Encounter: Payer: Self-pay | Admitting: Internal Medicine

## 2024-04-24 DIAGNOSIS — E559 Vitamin D deficiency, unspecified: Secondary | ICD-10-CM | POA: Insufficient documentation

## 2024-04-24 NOTE — Progress Notes (Unsigned)
 Subjective:    Patient ID: Alexis Hartman, female    DOB: 1965-12-03, 58 y.o.   MRN: [id number]      HPI Alexis Hartman is here for a Physical exam and her chronic medical problems.   Doing well.  No concerns.     Medications and allergies reviewed with patient and updated if appropriate.  Current Outpatient Medications on File Prior to Visit  Medication Sig Dispense Refill   methocarbamol  (ROBAXIN ) 500 MG tablet Take 500-1,000 mg by mouth every 6 (six) hours as needed for muscle spasms.     Multiple Vitamin (MULTIVITAMIN) tablet Take 1 tablet by mouth daily.     Omega-3 Fatty Acids (OMEGA 3 PO) Take 1 capsule by mouth daily.     No current facility-administered medications on file prior to visit.    Review of Systems  Constitutional:  Negative for fever.  HENT:  Positive for congestion and postnasal drip.   Eyes:  Negative for visual disturbance.  Respiratory:  Positive for wheezing. Negative for cough and shortness of breath.   Cardiovascular:  Negative for chest pain, palpitations and leg swelling.  Gastrointestinal:  Negative for abdominal pain, blood in stool, constipation and diarrhea.       Occ gerd  Genitourinary:  Negative for dysuria.  Musculoskeletal:  Positive for arthralgias (mild) and back pain (minimal).  Skin:  Negative for rash.  Neurological:  Positive for headaches (occ). Negative for light-headedness.  Psychiatric/Behavioral:  Negative for dysphoric mood. The patient is not nervous/anxious.        Objective:   Vitals:   04/26/24 0739  BP: 130/82  Pulse: (!) 56  Temp: 97.7 F (36.5 C)  SpO2: 99%   Filed Weights   04/26/24 0739  Weight: 197 lb 12.8 oz (89.7 kg)   Body mass index is 28.38 kg/m.  BP Readings from Last 3 Encounters:  04/26/24 130/82  10/20/23 124/72  05/07/23 (!) 151/81    Wt Readings from Last 3 Encounters:  04/26/24 197 lb 12.8 oz (89.7 kg)  10/20/23 199 lb (90.3 kg)  05/06/23 194 lb 8 oz (88.2 kg)       Physical  Exam Constitutional: She appears well-developed and well-nourished. No distress.  HENT:  Head: Normocephalic and atraumatic.  Right Ear: External ear normal. Normal ear canal and TM Left Ear: External ear normal.  Normal ear canal and TM Mouth/Throat: Oropharynx is clear and moist.  Eyes: Conjunctivae normal.  Neck: Neck supple. No tracheal deviation present. No thyromegaly present.  No carotid bruit  Cardiovascular: Normal rate, regular rhythm and normal heart sounds.   No murmur heard.  No edema. Pulmonary/Chest: Effort normal and breath sounds normal. No respiratory distress. She has no wheezes. She has no rales.  Breast: deferred   Abdominal: Soft. She exhibits no distension. There is no tenderness.  Lymphadenopathy: She has no cervical adenopathy.  Skin: Skin is warm and dry. She is not diaphoretic.  Psychiatric: She has a normal mood and affect. Her behavior is normal.     Lab Results  Component Value Date   WBC 5.8 04/21/2023   HGB 12.4 04/21/2023   HCT 38.7 04/21/2023   PLT 238.0 04/21/2023   GLUCOSE 95 10/20/2023   CHOL 258 (H) 10/20/2023   TRIG 63.0 10/20/2023   HDL 87.10 10/20/2023   LDLDIRECT 164.7 05/13/2013   LDLCALC 158 (H) 10/20/2023   ALT 20 10/20/2023   AST 28 10/20/2023   NA 140 10/20/2023   K 4.6 10/20/2023  CL 104 10/20/2023   CREATININE 1.00 10/20/2023   BUN 17 10/20/2023   CO2 29 10/20/2023   TSH 1.32 04/21/2023   INR 1.04 01/05/2017   HGBA1C 6.3 10/20/2023   MICROALBUR 0.6 03/13/2020    The 10-year ASCVD risk score (Arnett DK, et al., 2019) is: 9.5%   Values used to calculate the score:     Age: 67 years     Clincally relevant sex: Female     Is Non-Hispanic African American: Yes     Diabetic: Yes     Tobacco smoker: No     Systolic Blood Pressure: 130 mmHg     Is BP treated: No     HDL Cholesterol: 87.1 mg/dL     Total Cholesterol: 258 mg/dL      Assessment & Plan:   Physical exam: Screening blood work  ordered Exercise   regular Weight  good Substance abuse  none Diet is good.    Reviewed recommended immunizations.  Flu and prevnar 20 vaccine given.   Health Maintenance  Topic Date Due   OPHTHALMOLOGY EXAM  Never done   Hepatitis B Vaccines 19-59 Average Risk (1 of 3 - 19+ 3-dose series) Never done   Cervical Cancer Screening (HPV/Pap Cotest)  Never done   Pneumococcal Vaccine: 50+ Years (2 of 2 - PCV) 09/13/2019   Diabetic kidney evaluation - Urine ACR  03/13/2021   Influenza Vaccine  01/29/2024   COVID-19 Vaccine (1 - 2025-26 season) Never done   HEMOGLOBIN A1C  04/20/2024   Mammogram  04/23/2024   HIV Screening  12/05/2028 (Originally 10/16/1980)   Diabetic kidney evaluation - eGFR measurement  10/19/2024   FOOT EXAM  10/27/2024   DTaP/Tdap/Td (2 - Td or Tdap) 11/29/2026   Colonoscopy  03/16/2028   Hepatitis C Screening  Completed   Zoster Vaccines- Shingrix   Completed   HPV VACCINES  Aged Out   Meningococcal B Vaccine  Aged Out          See Problem List for Assessment and Plan of chronic medical problems.

## 2024-04-24 NOTE — Patient Instructions (Addendum)
 Flu and pneumonia vaccines given   Blood work was ordered.       Medications changes include :   None    Return in about 6 months (around 10/25/2024) for follow up.    Health Maintenance, Female Adopting a healthy lifestyle and getting preventive care are important in promoting health and wellness. Ask your health care provider about: The right schedule for you to have regular tests and exams. Things you can do on your own to prevent diseases and keep yourself healthy. What should I know about diet, weight, and exercise? Eat a healthy diet  Eat a diet that includes plenty of vegetables, fruits, low-fat dairy products, and lean protein. Do not eat a lot of foods that are high in solid fats, added sugars, or sodium. Maintain a healthy weight Body mass index (BMI) is used to identify weight problems. It estimates body fat based on height and weight. Your health care provider can help determine your BMI and help you achieve or maintain a healthy weight. Get regular exercise Get regular exercise. This is one of the most important things you can do for your health. Most adults should: Exercise for at least 150 minutes each week. The exercise should increase your heart rate and make you sweat (moderate-intensity exercise). Do strengthening exercises at least twice a week. This is in addition to the moderate-intensity exercise. Spend less time sitting. Even light physical activity can be beneficial. Watch cholesterol and blood lipids Have your blood tested for lipids and cholesterol at 57 years of age, then have this test every 5 years. Have your cholesterol levels checked more often if: Your lipid or cholesterol levels are high. You are older than 58 years of age. You are at high risk for heart disease. What should I know about cancer screening? Depending on your health history and family history, you may need to have cancer screening at various ages. This may include screening  for: Breast cancer. Cervical cancer. Colorectal cancer. Skin cancer. Lung cancer. What should I know about heart disease, diabetes, and high blood pressure? Blood pressure and heart disease High blood pressure causes heart disease and increases the risk of stroke. This is more likely to develop in people who have high blood pressure readings or are overweight. Have your blood pressure checked: Every 3-5 years if you are 102-55 years of age. Every year if you are 31 years old or older. Diabetes Have regular diabetes screenings. This checks your fasting blood sugar level. Have the screening done: Once every three years after age 72 if you are at a normal weight and have a low risk for diabetes. More often and at a younger age if you are overweight or have a high risk for diabetes. What should I know about preventing infection? Hepatitis B If you have a higher risk for hepatitis B, you should be screened for this virus. Talk with your health care provider to find out if you are at risk for hepatitis B infection. Hepatitis C Testing is recommended for: Everyone born from [birth year] through 1965. Anyone with known risk factors for hepatitis C. Sexually transmitted infections (STIs) Get screened for STIs, including gonorrhea and chlamydia, if: You are sexually active and are younger than 58 years of age. You are older than 58 years of age and your health care provider tells you that you are at risk for this type of infection. Your sexual activity has changed since you were last screened, and you are at increased risk  for chlamydia or gonorrhea. Ask your health care provider if you are at risk. Ask your health care provider about whether you are at high risk for HIV. Your health care provider may recommend a prescription medicine to help prevent HIV infection. If you choose to take medicine to prevent HIV, you should first get tested for HIV. You should then be tested every 3 months for as long as you  are taking the medicine. Pregnancy If you are about to stop having your period (premenopausal) and you may become pregnant, seek counseling before you get pregnant. Take 400 to 800 micrograms (mcg) of folic acid every day if you become pregnant. Ask for birth control (contraception) if you want to prevent pregnancy. Osteoporosis and menopause Osteoporosis is a disease in which the bones lose minerals and strength with aging. This can result in bone fractures. If you are 69 years old or older, or if you are at risk for osteoporosis and fractures, ask your health care provider if you should: Be screened for bone loss. Take a calcium  or vitamin D  supplement to lower your risk of fractures. Be given hormone replacement therapy (HRT) to treat symptoms of menopause. Follow these instructions at home: Alcohol use Do not drink alcohol if: Your health care provider tells you not to drink. You are pregnant, may be pregnant, or are planning to become pregnant. If you drink alcohol: Limit how much you have to: 0-1 drink a day. Know how much alcohol is in your drink. In the [place] one drink equals one 12 oz bottle of beer (355 mL), one 5 oz glass of wine (148 mL), or one 1 oz glass of hard liquor (44 mL). Lifestyle Do not use any products that contain nicotine or tobacco. These products include cigarettes, chewing tobacco, and vaping devices, such as e-cigarettes. If you need help quitting, ask your health care provider. Do not use street drugs. Do not share needles. Ask your health care provider for help if you need support or information about quitting drugs. General instructions Schedule regular health, dental, and eye exams. Stay current with your vaccines. Tell your health care provider if: You often feel depressed. You have ever been abused or do not feel safe at home. Summary Adopting a healthy lifestyle and getting preventive care are important in promoting health and wellness. Follow your  health care provider's instructions about healthy diet, exercising, and getting tested or screened for diseases. Follow your health care provider's instructions on monitoring your cholesterol and blood pressure. This information is not intended to replace advice given to you by your health care provider. Make sure you discuss any questions you have with your health care provider. Document Revised: 11/05/2020 Document Reviewed: 11/05/2020 Elsevier Patient Education  2024 [employer].

## 2024-04-26 ENCOUNTER — Ambulatory Visit (INDEPENDENT_AMBULATORY_CARE_PROVIDER_SITE_OTHER): Admitting: Internal Medicine

## 2024-04-26 VITALS — BP 130/82 | HR 56 | Temp 97.7°F | Ht 70.0 in | Wt 197.8 lb

## 2024-04-26 DIAGNOSIS — E559 Vitamin D deficiency, unspecified: Secondary | ICD-10-CM

## 2024-04-26 DIAGNOSIS — E785 Hyperlipidemia, unspecified: Secondary | ICD-10-CM | POA: Diagnosis not present

## 2024-04-26 DIAGNOSIS — M791 Myalgia, unspecified site: Secondary | ICD-10-CM

## 2024-04-26 DIAGNOSIS — Z23 Encounter for immunization: Secondary | ICD-10-CM

## 2024-04-26 DIAGNOSIS — E1169 Type 2 diabetes mellitus with other specified complication: Secondary | ICD-10-CM

## 2024-04-26 DIAGNOSIS — Z0000 Encounter for general adult medical examination without abnormal findings: Secondary | ICD-10-CM

## 2024-04-26 DIAGNOSIS — K5790 Diverticulosis of intestine, part unspecified, without perforation or abscess without bleeding: Secondary | ICD-10-CM | POA: Insufficient documentation

## 2024-04-26 DIAGNOSIS — T466X5A Adverse effect of antihyperlipidemic and antiarteriosclerotic drugs, initial encounter: Secondary | ICD-10-CM

## 2024-04-26 LAB — CBC WITH DIFFERENTIAL/PLATELET
Basophils Absolute: 0 K/uL (ref 0.0–0.1)
Basophils Relative: 0.8 % (ref 0.0–3.0)
Eosinophils Absolute: 0.1 K/uL (ref 0.0–0.7)
Eosinophils Relative: 1.4 % (ref 0.0–5.0)
HCT: 40.9 % (ref 36.0–46.0)
Hemoglobin: 13.4 g/dL (ref 12.0–15.0)
Lymphocytes Relative: 49.8 % — ABNORMAL HIGH (ref 12.0–46.0)
Lymphs Abs: 2.9 K/uL (ref 0.7–4.0)
MCHC: 32.9 g/dL (ref 30.0–36.0)
MCV: 86.9 fl (ref 78.0–100.0)
Monocytes Absolute: 0.4 K/uL (ref 0.1–1.0)
Monocytes Relative: 6.6 % (ref 3.0–12.0)
Neutro Abs: 2.4 K/uL (ref 1.4–7.7)
Neutrophils Relative %: 41.4 % — ABNORMAL LOW (ref 43.0–77.0)
Platelets: 252 K/uL (ref 150.0–400.0)
RBC: 4.7 Mil/uL (ref 3.87–5.11)
RDW: 13.8 % (ref 11.5–15.5)
WBC: 5.9 K/uL (ref 4.0–10.5)

## 2024-04-26 LAB — COMPREHENSIVE METABOLIC PANEL WITH GFR
ALT: 15 U/L (ref 0–35)
AST: 26 U/L (ref 0–37)
Albumin: 4.7 g/dL (ref 3.5–5.2)
Alkaline Phosphatase: 66 U/L (ref 39–117)
BUN: 17 mg/dL (ref 6–23)
CO2: 28 meq/L (ref 19–32)
Calcium: 9.8 mg/dL (ref 8.4–10.5)
Chloride: 103 meq/L (ref 96–112)
Creatinine, Ser: 0.95 mg/dL (ref 0.40–1.20)
GFR: 66.03 mL/min (ref >60.00)
Glucose, Bld: 98 mg/dL (ref 70–99)
Potassium: 3.9 meq/L (ref 3.5–5.1)
Sodium: 140 meq/L (ref 135–145)
Total Bilirubin: 0.5 mg/dL (ref 0.2–1.2)
Total Protein: 8 g/dL (ref 6.0–8.3)

## 2024-04-26 LAB — LIPID PANEL
Cholesterol: 251 mg/dL — ABNORMAL HIGH (ref 0–200)
HDL: 81.6 mg/dL (ref >39.00)
LDL Cholesterol: 156 mg/dL — ABNORMAL HIGH (ref 0–99)
NonHDL: 169.11
Total CHOL/HDL Ratio: 3
Triglycerides: 66 mg/dL (ref 0.0–149.0)
VLDL: 13.2 mg/dL (ref 0.0–40.0)

## 2024-04-26 LAB — VITAMIN D 25 HYDROXY (VIT D DEFICIENCY, FRACTURES): VITD: 30.33 ng/mL (ref 30.00–100.00)

## 2024-04-26 LAB — MICROALBUMIN / CREATININE URINE RATIO
Creatinine,U: 339.3 mg/dL
Microalb Creat Ratio: 4.2 mg/g (ref 0.0–30.0)
Microalb, Ur: 1.4 mg/dL (ref 0.0–1.9)

## 2024-04-26 LAB — HEMOGLOBIN A1C: Hgb A1c MFr Bld: 6.5 % (ref 4.6–6.5)

## 2024-04-26 LAB — TSH: TSH: 1.55 u[IU]/mL (ref 0.35–5.50)

## 2024-04-26 NOTE — Assessment & Plan Note (Signed)
 Chronic Associated with hyperlipidemia Controlled with diet  Lab Results  Component Value Date   HGBA1C 6.3 10/20/2023   Check A1c, CMP, urine albumin/creatinine ratio Continue diabetic diet and regular exercise

## 2024-04-26 NOTE — Assessment & Plan Note (Signed)
 Chronic Taking vitamin D daily Check vitamin D level

## 2024-04-26 NOTE — Addendum Note (Signed)
 Addended by: WONDA BETTER on: 04/26/2024 09:04 AM   Modules accepted: Orders

## 2024-04-26 NOTE — Assessment & Plan Note (Addendum)
 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

## 2024-04-26 NOTE — Assessment & Plan Note (Signed)
 Chronic Intolerant of statin secondary to myalgia Has tried atorvastatin , rosuvastatin  Cholesterol diet controlled

## 2024-05-01 ENCOUNTER — Ambulatory Visit: Payer: Self-pay | Admitting: Internal Medicine

## 2024-10-25 ENCOUNTER — Ambulatory Visit: Admitting: Internal Medicine
